# Patient Record
Sex: Male | Born: 1940
Health system: Southern US, Community
[De-identification: ages and names within clinical notes are randomized; demographics above are authoritative.]

## PROBLEM LIST (undated history)

## (undated) DIAGNOSIS — D126 Benign neoplasm of colon, unspecified: Secondary | ICD-10-CM

## (undated) DIAGNOSIS — C61 Malignant neoplasm of prostate: Secondary | ICD-10-CM

## (undated) DIAGNOSIS — I779 Disorder of arteries and arterioles, unspecified: Secondary | ICD-10-CM

## (undated) DIAGNOSIS — E785 Hyperlipidemia, unspecified: Secondary | ICD-10-CM

## (undated) DIAGNOSIS — M5030 Other cervical disc degeneration, unspecified cervical region: Secondary | ICD-10-CM

## (undated) DIAGNOSIS — I10 Essential (primary) hypertension: Secondary | ICD-10-CM

## (undated) DIAGNOSIS — M1990 Unspecified osteoarthritis, unspecified site: Secondary | ICD-10-CM

## (undated) DIAGNOSIS — M5136 Other intervertebral disc degeneration, lumbar region: Secondary | ICD-10-CM

## (undated) DIAGNOSIS — K5900 Constipation, unspecified: Secondary | ICD-10-CM

## (undated) DIAGNOSIS — J449 Chronic obstructive pulmonary disease, unspecified: Secondary | ICD-10-CM

## (undated) DIAGNOSIS — H3530 Unspecified macular degeneration: Secondary | ICD-10-CM

## (undated) DIAGNOSIS — I739 Peripheral vascular disease, unspecified: Secondary | ICD-10-CM

## (undated) DIAGNOSIS — C4491 Basal cell carcinoma of skin, unspecified: Secondary | ICD-10-CM

## (undated) DIAGNOSIS — M51369 Other intervertebral disc degeneration, lumbar region without mention of lumbar back pain or lower extremity pain: Secondary | ICD-10-CM

## (undated) DIAGNOSIS — C3490 Malignant neoplasm of unspecified part of unspecified bronchus or lung: Secondary | ICD-10-CM

## (undated) DIAGNOSIS — Z923 Personal history of irradiation: Secondary | ICD-10-CM

## (undated) DIAGNOSIS — T8859XA Other complications of anesthesia, initial encounter: Secondary | ICD-10-CM

## (undated) HISTORY — DX: Malignant neoplasm of unspecified part of unspecified bronchus or lung: C34.90

## (undated) HISTORY — DX: Disorder of arteries and arterioles, unspecified: I77.9

## (undated) HISTORY — DX: Basal cell carcinoma of skin, unspecified: C44.91

## (undated) HISTORY — PX: SKIN CANCER EXCISION: SHX779

## (undated) HISTORY — PX: TONSILLECTOMY: SUR1361

## (undated) HISTORY — DX: Hyperlipidemia, unspecified: E78.5

## (undated) HISTORY — PX: COLONOSCOPY: SHX174

## (undated) HISTORY — DX: Essential (primary) hypertension: I10

## (undated) HISTORY — PX: CATARACT EXTRACTION W/ INTRAOCULAR LENS  IMPLANT, BILATERAL: SHX1307

## (undated) HISTORY — PX: PROSTATE BIOPSY: SHX241

## (undated) HISTORY — DX: Peripheral vascular disease, unspecified: I73.9

## (undated) HISTORY — PX: NOSE SURGERY: SHX723

## (undated) HISTORY — DX: Benign neoplasm of colon, unspecified: D12.6

## (undated) HISTORY — DX: Unspecified macular degeneration: H35.30

---

## 2001-03-11 ENCOUNTER — Ambulatory Visit (HOSPITAL_COMMUNITY): Admission: RE | Admit: 2001-03-11 | Discharge: 2001-03-11 | Payer: Self-pay | Admitting: Internal Medicine

## 2001-04-10 ENCOUNTER — Ambulatory Visit (HOSPITAL_COMMUNITY): Admission: RE | Admit: 2001-04-10 | Discharge: 2001-04-10 | Payer: Self-pay | Admitting: Nephrology

## 2001-04-10 ENCOUNTER — Encounter: Payer: Self-pay | Admitting: Nephrology

## 2001-07-22 ENCOUNTER — Ambulatory Visit (HOSPITAL_COMMUNITY): Admission: RE | Admit: 2001-07-22 | Discharge: 2001-07-22 | Payer: Self-pay | Admitting: Ophthalmology

## 2001-12-11 ENCOUNTER — Other Ambulatory Visit: Admission: RE | Admit: 2001-12-11 | Discharge: 2001-12-11 | Payer: Self-pay | Admitting: Dermatology

## 2002-06-17 ENCOUNTER — Other Ambulatory Visit: Admission: RE | Admit: 2002-06-17 | Discharge: 2002-06-17 | Payer: Self-pay | Admitting: Dermatology

## 2002-08-20 ENCOUNTER — Encounter: Payer: Self-pay | Admitting: Otolaryngology

## 2002-08-20 ENCOUNTER — Ambulatory Visit (HOSPITAL_COMMUNITY): Admission: RE | Admit: 2002-08-20 | Discharge: 2002-08-20 | Payer: Self-pay | Admitting: Otolaryngology

## 2002-10-08 ENCOUNTER — Encounter: Payer: Self-pay | Admitting: Nephrology

## 2002-10-08 ENCOUNTER — Ambulatory Visit (HOSPITAL_COMMUNITY): Admission: RE | Admit: 2002-10-08 | Discharge: 2002-10-08 | Payer: Self-pay | Admitting: Nephrology

## 2003-11-01 ENCOUNTER — Other Ambulatory Visit: Admission: RE | Admit: 2003-11-01 | Discharge: 2003-11-01 | Payer: Self-pay | Admitting: Dermatology

## 2008-09-06 ENCOUNTER — Ambulatory Visit (HOSPITAL_COMMUNITY): Admission: RE | Admit: 2008-09-06 | Discharge: 2008-09-06 | Payer: Self-pay | Admitting: Internal Medicine

## 2009-09-09 ENCOUNTER — Ambulatory Visit (HOSPITAL_COMMUNITY): Admission: RE | Admit: 2009-09-09 | Discharge: 2009-09-09 | Payer: Self-pay | Admitting: Internal Medicine

## 2009-12-02 ENCOUNTER — Ambulatory Visit (HOSPITAL_COMMUNITY): Admission: RE | Admit: 2009-12-02 | Discharge: 2009-12-02 | Payer: Self-pay | Admitting: Internal Medicine

## 2010-04-17 ENCOUNTER — Encounter: Payer: Self-pay | Admitting: Internal Medicine

## 2013-04-20 DIAGNOSIS — Z09 Encounter for follow-up examination after completed treatment for conditions other than malignant neoplasm: Secondary | ICD-10-CM | POA: Diagnosis not present

## 2013-04-20 DIAGNOSIS — C3410 Malignant neoplasm of upper lobe, unspecified bronchus or lung: Secondary | ICD-10-CM | POA: Diagnosis not present

## 2013-04-20 DIAGNOSIS — C61 Malignant neoplasm of prostate: Secondary | ICD-10-CM | POA: Diagnosis not present

## 2013-04-23 DIAGNOSIS — C3410 Malignant neoplasm of upper lobe, unspecified bronchus or lung: Secondary | ICD-10-CM | POA: Diagnosis not present

## 2013-04-23 DIAGNOSIS — Z09 Encounter for follow-up examination after completed treatment for conditions other than malignant neoplasm: Secondary | ICD-10-CM | POA: Diagnosis not present

## 2013-04-30 DIAGNOSIS — C61 Malignant neoplasm of prostate: Secondary | ICD-10-CM | POA: Diagnosis not present

## 2013-06-11 DIAGNOSIS — C61 Malignant neoplasm of prostate: Secondary | ICD-10-CM | POA: Diagnosis not present

## 2013-07-16 DIAGNOSIS — C3490 Malignant neoplasm of unspecified part of unspecified bronchus or lung: Secondary | ICD-10-CM | POA: Diagnosis not present

## 2013-07-16 DIAGNOSIS — C61 Malignant neoplasm of prostate: Secondary | ICD-10-CM | POA: Diagnosis not present

## 2013-07-23 DIAGNOSIS — C3490 Malignant neoplasm of unspecified part of unspecified bronchus or lung: Secondary | ICD-10-CM | POA: Diagnosis not present

## 2013-07-23 DIAGNOSIS — Z87891 Personal history of nicotine dependence: Secondary | ICD-10-CM | POA: Diagnosis not present

## 2013-07-28 DIAGNOSIS — C3490 Malignant neoplasm of unspecified part of unspecified bronchus or lung: Secondary | ICD-10-CM | POA: Diagnosis not present

## 2013-10-15 DIAGNOSIS — C61 Malignant neoplasm of prostate: Secondary | ICD-10-CM | POA: Diagnosis not present

## 2013-10-27 DIAGNOSIS — C3490 Malignant neoplasm of unspecified part of unspecified bronchus or lung: Secondary | ICD-10-CM | POA: Diagnosis not present

## 2013-10-29 DIAGNOSIS — Z01818 Encounter for other preprocedural examination: Secondary | ICD-10-CM | POA: Diagnosis not present

## 2013-10-29 DIAGNOSIS — Z86010 Personal history of colonic polyps: Secondary | ICD-10-CM | POA: Diagnosis not present

## 2013-11-02 DIAGNOSIS — C3490 Malignant neoplasm of unspecified part of unspecified bronchus or lung: Secondary | ICD-10-CM | POA: Diagnosis not present

## 2013-11-02 DIAGNOSIS — J984 Other disorders of lung: Secondary | ICD-10-CM | POA: Diagnosis not present

## 2013-11-02 DIAGNOSIS — C61 Malignant neoplasm of prostate: Secondary | ICD-10-CM | POA: Diagnosis not present

## 2013-11-02 DIAGNOSIS — Z87891 Personal history of nicotine dependence: Secondary | ICD-10-CM | POA: Diagnosis not present

## 2013-11-05 DIAGNOSIS — Z09 Encounter for follow-up examination after completed treatment for conditions other than malignant neoplasm: Secondary | ICD-10-CM | POA: Diagnosis not present

## 2013-11-05 DIAGNOSIS — C3410 Malignant neoplasm of upper lobe, unspecified bronchus or lung: Secondary | ICD-10-CM | POA: Diagnosis not present

## 2013-12-04 DIAGNOSIS — Z86010 Personal history of colonic polyps: Secondary | ICD-10-CM | POA: Diagnosis not present

## 2013-12-04 DIAGNOSIS — K648 Other hemorrhoids: Secondary | ICD-10-CM | POA: Diagnosis not present

## 2013-12-04 DIAGNOSIS — D126 Benign neoplasm of colon, unspecified: Secondary | ICD-10-CM | POA: Diagnosis not present

## 2014-01-07 DIAGNOSIS — C61 Malignant neoplasm of prostate: Secondary | ICD-10-CM | POA: Diagnosis not present

## 2014-01-26 DIAGNOSIS — C3490 Malignant neoplasm of unspecified part of unspecified bronchus or lung: Secondary | ICD-10-CM | POA: Diagnosis not present

## 2014-01-26 DIAGNOSIS — C61 Malignant neoplasm of prostate: Secondary | ICD-10-CM | POA: Diagnosis not present

## 2014-02-08 DIAGNOSIS — M5412 Radiculopathy, cervical region: Secondary | ICD-10-CM | POA: Diagnosis not present

## 2014-02-08 DIAGNOSIS — M25511 Pain in right shoulder: Secondary | ICD-10-CM | POA: Diagnosis not present

## 2014-02-08 DIAGNOSIS — M7541 Impingement syndrome of right shoulder: Secondary | ICD-10-CM | POA: Diagnosis not present

## 2014-02-22 DIAGNOSIS — M7541 Impingement syndrome of right shoulder: Secondary | ICD-10-CM | POA: Diagnosis not present

## 2014-02-23 DIAGNOSIS — M545 Low back pain: Secondary | ICD-10-CM | POA: Diagnosis not present

## 2014-02-23 DIAGNOSIS — M4726 Other spondylosis with radiculopathy, lumbar region: Secondary | ICD-10-CM | POA: Diagnosis not present

## 2014-02-25 DIAGNOSIS — M5126 Other intervertebral disc displacement, lumbar region: Secondary | ICD-10-CM | POA: Diagnosis not present

## 2014-02-25 DIAGNOSIS — M545 Low back pain: Secondary | ICD-10-CM | POA: Diagnosis not present

## 2014-02-25 DIAGNOSIS — M9953 Intervertebral disc stenosis of neural canal of lumbar region: Secondary | ICD-10-CM | POA: Diagnosis not present

## 2014-02-25 DIAGNOSIS — M47816 Spondylosis without myelopathy or radiculopathy, lumbar region: Secondary | ICD-10-CM | POA: Diagnosis not present

## 2014-02-25 DIAGNOSIS — M5127 Other intervertebral disc displacement, lumbosacral region: Secondary | ICD-10-CM | POA: Diagnosis not present

## 2014-03-08 DIAGNOSIS — M4806 Spinal stenosis, lumbar region: Secondary | ICD-10-CM | POA: Diagnosis not present

## 2014-03-08 DIAGNOSIS — M5136 Other intervertebral disc degeneration, lumbar region: Secondary | ICD-10-CM | POA: Diagnosis not present

## 2014-03-08 DIAGNOSIS — M5416 Radiculopathy, lumbar region: Secondary | ICD-10-CM | POA: Diagnosis not present

## 2014-04-01 DIAGNOSIS — C61 Malignant neoplasm of prostate: Secondary | ICD-10-CM | POA: Diagnosis not present

## 2014-05-04 DIAGNOSIS — R609 Edema, unspecified: Secondary | ICD-10-CM | POA: Diagnosis not present

## 2014-05-04 DIAGNOSIS — I10 Essential (primary) hypertension: Secondary | ICD-10-CM | POA: Diagnosis not present

## 2014-05-04 DIAGNOSIS — I5040 Unspecified combined systolic (congestive) and diastolic (congestive) heart failure: Secondary | ICD-10-CM | POA: Diagnosis not present

## 2014-05-10 DIAGNOSIS — J449 Chronic obstructive pulmonary disease, unspecified: Secondary | ICD-10-CM | POA: Diagnosis not present

## 2014-05-10 DIAGNOSIS — C3490 Malignant neoplasm of unspecified part of unspecified bronchus or lung: Secondary | ICD-10-CM | POA: Diagnosis not present

## 2014-05-12 DIAGNOSIS — Z85118 Personal history of other malignant neoplasm of bronchus and lung: Secondary | ICD-10-CM | POA: Diagnosis not present

## 2014-05-12 DIAGNOSIS — J449 Chronic obstructive pulmonary disease, unspecified: Secondary | ICD-10-CM | POA: Diagnosis not present

## 2014-05-12 DIAGNOSIS — I1310 Hypertensive heart and chronic kidney disease without heart failure, with stage 1 through stage 4 chronic kidney disease, or unspecified chronic kidney disease: Secondary | ICD-10-CM | POA: Diagnosis not present

## 2014-05-12 DIAGNOSIS — R600 Localized edema: Secondary | ICD-10-CM | POA: Diagnosis not present

## 2014-05-12 DIAGNOSIS — Z720 Tobacco use: Secondary | ICD-10-CM | POA: Diagnosis not present

## 2014-05-18 DIAGNOSIS — C61 Malignant neoplasm of prostate: Secondary | ICD-10-CM | POA: Diagnosis not present

## 2014-05-26 DIAGNOSIS — Z7982 Long term (current) use of aspirin: Secondary | ICD-10-CM | POA: Diagnosis not present

## 2014-05-26 DIAGNOSIS — Z452 Encounter for adjustment and management of vascular access device: Secondary | ICD-10-CM | POA: Diagnosis not present

## 2014-05-26 DIAGNOSIS — Z85118 Personal history of other malignant neoplasm of bronchus and lung: Secondary | ICD-10-CM | POA: Diagnosis not present

## 2014-05-26 DIAGNOSIS — Z87891 Personal history of nicotine dependence: Secondary | ICD-10-CM | POA: Diagnosis not present

## 2014-05-26 DIAGNOSIS — Z9221 Personal history of antineoplastic chemotherapy: Secondary | ICD-10-CM | POA: Diagnosis not present

## 2014-05-26 DIAGNOSIS — K7689 Other specified diseases of liver: Secondary | ICD-10-CM | POA: Diagnosis not present

## 2014-05-27 DIAGNOSIS — C3411 Malignant neoplasm of upper lobe, right bronchus or lung: Secondary | ICD-10-CM | POA: Diagnosis not present

## 2014-11-16 DIAGNOSIS — C61 Malignant neoplasm of prostate: Secondary | ICD-10-CM | POA: Diagnosis not present

## 2014-11-16 DIAGNOSIS — C3490 Malignant neoplasm of unspecified part of unspecified bronchus or lung: Secondary | ICD-10-CM | POA: Diagnosis not present

## 2014-11-23 DIAGNOSIS — Z85828 Personal history of other malignant neoplasm of skin: Secondary | ICD-10-CM | POA: Diagnosis not present

## 2014-11-23 DIAGNOSIS — X32XXXA Exposure to sunlight, initial encounter: Secondary | ICD-10-CM | POA: Diagnosis not present

## 2014-11-23 DIAGNOSIS — D485 Neoplasm of uncertain behavior of skin: Secondary | ICD-10-CM | POA: Diagnosis not present

## 2014-11-23 DIAGNOSIS — C44321 Squamous cell carcinoma of skin of nose: Secondary | ICD-10-CM | POA: Diagnosis not present

## 2014-11-23 DIAGNOSIS — L821 Other seborrheic keratosis: Secondary | ICD-10-CM | POA: Diagnosis not present

## 2014-11-23 DIAGNOSIS — F17200 Nicotine dependence, unspecified, uncomplicated: Secondary | ICD-10-CM | POA: Diagnosis not present

## 2014-11-23 DIAGNOSIS — L570 Actinic keratosis: Secondary | ICD-10-CM | POA: Diagnosis not present

## 2014-11-23 DIAGNOSIS — Z08 Encounter for follow-up examination after completed treatment for malignant neoplasm: Secondary | ICD-10-CM | POA: Diagnosis not present

## 2014-11-30 DIAGNOSIS — R600 Localized edema: Secondary | ICD-10-CM | POA: Diagnosis not present

## 2014-11-30 DIAGNOSIS — N182 Chronic kidney disease, stage 2 (mild): Secondary | ICD-10-CM | POA: Diagnosis not present

## 2014-11-30 DIAGNOSIS — I5040 Unspecified combined systolic (congestive) and diastolic (congestive) heart failure: Secondary | ICD-10-CM | POA: Diagnosis not present

## 2014-11-30 DIAGNOSIS — R7302 Impaired glucose tolerance (oral): Secondary | ICD-10-CM | POA: Diagnosis not present

## 2014-11-30 DIAGNOSIS — I10 Essential (primary) hypertension: Secondary | ICD-10-CM | POA: Diagnosis not present

## 2014-11-30 DIAGNOSIS — R7301 Impaired fasting glucose: Secondary | ICD-10-CM | POA: Diagnosis not present

## 2014-12-14 DIAGNOSIS — R600 Localized edema: Secondary | ICD-10-CM | POA: Diagnosis not present

## 2014-12-30 DIAGNOSIS — C44321 Squamous cell carcinoma of skin of nose: Secondary | ICD-10-CM | POA: Diagnosis not present

## 2015-01-03 DIAGNOSIS — C44321 Squamous cell carcinoma of skin of nose: Secondary | ICD-10-CM | POA: Diagnosis not present

## 2015-01-04 DIAGNOSIS — C44321 Squamous cell carcinoma of skin of nose: Secondary | ICD-10-CM | POA: Diagnosis not present

## 2015-01-19 DIAGNOSIS — C44529 Squamous cell carcinoma of skin of other part of trunk: Secondary | ICD-10-CM | POA: Diagnosis not present

## 2015-01-19 DIAGNOSIS — L570 Actinic keratosis: Secondary | ICD-10-CM | POA: Diagnosis not present

## 2015-01-19 DIAGNOSIS — Z4802 Encounter for removal of sutures: Secondary | ICD-10-CM | POA: Diagnosis not present

## 2015-01-19 DIAGNOSIS — T8131XA Disruption of external operation (surgical) wound, not elsewhere classified, initial encounter: Secondary | ICD-10-CM | POA: Diagnosis not present

## 2015-01-19 DIAGNOSIS — D485 Neoplasm of uncertain behavior of skin: Secondary | ICD-10-CM | POA: Diagnosis not present

## 2015-02-01 DIAGNOSIS — C44529 Squamous cell carcinoma of skin of other part of trunk: Secondary | ICD-10-CM | POA: Diagnosis not present

## 2015-05-19 DIAGNOSIS — Z87891 Personal history of nicotine dependence: Secondary | ICD-10-CM | POA: Diagnosis not present

## 2015-05-19 DIAGNOSIS — C3490 Malignant neoplasm of unspecified part of unspecified bronchus or lung: Secondary | ICD-10-CM | POA: Diagnosis not present

## 2015-05-19 DIAGNOSIS — Z8546 Personal history of malignant neoplasm of prostate: Secondary | ICD-10-CM | POA: Diagnosis not present

## 2015-05-19 DIAGNOSIS — R918 Other nonspecific abnormal finding of lung field: Secondary | ICD-10-CM | POA: Diagnosis not present

## 2015-05-23 DIAGNOSIS — X32XXXA Exposure to sunlight, initial encounter: Secondary | ICD-10-CM | POA: Diagnosis not present

## 2015-05-23 DIAGNOSIS — F17200 Nicotine dependence, unspecified, uncomplicated: Secondary | ICD-10-CM | POA: Diagnosis not present

## 2015-05-23 DIAGNOSIS — Z85828 Personal history of other malignant neoplasm of skin: Secondary | ICD-10-CM | POA: Diagnosis not present

## 2015-05-23 DIAGNOSIS — L570 Actinic keratosis: Secondary | ICD-10-CM | POA: Diagnosis not present

## 2015-05-23 DIAGNOSIS — Z08 Encounter for follow-up examination after completed treatment for malignant neoplasm: Secondary | ICD-10-CM | POA: Diagnosis not present

## 2015-05-23 DIAGNOSIS — L821 Other seborrheic keratosis: Secondary | ICD-10-CM | POA: Diagnosis not present

## 2015-05-23 DIAGNOSIS — C4441 Basal cell carcinoma of skin of scalp and neck: Secondary | ICD-10-CM | POA: Diagnosis not present

## 2015-05-23 DIAGNOSIS — D485 Neoplasm of uncertain behavior of skin: Secondary | ICD-10-CM | POA: Diagnosis not present

## 2015-05-24 DIAGNOSIS — C61 Malignant neoplasm of prostate: Secondary | ICD-10-CM | POA: Diagnosis not present

## 2015-05-24 DIAGNOSIS — C3490 Malignant neoplasm of unspecified part of unspecified bronchus or lung: Secondary | ICD-10-CM | POA: Diagnosis not present

## 2015-06-09 DIAGNOSIS — C3411 Malignant neoplasm of upper lobe, right bronchus or lung: Secondary | ICD-10-CM | POA: Diagnosis not present

## 2015-06-14 DIAGNOSIS — C4441 Basal cell carcinoma of skin of scalp and neck: Secondary | ICD-10-CM | POA: Diagnosis not present

## 2015-06-15 DIAGNOSIS — C4441 Basal cell carcinoma of skin of scalp and neck: Secondary | ICD-10-CM | POA: Diagnosis not present

## 2015-06-27 DIAGNOSIS — X32XXXD Exposure to sunlight, subsequent encounter: Secondary | ICD-10-CM | POA: Diagnosis not present

## 2015-06-27 DIAGNOSIS — L570 Actinic keratosis: Secondary | ICD-10-CM | POA: Diagnosis not present

## 2015-07-13 DIAGNOSIS — Z720 Tobacco use: Secondary | ICD-10-CM | POA: Diagnosis not present

## 2015-07-13 DIAGNOSIS — C61 Malignant neoplasm of prostate: Secondary | ICD-10-CM | POA: Diagnosis not present

## 2015-07-13 DIAGNOSIS — I10 Essential (primary) hypertension: Secondary | ICD-10-CM | POA: Diagnosis not present

## 2015-07-13 DIAGNOSIS — D0220 Carcinoma in situ of unspecified bronchus and lung: Secondary | ICD-10-CM | POA: Diagnosis not present

## 2015-07-13 DIAGNOSIS — H40059 Ocular hypertension, unspecified eye: Secondary | ICD-10-CM | POA: Diagnosis not present

## 2015-07-13 DIAGNOSIS — Z85828 Personal history of other malignant neoplasm of skin: Secondary | ICD-10-CM | POA: Diagnosis not present

## 2015-07-13 DIAGNOSIS — G453 Amaurosis fugax: Secondary | ICD-10-CM | POA: Diagnosis not present

## 2015-07-15 ENCOUNTER — Encounter (HOSPITAL_COMMUNITY): Payer: Self-pay | Admitting: Oncology

## 2015-07-15 ENCOUNTER — Other Ambulatory Visit (HOSPITAL_COMMUNITY): Payer: Self-pay | Admitting: Oncology

## 2015-07-15 DIAGNOSIS — C3490 Malignant neoplasm of unspecified part of unspecified bronchus or lung: Secondary | ICD-10-CM

## 2015-07-15 DIAGNOSIS — C3491 Malignant neoplasm of unspecified part of right bronchus or lung: Secondary | ICD-10-CM

## 2015-07-15 HISTORY — DX: Malignant neoplasm of unspecified part of unspecified bronchus or lung: C34.90

## 2015-07-22 ENCOUNTER — Encounter: Payer: Self-pay | Admitting: Family Medicine

## 2015-07-25 DIAGNOSIS — E782 Mixed hyperlipidemia: Secondary | ICD-10-CM | POA: Diagnosis not present

## 2015-07-25 DIAGNOSIS — I10 Essential (primary) hypertension: Secondary | ICD-10-CM | POA: Diagnosis not present

## 2015-07-25 DIAGNOSIS — E039 Hypothyroidism, unspecified: Secondary | ICD-10-CM | POA: Diagnosis not present

## 2015-07-25 DIAGNOSIS — Z125 Encounter for screening for malignant neoplasm of prostate: Secondary | ICD-10-CM | POA: Diagnosis not present

## 2015-07-27 DIAGNOSIS — G453 Amaurosis fugax: Secondary | ICD-10-CM | POA: Diagnosis not present

## 2015-07-27 DIAGNOSIS — C61 Malignant neoplasm of prostate: Secondary | ICD-10-CM | POA: Diagnosis not present

## 2015-07-27 DIAGNOSIS — I10 Essential (primary) hypertension: Secondary | ICD-10-CM | POA: Diagnosis not present

## 2015-07-28 ENCOUNTER — Encounter: Payer: Self-pay | Admitting: Vascular Surgery

## 2015-08-02 ENCOUNTER — Encounter: Payer: Self-pay | Admitting: Vascular Surgery

## 2015-08-02 ENCOUNTER — Ambulatory Visit (INDEPENDENT_AMBULATORY_CARE_PROVIDER_SITE_OTHER): Payer: Medicare Other | Admitting: Vascular Surgery

## 2015-08-02 VITALS — BP 140/83 | HR 83 | Temp 97.5°F | Resp 18 | Ht 66.0 in | Wt 195.0 lb

## 2015-08-02 DIAGNOSIS — G453 Amaurosis fugax: Secondary | ICD-10-CM | POA: Diagnosis not present

## 2015-08-02 DIAGNOSIS — I6523 Occlusion and stenosis of bilateral carotid arteries: Secondary | ICD-10-CM | POA: Diagnosis not present

## 2015-08-02 DIAGNOSIS — H4311 Vitreous hemorrhage, right eye: Secondary | ICD-10-CM | POA: Diagnosis not present

## 2015-08-02 NOTE — Progress Notes (Signed)
Vascular and Vein Specialist of [facility]  Patient name: Eric Hinton MRN: [id number] DOB: 07-30-1940 Sex: male  REASON FOR CONSULT: Lateral carotid stenosis with right eye amaurosis fugax  HPI: Eric Hinton is a 75 y.o. male, who is seen today for evaluation of carotid stenosis. He is in a very pleasant woman here today with his wife. He spends part of the year in [place]. He noted over the last 6 months to have 7 specific episodes of visual changes in his right eye. He reports that the most significant was the first event. He was mowing his grass and noted visual changes. He had difficulty even sitting a mirror when he looked and to see if there was something in his eye. This resolved completely. He saw ophthalmologist in [place] at the [place] who him for a carotid duplex. I have the results of this. Did show moderate right and moderate to severe left carotid stenosis. He specifically denies any focal weakness. He is left-handed. He has no history of cardiac disease. He does have elevated lipids and was recently started on lipid-lowering medication. He had been on a baby aspirin per day and was increased to a full 325 mg aspirin per day after these events started. He reports that he was started on eyedrops on April 4 and has had one mild event since that time.  Past Medical History  Diagnosis Date  . H/O Squamous cell lung cancer ([place]) 07/15/2015  . Hypertension   . Hyperlipidemia     Family History  Problem Relation Age of Onset  . Cancer Mother     cancer  . Stroke Father   . Heart disease Father     SOCIAL HISTORY: Social History   Social History  . Marital Status: Married    Spouse Name: N/A  . Number of Children: N/A  . Years of Education: N/A   Occupational History  . Not on file.   Social History Main Topics  . Smoking status: Current Every Day Smoker    Types: Cigarettes  . Smokeless tobacco: Never Used  . Alcohol Use: 0.0 oz/week    0 Standard drinks or  equivalent per week  . Drug Use: No  . Sexual Activity: Not on file   Other Topics Concern  . Not on file   Social History Narrative    Allergies  Allergen Reactions  . Wellbutrin [Bupropion] Hives    Current Outpatient Prescriptions  Medication Sig Dispense Refill  . aspirin 325 MG tablet Take 325 mg by mouth daily.    Marland Kitchen atorvastatin (LIPITOR) 10 MG tablet Take 10 mg by mouth daily.    . Cyanocobalamin (VITAMIN B-12) 2500 MCG SUBL Place 2,500 mcg under the tongue daily.    . valsartan (DIOVAN) 320 MG tablet Take 320 mg by mouth daily.     No current facility-administered medications for this visit.    REVIEW OF SYSTEMS:  '[X]'$  denotes positive finding, '[ ]'$  denotes negative finding Cardiac  Comments:  Chest pain or chest pressure:    Shortness of breath upon exertion:    Short of breath when lying flat:    Irregular heart rhythm:        Vascular    Pain in calf, thigh, or hip brought on by ambulation: x   Pain in feet at night that wakes you up from your sleep:     Blood clot in your veins:    Leg swelling:  x  Pulmonary    Oxygen at home:    Productive cough:     Wheezing:         Neurologic    Sudden weakness in arms or legs:     Sudden numbness in arms or legs:     Sudden onset of difficulty speaking or slurred speech:    Temporary loss of vision in one eye:  x   Problems with dizziness:         Gastrointestinal    Blood in stool:     Vomited blood:         Genitourinary    Burning when urinating:     Blood in urine:        Psychiatric    Major depression:         Hematologic    Bleeding problems:    Problems with blood clotting too easily:        Skin    Rashes or ulcers:        Constitutional    Fever or chills:      PHYSICAL EXAM: Filed Vitals:   08/02/15 0942 08/02/15 0943  BP: 139/80 140/83  Pulse: 81 83  Temp: 97.5 F (36.4 C)   Resp: 18   Height: '5\' 6"'$  (1.676 m)   Weight: 195 lb (88.451 kg)   SpO2: 98%     GENERAL: The  patient is a well-nourished male, in no acute distress. The vital signs are documented above. CARDIAC: There is a regular rate and rhythm.  VASCULAR: Carotid arteries without bruits bilaterally. 2+ radial and 2+ dorsalis pedis pulses bilaterally PULMONARY: There is good air exchange bilaterally without wheezing or rales. ABDOMEN: Soft and non-tender with normal pitched bowel sounds.  MUSCULOSKELETAL: There are no major deformities or cyanosis. NEUROLOGIC: No focal weakness or paresthesias are detected. SKIN: There are no ulcers or rashes noted. PSYCHIATRIC: The patient has a normal affect.  DATA:  Carotid duplex from [place] from 05/03/2015 was reviewed. This reveals elevated carotid velocities of 588 systolic and 32 diastolic cm/s. On the left velocities are 325 and diastolic velocities are 498  MEDICAL ISSUES: Had long discussion with the patient and his wife present. Explained the concern regarding potential carotid source for his right eye visual changes. Also explained that since he is left-handed his right brain is his dominant hemisphere. Explained that his right carotid duplex in our lab would be more in line with a 50% stenosis. Explained that he may have a more proximal source and would recommend a CT angiogram of his neck for further evaluation. Explained that he has much more elevated velocities which is asymptomatic in his left carotid. Will need to evaluate this as well and may discuss endarterectomy for asymptomatic disease. He will obtain a CT angiogram within the next 1-2 weeks and see me back in the office for further discussion.   Curt Jews V[facility] and Vein Specialists of [place]: [phone]

## 2015-08-03 ENCOUNTER — Other Ambulatory Visit: Payer: Self-pay | Admitting: *Deleted

## 2015-08-03 DIAGNOSIS — I6523 Occlusion and stenosis of bilateral carotid arteries: Secondary | ICD-10-CM

## 2015-08-04 ENCOUNTER — Encounter: Payer: Self-pay | Admitting: Internal Medicine

## 2015-08-04 ENCOUNTER — Telehealth: Payer: Self-pay | Admitting: Vascular Surgery

## 2015-08-04 NOTE — Telephone Encounter (Signed)
Gave wife the following: CTA 08/10/15 2pm [place] [facility] No solids 4 hrs prior. Follow up with Dr. Donnetta Hutching 08/23/15 11:30 am She verbalized understanding.

## 2015-08-05 ENCOUNTER — Other Ambulatory Visit: Payer: Self-pay

## 2015-08-05 DIAGNOSIS — G453 Amaurosis fugax: Secondary | ICD-10-CM

## 2015-08-05 DIAGNOSIS — I6523 Occlusion and stenosis of bilateral carotid arteries: Secondary | ICD-10-CM

## 2015-08-09 ENCOUNTER — Ambulatory Visit (HOSPITAL_COMMUNITY): Payer: Self-pay | Admitting: Hematology & Oncology

## 2015-08-09 ENCOUNTER — Telehealth: Payer: Self-pay

## 2015-08-09 NOTE — Telephone Encounter (Signed)
CTA neck was rescheduled to 5/24 @ 10:00 am due to order not being signed by TFE in EPIC. Loma Sousa from [facility] radiology scheduling called to inform, she also rescheduled the patient. Patient was called with new appointmentt day and was fine with the change.

## 2015-08-10 ENCOUNTER — Ambulatory Visit (HOSPITAL_COMMUNITY): Payer: Medicare Other

## 2015-08-10 ENCOUNTER — Encounter (HOSPITAL_COMMUNITY): Payer: Self-pay | Admitting: Oncology

## 2015-08-10 ENCOUNTER — Encounter (HOSPITAL_COMMUNITY): Payer: Medicare Other | Attending: Oncology | Admitting: Oncology

## 2015-08-10 VITALS — BP 149/63 | HR 80 | Temp 98.2°F | Resp 20 | Ht 65.5 in | Wt 198.0 lb

## 2015-08-10 DIAGNOSIS — C771 Secondary and unspecified malignant neoplasm of intrathoracic lymph nodes: Secondary | ICD-10-CM | POA: Diagnosis not present

## 2015-08-10 DIAGNOSIS — C3491 Malignant neoplasm of unspecified part of right bronchus or lung: Secondary | ICD-10-CM

## 2015-08-10 DIAGNOSIS — Z720 Tobacco use: Secondary | ICD-10-CM

## 2015-08-10 NOTE — Patient Instructions (Signed)
[place] at [facility] Discharge Instructions  RECOMMENDATIONS MADE BY THE CONSULTANT AND ANY TEST RESULTS WILL BE SENT TO YOUR REFERRING PHYSICIAN.  CT scan and follow up with Dr. Whitney Muse after CT scan  Thank you for choosing [place] at [facility] to provide your oncology and hematology care.  To afford each patient quality time with our provider, please arrive at least 15 minutes before your scheduled appointment time.   Beginning January 23rd 2017 lab work for the [facility] will be done in the  Main lab at [facility] on 1st floor. If you have a lab appointment with the [facility] please come in thru the  Main Entrance and check in at the main information desk  You need to re-schedule your appointment should you arrive 10 or more minutes late.  We strive to give you quality time with our providers, and arriving late affects you and other patients whose appointments are after yours.  Also, if you no show three or more times for appointments you may be dismissed from the clinic at the providers discretion.     Again, thank you for choosing [facility].  Our hope is that these requests will decrease the amount of time that you wait before being seen by our physicians.       _____________________________________________________________  Should you have questions after your visit to [facility], please contact our office at [phone] between the hours of 8:30 a.m. and 4:30 p.m.  Voicemails left after 4:30 p.m. will not be returned until the following business day.  For prescription refill requests, have your pharmacy contact our office.         Resources For Cancer Patients and their Caregivers ? American Cancer Society: Can assist with transportation, wigs, general needs, runs Look Good Feel Better.        (757)258-4138 ? Cancer Care: Provides financial assistance, online support groups,  medication/co-pay assistance.  1-800-813-HOPE 754 062 9417) ? [place] Assists [place] cancer patients and their families through emotional , educational and financial support.  (443)330-4743 ? [place] DSS Where to apply for food stamps, Medicaid and utility assistance. (231)762-6364 ? [facility]: Transportation to medical appointments. 405-016-8663 ? Social Security Administration: May apply for disability if have a Stage IV cancer. 725-617-8569 640-612-9735 ? [employer], Disability and Transit Services: Assists with nutrition, care and transit needs. [place] Support Programs: '@10RELATIVEDAYS'$ @ > Cancer Support Group  2nd Tuesday of the month 1pm-2pm, Journey Room  > Creative Journey  3rd Tuesday of the month 1130am-1pm, Journey Room  > Look Good Feel Better  1st Wednesday of the month 10am-12 noon, Journey Room (Call [place] to register [phone])

## 2015-08-10 NOTE — Progress Notes (Signed)
[facility] Hematology/Oncology Consultation   Name: Eric Hinton      MRN: [id number]   Date: 08/10/2015 Time:3:01 PM   REFERRING PHYSICIAN:  Wende Neighbors, MD (Primary Care Provider)  REASON FOR CONSULT:  Establishment of oncology care   DIAGNOSIS:  History of poorly differentiated squamous cell carcinoma diagnosed and treated in [place], clinically Stage IIIA.  HISTORY OF PRESENT ILLNESS:   Eric Hinton is a 75 y.o. male with a medical history significant for HTN, carotid artery stenosis, and H/O poorly differentiated squamous cell carcinoma of lung (TXN2M0) who is referred to the [facility] for establishment of medical oncology care.  Oncology History   H/O Stage IIIA poorly differentiated squamous cell carcinoma of right lung (TXN2M0) treated with concurrent chemoradiation consisting of weekly Carbo/Taxo (02/28/2012- 4/96/7591) complicated by hospitalizations (2) and issues with thrombocytopenia resulting in completing 6/8 cycles of chemotherapy.  He underwent XRT from 02/25/2012- 04/29/2012.     H/O Squamous cell lung cancer ([place])   12/03/2011 PET scan Persistent focal uptake in the rectoanal bowel, benign inflammatory possible neoplastic process cannot be excluded. Multiple enlarging hypermetabolic mediastinal lymph nodes concerning for malignancy. RML density favoring benign etiology   12/13/2011 Procedure Colonoscopy found tubular adenomas at 75 cm and 40 cm   01/14/2012 Imaging CT chest-no significant interval change in mediastinal lymphadenopathy when compared to 11/2011.  Spiculated opacity in the right upper lobe not significantly changed from 11/2011. Stable RML noncalcified on her nodules. Diffuse emphysema.   02/08/2012 Procedure Bronchoscopy revealing chronic bronchitis, multiple mucous plugs are present and lavaged out from the lung. Needle aspiration of mediastinal lymph node performed.   02/08/2012 Pathology Results Needle aspiration of  mediastinal lymph node-Poorly differentiated squamous cell carcinoma   02/19/2012 Imaging MRI brain-negative   02/19/2012 Cancer Staging Clinically Stage IIIA   02/25/2012 - 04/29/2012 Radiation Therapy Dr. Earle Gell   02/28/2012 - 04/17/2012 Chemotherapy Weekly Carbo/Taxol requiring dose reduction due to thrombocytopenia/hospitalization.   03/27/2012 Treatment Plan Change Treatment held due to thrombocytopenia/hospitalization   04/10/2012 Treatment Plan Change Treatment held due to thrombocytopenia/hospitalization   04/17/2012 Treatment Plan Change Patient refused any further chemotherapy.   05/19/2015 Imaging CT chest- Mild interval increase in the prominence of chronic irregular densities in the anterior right mid lung. Most compatible with areas of scarring and atelectasis. Recommend CT follow-up in 3-6 months. Mildly prominent right hilar lymph nodes.   I personally reviewed and went over laboratory results with the patient.  The results are noted within this dictation. We will update labs today.  I personally reviewed and went over radiographic studies with the patient.  The results are noted within this dictation.   Most recent CT of the chest performed on 05/19/2015 demonstrates mild interval increase in the prominence of chronic irregular densities in the anterior right middle lobe of lung. Most compatible with areas of scarring and atelectasis but is recommended to perform a CT of chest in 3-6 months. Also noted mildly prominent right hilar lymph nodes.  Provided paper chart reviewed in detail providing the above oncology history.  His treatment course was complicated by hospitalization the day following his 5th cycle with ICU admission.  He denies any during transfusion reactions.  His 6th cycle was also complicated by a hospitalization resulting in early discontinuation of chemotherapy treatment.  He did complete ~ 8 weeks worth of XRT.  He completed 6/8 cycles of weekly Carboplatin/Taxol.  Our  encounter started off on the  wrong foot.  I was ~ 35 minutes late and upon my entrance into the exam room, Mr. Kobler checked his watch and then proceeded to lecture me about punctuality.  He admits that he is very impatient and has no tolerance for tardiness.  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  I sternly proceeded to educate him that unfortunately patients with cancer sometime have difficulties during treatment, much like he experienced.  When he had issues during treatment, I hope his treating providers took time out of their schedule to provide him appropriate care that I am sure he is grateful of.  That same patient care continues and is provided to other patients and therefore, sometimes, we all get behind schedule.  Even though this happens, it is not planned, nor is it a goal of mine because when I am late seeing patients, that means my work day is extended.  Like him, I have family and other hobbies outside of work that I like to participate in.  Therefore, he is advised that I do not appreciate his attitude, nor his disrespect.  He is offered a transfer of medical oncology care at this time.  "Can we start over?" he replies.  The remaining time during our visit today was much improved and pleasant.  The patient reports that he required sequential colonoscopies in [place], last one in 11/2013 due to multiple polyps requiring polypectomy. He reports that he underwent 5 sequential colonoscopies. He will need to be established with GI in the future for further recommendations and continued GI surveillance as recommended. I do not have colonoscopy or pathology reports related to this particular issue.  Surprisingly, the patient continues to smoke approximately 2 packs per day. He notes he used to smoke up to 3 packs per day of cigarettes. He is a significant smoking  history smoking 69 years.  He notes that he was in [employer], retiring in 1979.  I thanked him for his service.  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  His weight is stable.  He denies any hemoptysis, but notes a chronic cough.  He denies any sputum production.  Review of Systems  Constitutional: Negative for fever, chills, weight loss and malaise/fatigue.  HENT: Negative for congestion and sore throat.   Eyes: Negative for blurred vision and double vision.  Respiratory: Positive for cough (chronic). Negative for hemoptysis and wheezing.   Cardiovascular: Negative for chest pain, palpitations and leg swelling.  Gastrointestinal: Negative for heartburn, nausea, vomiting, abdominal pain, diarrhea, constipation, blood in stool and melena.  Genitourinary: Negative for dysuria, urgency, frequency and hematuria.  Musculoskeletal: Negative for falls.  Skin: Negative.   Neurological: Negative.  Negative for weakness and headaches.  Endo/Heme/Allergies: Negative.   Psychiatric/Behavioral: Negative.   14 point review of systems was performed and is negative except as detailed under history of present illness and above   PAST MEDICAL HISTORY:   Past Medical History  Diagnosis Date  . H/O Squamous cell lung cancer ([place]) 07/15/2015  . Hypertension   . Hyperlipidemia     ALLERGIES: Allergies  Allergen Reactions  . Wellbutrin [Bupropion] Hives      MEDICATIONS: I have reviewed the  patient's current medications.    Current Outpatient Prescriptions on File Prior to Visit  Medication Sig Dispense Refill  . aspirin 325 MG tablet Take 325 mg by mouth daily.    Marland Kitchen atorvastatin (LIPITOR) 10 MG tablet Take 10 mg by mouth daily.    . Cyanocobalamin (VITAMIN B-12) 2500 MCG SUBL Place 2,500 mcg under the tongue daily.    .  valsartan (DIOVAN) 320 MG tablet Take 320 mg by mouth daily.     No current facility-administered medications on file prior to visit.     PAST SURGICAL HISTORY Past Surgical History  Procedure Laterality Date  . Tonsillectomy    . Neck surgery Right     skin cancer  . Eye surgery Bilateral   . Nose surgery      skin cancer  . Colonoscopy      FAMILY HISTORY: Family History  Problem Relation Age of Onset  . Cancer Mother     cancer  . Stroke Father   . Heart disease Father     SOCIAL HISTORY:  reports that he has been smoking Cigarettes.  He has never used smokeless tobacco. He reports that he drinks alcohol. He reports that he does not use illicit drugs.  Social History   Social History  . Marital Status: Married    Spouse Name: N/A  . Number of Children: N/A  . Years of Education: N/A   Social History Main Topics  . Smoking status: Current Every Day Smoker    Types: Cigarettes  . Smokeless tobacco: Never Used  . Alcohol Use: 0.0 oz/week    0 Standard drinks or equivalent per week  . Drug Use: No  . Sexual Activity: Not Asked   Other Topics Concern  . None   Social History Narrative    PERFORMANCE STATUS: The patient's performance status is 1 - Symptomatic but completely ambulatory  PHYSICAL EXAM: Most Recent Vital Signs: Blood pressure 149/63, pulse 80, temperature 98.2 F (36.8 C), temperature source Oral, resp. rate 20, height 5' 5.5" (1.664 m), weight 198 lb (89.812 kg), SpO2 97 %. General appearance: alert, appears older than stated age, no distress, moderately obese and chronically ill appearing, upset with my 35 minute tardiness, accompanied by his wife, [name]. Head: Normocephalic, without obvious abnormality, atraumatic Eyes: negative findings: lids and lashes normal, conjunctivae and sclerae normal and corneas clear Throat: normal findings: lips normal without lesions, tongue midline and normal and oropharynx pink & moist without lesions or  evidence of thrush and abnormal findings: dentition: poor and halitosis Neck: no adenopathy, supple, symmetrical, trachea midline and thyroid not enlarged, symmetric, no tenderness/mass/nodules Lungs: diminished breath sounds bilaterally Heart: regular rate and rhythm, S1, S2 normal, no murmur, click, rub or gallop Abdomen: soft, non-tender; bowel sounds normal; no masses,  no organomegaly Extremities: extremities normal, atraumatic, no cyanosis or edema Skin: Skin color, texture, turgor normal. No rashes or lesions Lymph nodes: Cervical, supraclavicular, and axillary nodes normal. Neurologic: Grossly normal  LABORATORY DATA:  Labs performed on 07/25/2015 by the patient's primary care provider demonstrate: WBC: 4.7 Hemoglobin: 14.4 g/dL Hematocrit: 42.0% Platelet count 223,000 BUN: 19 Creatinine: 1.06 AST: 12 ALT: 13 Alkaline phosphatase: 55 Total bilirubin: 0.6 TSH: 2.09 PSA: 0.11  RADIOGRAPHY: No results found.     PATHOLOGY:    ASSESSMENT/PLAN:   H/O Squamous cell lung cancer ([place]) Tobacco Abuse  H/O Stage IIIA poorly differentiated squamous cell carcinoma of right lung (JSE8B1) treated with concurrent chemoradiation consisting of weekly Carbo/Taxo (02/28/2012- 08/10/6158) complicated  by hospitalizations (2) and issues with thrombocytopenia resulting in completing 6/8 cycles of chemotherapy.  He underwent XRT from 02/25/2012- 04/29/2012.  Oncology history developed.  Staging in [facility] problem list is completed to the most accurate of information I have available.  Our encounter started off poorly.  I was ~ 35 minutes late and upon my entrance into the exam room, Mr. Dimaria checked his watch and then proceeded to lecture me about punctuality.  He admits that he is very impatient and has no tolerance for tardiness.  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  I sternly proceeded to educate him that unfortunately patients with cancer sometime have difficulties during treatment, much like he experienced.  When he had issues during treatment, I hope his treating providers took time out of their schedule to provide him appropriate care that I am sure he is grateful of.  That same patient care continues and is provided to other patients and therefore, sometimes, we all get behind schedule.  Even though this happens, it is not planned, nor is it a goal of mine because when I am late seeing patients, that means my work day is extended.  Like him, I have family and other hobbies outside of work that I like to participate in.  Therefore, he is advised that I do not appreciate his attitude, nor his disrespect.  He is offered a transfer of medical oncology care at this time.  "Can we start over?" he replies.  The remaining time during our visit today was much improved and pleasant.  He has a history of sequential colonoscopies in [place] for polypectomies.  I see documentation in his paper chart that his last colonoscopy was 11/2013.  He will need establishment with GI locally for continued GI care and colonoscopies when indicated.  We will try to get labs from Dr. Juel Burrow office.  Patient reports that they were performed not too long ago.  No role for labs today.  He provided discs with his imaging from [facility].  We will check to see if these imaging discs can be imported for comparison in the future.  CT of chest with contrast in ~ 2 weeks.  His last CT scan in Feb 2017 in [facility] recommended a short interval following test. Imaging studies will need to be compared to prior studies from [place].  I will see him back post to review and for additional recommendations.   Return in 3 weeks for follow-up.  I addressed the importance of smoking cessation with the patient in detail.  We discussed the health benefits of cessation.  We discussed the health detriments of  ongoing tobacco use including but not limited to COPD, heart disease and recurrent malignancy. We reviewed the multiple options for cessation and I offered to refer him to smoking cessation classes. We discussed other alternatives to quit such as chantix, wellbutrin. We will continue to address this moving forward.  ORDERS PLACED FOR THIS ENCOUNTER: Orders Placed This Encounter  Procedures  . CT Chest W Contrast    MEDICATIONS UPDATED ON THIS ENCOUNTER: Meds ordered this encounter  Medications  . latanoprost (XALATAN) 0.005 % ophthalmic solution    Sig: Place 1 drop into both eyes at bedtime.    All questions were answered. The patient knows to call the clinic with any problems, questions or concerns.  We can certainly see the patient much sooner if necessary.  Patient and plan discussed with Dr. Ancil Linsey and she is in agreement with the aforementioned.   This note is electronically signed by: Molli Hazard, MD  08/10/2015 3:01 PM

## 2015-08-10 NOTE — Assessment & Plan Note (Addendum)
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  He underwent XRT from 02/25/2012- 04/29/2012.  Oncology history developed.  Staging in [facility] problem list is completed to the most accurate of information I have available.  Our encounter started off poorly.  I was ~ 35 minutes late and upon my entrance into the exam room, Eric Hinton checked his watch and then proceeded to lecture me about punctuality.  He admits that he is very impatient and has no tolerance for tardiness.  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  I sternly proceeded to educate him that unfortunately patients with cancer sometime have difficulties during treatment, much like he experienced.  When he had issues during treatment, I hope his treating providers took time out of their schedule to provide him appropriate care that I am sure he is grateful of.  That same patient care continues and is provided to other patients and therefore, sometimes, we all get behind schedule.  Even though this happens, it is not planned, nor is it a goal of mine because when I am late seeing patients, that means my work day is extended.  Like him, I have family and other hobbies outside of work that I like to participate in.  Therefore, he is advised that I do not appreciate his attitude, nor his disrespect.  He is offered a transfer of medical oncology care at this time.  "Can we start over?" he replies.  The remaining time during our visit today was much improved and pleasant.  He has a history of sequential colonoscopies in [place] for polypectomies.  I see documentation in his  paper chart that his last colonoscopy was 11/2013.  He will need establishment with GI locally for continued GI care and colonoscopies when indicated.  We will try to get labs from Dr. Juel Burrow office.  Patient reports that they were performed not too long ago.  No role for labs today.  He provided discs with his imaging from [facility].  We will check to see if these imaging discs can be imported for comparison in the future.  CT of chest with contrast in ~ 2 weeks.  His last CT scan in Feb 2017 in [facility] recommended a short interval following test.  Return in 3 weeks for follow-up.

## 2015-08-15 ENCOUNTER — Encounter (HOSPITAL_COMMUNITY): Payer: Self-pay | Admitting: Oncology

## 2015-08-16 ENCOUNTER — Ambulatory Visit (HOSPITAL_COMMUNITY): Payer: Medicare Other

## 2015-08-16 DIAGNOSIS — H4311 Vitreous hemorrhage, right eye: Secondary | ICD-10-CM | POA: Diagnosis not present

## 2015-08-17 ENCOUNTER — Ambulatory Visit (HOSPITAL_COMMUNITY): Payer: Medicare Other

## 2015-08-17 ENCOUNTER — Ambulatory Visit (HOSPITAL_COMMUNITY)
Admission: RE | Admit: 2015-08-17 | Discharge: 2015-08-17 | Disposition: A | Discharge status: Home / Self Care | Payer: Medicare Other | Source: Ambulatory Visit | Attending: Oncology | Admitting: Oncology

## 2015-08-17 ENCOUNTER — Encounter: Payer: Self-pay | Admitting: Vascular Surgery

## 2015-08-17 ENCOUNTER — Ambulatory Visit (HOSPITAL_COMMUNITY)
Admission: RE | Admit: 2015-08-17 | Discharge: 2015-08-17 | Disposition: A | Discharge status: Home / Self Care | Payer: Medicare Other | Source: Ambulatory Visit | Attending: Vascular Surgery | Admitting: Vascular Surgery

## 2015-08-17 DIAGNOSIS — J479 Bronchiectasis, uncomplicated: Secondary | ICD-10-CM | POA: Insufficient documentation

## 2015-08-17 DIAGNOSIS — C3491 Malignant neoplasm of unspecified part of right bronchus or lung: Secondary | ICD-10-CM | POA: Insufficient documentation

## 2015-08-17 DIAGNOSIS — I6523 Occlusion and stenosis of bilateral carotid arteries: Secondary | ICD-10-CM | POA: Insufficient documentation

## 2015-08-17 DIAGNOSIS — I2510 Atherosclerotic heart disease of native coronary artery without angina pectoris: Secondary | ICD-10-CM | POA: Diagnosis not present

## 2015-08-17 DIAGNOSIS — R918 Other nonspecific abnormal finding of lung field: Secondary | ICD-10-CM | POA: Insufficient documentation

## 2015-08-17 MED ORDER — IOPAMIDOL (ISOVUE-370) INJECTION 76%: 75.0000 mL | Freq: Once | INTRAVENOUS | Status: DC | PRN

## 2015-08-17 MED ORDER — IOPAMIDOL (ISOVUE-370) INJECTION 76%
100.0000 mL | Freq: Once | INTRAVENOUS | Status: AC | PRN
  Administered 2015-08-17: 100 mL via INTRAVENOUS

## 2015-08-19 ENCOUNTER — Ambulatory Visit (HOSPITAL_COMMUNITY): Payer: Medicare Other | Admitting: Hematology & Oncology

## 2015-08-19 NOTE — Progress Notes (Signed)
This encounter was created in error - please disregard.

## 2015-08-23 ENCOUNTER — Encounter: Payer: Self-pay | Admitting: Vascular Surgery

## 2015-08-23 ENCOUNTER — Ambulatory Visit (INDEPENDENT_AMBULATORY_CARE_PROVIDER_SITE_OTHER): Payer: Medicare Other | Admitting: Vascular Surgery

## 2015-08-23 VITALS — BP 130/79 | HR 84 | Ht 65.5 in | Wt 196.9 lb

## 2015-08-23 DIAGNOSIS — I6523 Occlusion and stenosis of bilateral carotid arteries: Secondary | ICD-10-CM

## 2015-08-23 NOTE — Progress Notes (Addendum)
Vascular and Vein Specialist of [facility]  Patient name: Eric Hinton MRN: [id number] DOB: 10/31/1940 Sex: male  REASON FOR VISIT: Continued discussion of carotid disease.  HPI: Eric Hinton is a 75 y.o. male here today for discussion of recent CT angiogram of his neck for further evaluation of carotid disease. Since my last visit with him several weeks ago he has had another event of right eye visual changes. Reports that he was mowing his yard on a riding mower and had approximate 4 hours of very blurred vision which is spontaneously resolved. Denies any right or left side focal symptoms other than vision changes.  Past Medical History  Diagnosis Date  . H/O Squamous cell lung cancer ([place]) 07/15/2015  . Hypertension   . Hyperlipidemia     Family History  Problem Relation Age of Onset  . Cancer Mother     cancer  . Stroke Father   . Heart disease Father     SOCIAL HISTORY: Social History  Substance Use Topics  . Smoking status: Current Every Day Smoker    Types: Cigarettes  . Smokeless tobacco: Never Used  . Alcohol Use: 0.0 oz/week    0 Standard drinks or equivalent per week    Allergies  Allergen Reactions  . Wellbutrin [Bupropion] Hives    Current Outpatient Prescriptions  Medication Sig Dispense Refill  . aspirin 325 MG tablet Take 325 mg by mouth daily.    Marland Kitchen atorvastatin (LIPITOR) 10 MG tablet Take 10 mg by mouth daily.    . Cyanocobalamin (VITAMIN B-12) 2500 MCG SUBL Place 2,500 mcg under the tongue daily.    Marland Kitchen latanoprost (XALATAN) 0.005 % ophthalmic solution Place 1 drop into both eyes at bedtime.    . valsartan (DIOVAN) 320 MG tablet Take 320 mg by mouth daily.     No current facility-administered medications for this visit.    REVIEW OF SYSTEMS:  '[X]'$  denotes positive finding, '[ ]'$  denotes negative finding Cardiac  Comments:  Chest pain or chest pressure:    Shortness of breath upon exertion:    Short of breath when lying flat:    Irregular  heart rhythm:        Vascular    Pain in calf, thigh, or hip brought on by ambulation:    Pain in feet at night that wakes you up from your sleep:     Blood clot in your veins:    Leg swelling:         Pulmonary    Oxygen at home:    Productive cough:     Wheezing:         Neurologic    Sudden weakness in arms or legs:     Sudden numbness in arms or legs:     Sudden onset of difficulty speaking or slurred speech:    Temporary loss of vision in one eye:  x   Problems with dizziness:         Gastrointestinal    Blood in stool:     Vomited blood:         Genitourinary    Burning when urinating:     Blood in urine:        Psychiatric    Major depression:         Hematologic    Bleeding problems:    Problems with blood clotting too easily:        Skin    Rashes or ulcers:  Constitutional    Fever or chills:      PHYSICAL EXAM: Filed Vitals:   08/23/15 1141 08/23/15 1144  BP: 140/83 130/79  Pulse: 84   Height: 5' 5.5" (1.664 m)   Weight: 196 lb 14.4 oz (89.313 kg)   SpO2: 95%     GENERAL: The patient is a well-nourished male, in no acute distress. The vital signs are documented above. PULMONARY: There is good air exchange   MUSCULOSKELETAL: There are no major deformities or cyanosis. NEUROLOGIC: No focal weakness or paresthesias are detected. SKIN: There are no ulcers or rashes noted. PSYCHIATRIC: The patient has a normal affect.  DATA:  CT scan was reviewed and discussed with the patient. I reviewed the actual films with the patient and his wife present as well. This does show greater than 90% left internal carotid artery stenosis. This is asymptomatic. On the right he has at least 50-60% stenosis in his internal carotid artery. There is irregular soft plaque that is protruding into the origin of the internal carotid artery.  MEDICAL ISSUES: Discusses finding at length with the patient. Concerning regarding his ongoing transient visual changes in the  right eye. Explained would recommend eventual left endarterectomy for his greater than 90% stenosis. And more concern regarding his right stenosis. Although this is in the 50-60% range, he continues to have right eye visual changes. I have a phone call into his ophthalmologist Dr. Iona Hansen. If no other source for his transient visual loss can be found I would recommend right endarterectomy. Following recovery I would recommend left endarterectomy for his high-grade critical left carotid stenosis. We will contact him following my discussion with ophthalmology    Clarivel Callaway [facility] of [facility] [phone]    I spoke by telephone with Dr.Haines. He reports that he did not see specific Hollenhorst plaques in the eye. He didn't see evidence of retinal ischemia which may be coming from embolic disease. Did not know of any other likely cause for his recurrent transient blindness episodes. Will proceed with right carotid endarterectomy at the patient's earliest convenience.

## 2015-08-24 ENCOUNTER — Other Ambulatory Visit: Payer: Self-pay

## 2015-08-24 NOTE — Pre-Procedure Instructions (Addendum)
Lc P Valencia  08/24/2015      [id number] - , [place] - [address] AT [place] [address] Phone: [phone] Fax: [phone]  [facility] [place], [place] [address] [place] [address] Phone: [phone] Fax: [phone]    Your procedure is scheduled on Monday June 5.  Report to [facility] Admitting at 0730 A.M.  Call this number if you have problems the morning of surgery:  [phone]   Remember:  Do not eat food or drink liquids after midnight.  Take these medicines the morning of surgery with A SIP OF WATER none   7 days prior to surgery STOP taking any Aspirin, Aleve, Naproxen, Ibuprofen, Motrin, Advil, Goody's, BC's, all herbal medications, fish oil, and all vitamins    Do not wear jewelry, make-up or nail polish.  Do not wear lotions, powders, or perfumes.  You may wear deodorant.  Do not shave 48 hours prior to surgery.  Men may shave face and neck.  Do not bring valuables to the hospital.  [facility] is not responsible for any belongings or valuables.  Contacts, dentures or bridgework may not be worn into surgery.  Leave your suitcase in the car.  After surgery it may be brought to your room.  For patients admitted to the hospital, discharge time will be determined by your treatment team.  Patients discharged the day of surgery will not be allowed to drive home.    Special instructions:   [facility]- Preparing For Surgery  Before surgery, you can play an important role. Because skin is not sterile, your skin needs to be as free of germs as possible. You can reduce the number of germs on your skin by washing with CHG (chlorahexidine gluconate) Soap before surgery.  CHG is an antiseptic cleaner which kills germs and bonds with the skin to continue killing germs even after washing.  Please do not use if  you have an allergy to CHG or antibacterial soaps. If your skin becomes reddened/irritated stop using the CHG.  Do not shave (including legs and underarms) for at least 48 hours prior to first CHG shower. It is OK to shave your face.  Please follow these instructions carefully.   1. Shower the NIGHT BEFORE SURGERY and the MORNING OF SURGERY with CHG.   2. If you chose to wash your hair, wash your hair first as usual with your normal shampoo.  3. After you shampoo, rinse your hair and body thoroughly to remove the shampoo.  4. Use CHG as you would any other liquid soap. You can apply CHG directly to the skin and wash gently with a scrungie or a clean washcloth.   5. Apply the CHG Soap to your body ONLY FROM THE NECK DOWN.  Do not use on open wounds or open sores. Avoid contact with your eyes, ears, mouth and genitals (private parts). Wash genitals (private parts) with your normal soap.  6. Wash thoroughly, paying special attention to the area where your surgery will be performed.  7. Thoroughly rinse your body with warm water from the neck down.  8. DO NOT shower/wash with your normal soap after using and rinsing off the CHG Soap.  9. Pat yourself dry with a CLEAN TOWEL.   10. Wear CLEAN PAJAMAS   11. Place CLEAN SHEETS on your  bed the night of your first shower and DO NOT SLEEP WITH PETS.    Day of Surgery: Do not apply any deodorants/lotions. Please wear clean clothes to the hospital/surgery center.      Please read over the following fact sheets that you were given. Blood Transfusion Information and MRSA Information

## 2015-08-25 ENCOUNTER — Encounter (HOSPITAL_COMMUNITY)
Admission: RE | Admit: 2015-08-25 | Discharge: 2015-08-25 | Disposition: A | Discharge status: Home / Self Care | Payer: Medicare Other | Source: Ambulatory Visit | Attending: Vascular Surgery | Admitting: Vascular Surgery

## 2015-08-25 ENCOUNTER — Encounter (HOSPITAL_COMMUNITY): Payer: Self-pay

## 2015-08-25 DIAGNOSIS — Z01812 Encounter for preprocedural laboratory examination: Secondary | ICD-10-CM | POA: Insufficient documentation

## 2015-08-25 DIAGNOSIS — I6521 Occlusion and stenosis of right carotid artery: Secondary | ICD-10-CM | POA: Diagnosis not present

## 2015-08-25 DIAGNOSIS — Z0183 Encounter for blood typing: Secondary | ICD-10-CM | POA: Diagnosis not present

## 2015-08-25 HISTORY — DX: Unspecified osteoarthritis, unspecified site: M19.90

## 2015-08-25 HISTORY — DX: Chronic obstructive pulmonary disease, unspecified: J44.9

## 2015-08-25 LAB — COMPREHENSIVE METABOLIC PANEL
ALT: 19 U/L (ref 17–63)
AST: 17 U/L (ref 15–41)
Albumin: 4 g/dL (ref 3.5–5.0)
Alkaline Phosphatase: 57 U/L (ref 38–126)
Anion gap: 7 (ref 5–15)
BUN: 12 mg/dL (ref 6–20)
CHLORIDE: 106 mmol/L (ref 101–111)
CO2: 26 mmol/L (ref 22–32)
CREATININE: 1.26 mg/dL — AB (ref 0.61–1.24)
Calcium: 9.8 mg/dL (ref 8.9–10.3)
GFR calc non Af Amer: 54 mL/min — ABNORMAL LOW (ref >60)
Glucose, Bld: 105 mg/dL — ABNORMAL HIGH (ref 65–99)
POTASSIUM: 3.6 mmol/L (ref 3.5–5.1)
SODIUM: 139 mmol/L (ref 135–145)
Total Bilirubin: 0.9 mg/dL (ref 0.3–1.2)
Total Protein: 7.1 g/dL (ref 6.5–8.1)

## 2015-08-25 LAB — PROTIME-INR
INR: 1.12 (ref 0.00–1.49)
Prothrombin Time: 14.5 seconds (ref 11.6–15.2)

## 2015-08-25 LAB — URINALYSIS, ROUTINE W REFLEX MICROSCOPIC
BILIRUBIN URINE: NEGATIVE
GLUCOSE, UA: NEGATIVE mg/dL
HGB URINE DIPSTICK: NEGATIVE
KETONES UR: NEGATIVE mg/dL
LEUKOCYTES UA: NEGATIVE
Nitrite: NEGATIVE
PROTEIN: NEGATIVE mg/dL
Specific Gravity, Urine: 1.014 (ref 1.005–1.030)
pH: 6 (ref 5.0–8.0)

## 2015-08-25 LAB — TYPE AND SCREEN
ABO/RH(D): A POS
ANTIBODY SCREEN: NEGATIVE

## 2015-08-25 LAB — CBC
HCT: 42.1 % (ref 39.0–52.0)
Hemoglobin: 14.4 g/dL (ref 13.0–17.0)
MCH: 31.7 pg (ref 26.0–34.0)
MCHC: 34.2 g/dL (ref 30.0–36.0)
MCV: 92.7 fL (ref 78.0–100.0)
PLATELETS: 206 10*3/uL (ref 150–400)
RBC: 4.54 MIL/uL (ref 4.22–5.81)
RDW: 12.4 % (ref 11.5–15.5)
WBC: 5.9 10*3/uL (ref 4.0–10.5)

## 2015-08-25 LAB — SURGICAL PCR SCREEN
MRSA, PCR: NEGATIVE
Staphylococcus aureus: NEGATIVE

## 2015-08-25 LAB — APTT: aPTT: 33 seconds (ref 24–37)

## 2015-08-25 LAB — ABO/RH: ABO/RH(D): A POS

## 2015-08-25 NOTE — Progress Notes (Signed)
PCP - Allyn Kenner Cardiologist - denies  Chest x-ray -  EKG - 05/11/15 Stress Test - >30 years ECHO - unsure Cardiac Cath - denies  Patient moved from [place], requested information from [facility] in [place], [place]  Patient stated that they were instructed to stop aspirin the morning of surgery    Patient denies shortness of breath and chest pain at PAT appointment

## 2015-08-26 NOTE — Progress Notes (Signed)
[facility] to check on status of records requested. States they never received request d/t wrong fax number. Received correct fax number and refaxed.

## 2015-08-28 MED ORDER — DEXTROSE 5 % IV SOLN
1.5000 g | INTRAVENOUS | Status: AC
  Administered 2015-08-29: 1.5 g via INTRAVENOUS
  Filled 2015-08-28: qty 1.5

## 2015-08-29 ENCOUNTER — Encounter (HOSPITAL_COMMUNITY)
Admission: RE | Disposition: A | Discharge status: Home / Self Care | Payer: Self-pay | Source: Ambulatory Visit | Attending: Vascular Surgery

## 2015-08-29 ENCOUNTER — Encounter (HOSPITAL_COMMUNITY): Payer: Self-pay | Admitting: General Practice

## 2015-08-29 ENCOUNTER — Inpatient Hospital Stay (HOSPITAL_COMMUNITY)
Admission: RE | Admit: 2015-08-29 | Discharge: 2015-08-30 | DRG: 039 | Disposition: A | Discharge status: Home / Self Care | Payer: Medicare Other | Source: Ambulatory Visit | Attending: Vascular Surgery | Admitting: Vascular Surgery

## 2015-08-29 ENCOUNTER — Inpatient Hospital Stay (HOSPITAL_COMMUNITY): Payer: Medicare Other | Admitting: Certified Registered Nurse Anesthetist

## 2015-08-29 DIAGNOSIS — Z85118 Personal history of other malignant neoplasm of bronchus and lung: Secondary | ICD-10-CM

## 2015-08-29 DIAGNOSIS — F17210 Nicotine dependence, cigarettes, uncomplicated: Secondary | ICD-10-CM | POA: Diagnosis present

## 2015-08-29 DIAGNOSIS — I959 Hypotension, unspecified: Secondary | ICD-10-CM | POA: Diagnosis present

## 2015-08-29 DIAGNOSIS — I10 Essential (primary) hypertension: Secondary | ICD-10-CM | POA: Diagnosis present

## 2015-08-29 DIAGNOSIS — Z7982 Long term (current) use of aspirin: Secondary | ICD-10-CM | POA: Diagnosis not present

## 2015-08-29 DIAGNOSIS — I6529 Occlusion and stenosis of unspecified carotid artery: Secondary | ICD-10-CM | POA: Diagnosis present

## 2015-08-29 DIAGNOSIS — F17200 Nicotine dependence, unspecified, uncomplicated: Secondary | ICD-10-CM | POA: Diagnosis present

## 2015-08-29 DIAGNOSIS — E785 Hyperlipidemia, unspecified: Secondary | ICD-10-CM | POA: Diagnosis present

## 2015-08-29 DIAGNOSIS — Z79899 Other long term (current) drug therapy: Secondary | ICD-10-CM

## 2015-08-29 DIAGNOSIS — J449 Chronic obstructive pulmonary disease, unspecified: Secondary | ICD-10-CM | POA: Diagnosis not present

## 2015-08-29 DIAGNOSIS — I6521 Occlusion and stenosis of right carotid artery: Principal | ICD-10-CM | POA: Diagnosis present

## 2015-08-29 DIAGNOSIS — H538 Other visual disturbances: Secondary | ICD-10-CM | POA: Diagnosis not present

## 2015-08-29 DIAGNOSIS — M1990 Unspecified osteoarthritis, unspecified site: Secondary | ICD-10-CM | POA: Diagnosis not present

## 2015-08-29 HISTORY — PX: ENDARTERECTOMY: SHX5162

## 2015-08-29 SURGERY — ENDARTERECTOMY, CAROTID
Anesthesia: General | Site: Neck | Laterality: Right

## 2015-08-29 MED ORDER — PHENYLEPHRINE HCL 10 MG/ML IJ SOLN
INTRAMUSCULAR | Status: DC | PRN
  Administered 2015-08-29 (×5): 40 ug via INTRAVENOUS

## 2015-08-29 MED ORDER — MAGNESIUM SULFATE 2 GM/50ML IV SOLN
2.0000 g | Freq: Every day | INTRAVENOUS | Status: DC | PRN
Start: 2015-08-29 — End: 2015-08-30

## 2015-08-29 MED ORDER — DOCUSATE SODIUM 100 MG PO CAPS
100.0000 mg | ORAL_CAPSULE | Freq: Every day | ORAL | Status: DC
  Administered 2015-08-30: 100 mg via ORAL
  Filled 2015-08-29: qty 1

## 2015-08-29 MED ORDER — PROPOFOL 10 MG/ML IV BOLUS
INTRAVENOUS | Status: AC
  Filled 2015-08-29: qty 20

## 2015-08-29 MED ORDER — ACETAMINOPHEN 325 MG RE SUPP: 325.0000 mg | RECTAL | Status: DC | PRN

## 2015-08-29 MED ORDER — CHLORHEXIDINE GLUCONATE CLOTH 2 % EX PADS: 6.0000 | MEDICATED_PAD | Freq: Once | CUTANEOUS | Status: DC

## 2015-08-29 MED ORDER — DOPAMINE-DEXTROSE 3.2-5 MG/ML-% IV SOLN
0.0000 ug/kg/min | INTRAVENOUS | Status: DC
  Administered 2015-08-29: 5 ug/kg/min via INTRAVENOUS
  Administered 2015-08-29: 3 ug/kg/min via INTRAVENOUS
  Administered 2015-08-29: 5 ug/kg/min via INTRAVENOUS
  Filled 2015-08-29: qty 250

## 2015-08-29 MED ORDER — HEPARIN SODIUM (PORCINE) 1000 UNIT/ML IJ SOLN
INTRAMUSCULAR | Status: DC | PRN
  Administered 2015-08-29: 8000 [IU] via INTRAVENOUS

## 2015-08-29 MED ORDER — DEXTROSE 5 % IV SOLN
1.5000 g | Freq: Two times a day (BID) | INTRAVENOUS | Status: AC
  Administered 2015-08-29 – 2015-08-30 (×2): 1.5 g via INTRAVENOUS
  Filled 2015-08-29 (×3): qty 1.5

## 2015-08-29 MED ORDER — FENTANYL CITRATE (PF) 250 MCG/5ML IJ SOLN
INTRAMUSCULAR | Status: AC
  Filled 2015-08-29: qty 5

## 2015-08-29 MED ORDER — ENOXAPARIN SODIUM 40 MG/0.4ML ~~LOC~~ SOLN: 40.0000 mg | SUBCUTANEOUS | Status: DC

## 2015-08-29 MED ORDER — PROTAMINE SULFATE 10 MG/ML IV SOLN
INTRAVENOUS | Status: DC | PRN
  Administered 2015-08-29: 20 mg via INTRAVENOUS
  Administered 2015-08-29 (×3): 10 mg via INTRAVENOUS

## 2015-08-29 MED ORDER — SODIUM CHLORIDE 0.9 % IV SOLN
500.0000 mL | Freq: Once | INTRAVENOUS | Status: AC | PRN
  Administered 2015-08-29: 500 mL via INTRAVENOUS

## 2015-08-29 MED ORDER — GUAIFENESIN-DM 100-10 MG/5ML PO SYRP: 15.0000 mL | ORAL_SOLUTION | ORAL | Status: DC | PRN

## 2015-08-29 MED ORDER — PROPOFOL 10 MG/ML IV BOLUS
INTRAVENOUS | Status: DC | PRN
  Administered 2015-08-29: 50 mg via INTRAVENOUS
  Administered 2015-08-29: 30 mg via INTRAVENOUS
  Administered 2015-08-29: 50 mg via INTRAVENOUS

## 2015-08-29 MED ORDER — MORPHINE SULFATE (PF) 2 MG/ML IV SOLN: 2.0000 mg | INTRAVENOUS | Status: DC | PRN

## 2015-08-29 MED ORDER — LACTATED RINGERS IV SOLN
INTRAVENOUS | Status: DC
  Administered 2015-08-29 (×3): via INTRAVENOUS

## 2015-08-29 MED ORDER — ONDANSETRON HCL 4 MG/2ML IJ SOLN
4.0000 mg | Freq: Four times a day (QID) | INTRAMUSCULAR | Status: DC | PRN
Start: 2015-08-29 — End: 2015-08-30

## 2015-08-29 MED ORDER — HYDRALAZINE HCL 20 MG/ML IJ SOLN: 5.0000 mg | INTRAMUSCULAR | Status: DC | PRN

## 2015-08-29 MED ORDER — LABETALOL HCL 5 MG/ML IV SOLN: 10.0000 mg | INTRAVENOUS | Status: DC | PRN

## 2015-08-29 MED ORDER — POTASSIUM CHLORIDE CRYS ER 20 MEQ PO TBCR: 20.0000 meq | EXTENDED_RELEASE_TABLET | Freq: Every day | ORAL | Status: DC | PRN

## 2015-08-29 MED ORDER — LATANOPROST 0.005 % OP SOLN
1.0000 [drp] | Freq: Every day | OPHTHALMIC | Status: DC
  Administered 2015-08-29: 1 [drp] via OPHTHALMIC
  Filled 2015-08-29: qty 2.5

## 2015-08-29 MED ORDER — VITAMIN B-12 2500 MCG SL SUBL: 2500.0000 ug | SUBLINGUAL_TABLET | Freq: Every day | SUBLINGUAL | Status: DC

## 2015-08-29 MED ORDER — ATORVASTATIN CALCIUM 10 MG PO TABS
10.0000 mg | ORAL_TABLET | Freq: Every day | ORAL | Status: DC
  Administered 2015-08-29 – 2015-08-30 (×2): 10 mg via ORAL
  Filled 2015-08-29 (×2): qty 1

## 2015-08-29 MED ORDER — IRBESARTAN 300 MG PO TABS: 300.0000 mg | ORAL_TABLET | Freq: Every day | ORAL | Status: DC

## 2015-08-29 MED ORDER — ROCURONIUM BROMIDE 100 MG/10ML IV SOLN
INTRAVENOUS | Status: DC | PRN
  Administered 2015-08-29: 50 mg via INTRAVENOUS

## 2015-08-29 MED ORDER — OXYCODONE HCL 5 MG/5ML PO SOLN: 5.0000 mg | Freq: Once | ORAL | Status: DC | PRN

## 2015-08-29 MED ORDER — ACETAMINOPHEN 325 MG PO TABS: 325.0000 mg | ORAL_TABLET | ORAL | Status: DC | PRN

## 2015-08-29 MED ORDER — ONDANSETRON HCL 4 MG/2ML IJ SOLN
INTRAMUSCULAR | Status: DC | PRN
  Administered 2015-08-29: 4 mg via INTRAVENOUS

## 2015-08-29 MED ORDER — SODIUM CHLORIDE 0.9 % IV SOLN
INTRAVENOUS | Status: DC | PRN
  Administered 2015-08-29: 500 mL

## 2015-08-29 MED ORDER — HEPARIN SODIUM (PORCINE) 1000 UNIT/ML IJ SOLN
INTRAMUSCULAR | Status: AC
  Filled 2015-08-29: qty 1

## 2015-08-29 MED ORDER — METOPROLOL TARTRATE 5 MG/5ML IV SOLN: 2.0000 mg | INTRAVENOUS | Status: DC | PRN

## 2015-08-29 MED ORDER — SUGAMMADEX SODIUM 200 MG/2ML IV SOLN
INTRAVENOUS | Status: DC | PRN
  Administered 2015-08-29: 200 mg via INTRAVENOUS

## 2015-08-29 MED ORDER — PANTOPRAZOLE SODIUM 40 MG PO TBEC
40.0000 mg | DELAYED_RELEASE_TABLET | Freq: Every day | ORAL | Status: DC
  Administered 2015-08-29 – 2015-08-30 (×2): 40 mg via ORAL
  Filled 2015-08-29 (×2): qty 1

## 2015-08-29 MED ORDER — SODIUM CHLORIDE 0.9 % IV SOLN
INTRAVENOUS | Status: DC
  Administered 2015-08-29: 19:00:00 via INTRAVENOUS

## 2015-08-29 MED ORDER — 0.9 % SODIUM CHLORIDE (POUR BTL) OPTIME
TOPICAL | Status: DC | PRN
Start: 2015-08-29 — End: 2015-08-29
  Administered 2015-08-29: 1000 mL

## 2015-08-29 MED ORDER — OXYCODONE HCL 5 MG PO TABS: 5.0000 mg | ORAL_TABLET | Freq: Once | ORAL | Status: DC | PRN

## 2015-08-29 MED ORDER — DOPAMINE-DEXTROSE 3.2-5 MG/ML-% IV SOLN
INTRAVENOUS | Status: AC
  Administered 2015-08-29: 800 mg
  Filled 2015-08-29: qty 250

## 2015-08-29 MED ORDER — VITAMIN B-12 1000 MCG PO TABS
2500.0000 ug | ORAL_TABLET | Freq: Every day | ORAL | Status: DC
  Administered 2015-08-30: 2500 ug via ORAL
  Filled 2015-08-29: qty 3

## 2015-08-29 MED ORDER — GLYCOPYRROLATE 0.2 MG/ML IJ SOLN
INTRAMUSCULAR | Status: DC | PRN
  Administered 2015-08-29 (×3): 0.2 mg via INTRAVENOUS

## 2015-08-29 MED ORDER — ASPIRIN 325 MG PO TABS
325.0000 mg | ORAL_TABLET | Freq: Every day | ORAL | Status: DC
  Administered 2015-08-29 – 2015-08-30 (×2): 325 mg via ORAL
  Filled 2015-08-29 (×2): qty 1

## 2015-08-29 MED ORDER — PHENOL 1.4 % MT LIQD: 1.0000 | OROMUCOSAL | Status: DC | PRN

## 2015-08-29 MED ORDER — ALUM & MAG HYDROXIDE-SIMETH 200-200-20 MG/5ML PO SUSP: 15.0000 mL | ORAL | Status: DC | PRN

## 2015-08-29 MED ORDER — PHENYLEPHRINE HCL 10 MG/ML IJ SOLN
20.0000 mg | INTRAVENOUS | Status: DC | PRN
  Administered 2015-08-29: 10 ug/min via INTRAVENOUS

## 2015-08-29 MED ORDER — ACETAMINOPHEN 160 MG/5ML PO SOLN: 325.0000 mg | ORAL | Status: DC | PRN

## 2015-08-29 MED ORDER — SODIUM CHLORIDE 0.9 % IV SOLN: INTRAVENOUS | Status: DC

## 2015-08-29 MED ORDER — SODIUM CHLORIDE 0.9 % IV SOLN
0.0125 ug/kg/min | INTRAVENOUS | Status: AC
  Administered 2015-08-29: .3 ug/kg/min via INTRAVENOUS
  Administered 2015-08-29: 20 ug via INTRAVENOUS
  Filled 2015-08-29: qty 2000

## 2015-08-29 MED ORDER — LIDOCAINE HCL (CARDIAC) 20 MG/ML IV SOLN
INTRAVENOUS | Status: DC | PRN
  Administered 2015-08-29: 40 mg via INTRAVENOUS

## 2015-08-29 MED ORDER — OXYCODONE-ACETAMINOPHEN 5-325 MG PO TABS: 1.0000 | ORAL_TABLET | Freq: Four times a day (QID) | ORAL | Status: DC | PRN

## 2015-08-29 MED ORDER — LIDOCAINE HCL (PF) 1 % IJ SOLN
INTRAMUSCULAR | Status: AC
  Filled 2015-08-29: qty 30

## 2015-08-29 MED ORDER — FENTANYL CITRATE (PF) 100 MCG/2ML IJ SOLN
INTRAMUSCULAR | Status: AC
  Filled 2015-08-29: qty 2

## 2015-08-29 MED ORDER — OXYCODONE-ACETAMINOPHEN 5-325 MG PO TABS
1.0000 | ORAL_TABLET | ORAL | Status: DC | PRN
Start: 2015-08-29 — End: 2015-08-30

## 2015-08-29 MED ORDER — FENTANYL CITRATE (PF) 100 MCG/2ML IJ SOLN
25.0000 ug | INTRAMUSCULAR | Status: DC | PRN
  Administered 2015-08-29: 50 ug via INTRAVENOUS

## 2015-08-29 MED ORDER — LIDOCAINE 2% (20 MG/ML) 5 ML SYRINGE
INTRAMUSCULAR | Status: AC
  Filled 2015-08-29: qty 10

## 2015-08-29 SURGICAL SUPPLY — 48 items
BENZOIN TINCTURE PRP APPL 2/3 (GAUZE/BANDAGES/DRESSINGS) ×3 IMPLANT
CANISTER SUCTION 2500CC (MISCELLANEOUS) ×3 IMPLANT
CANNULA VESSEL 3MM 2 BLNT TIP (CANNULA) ×6 IMPLANT
CATH ROBINSON RED A/P 18FR (CATHETERS) ×3 IMPLANT
CLIP LIGATING EXTRA MED SLVR (CLIP) ×3 IMPLANT
CLIP LIGATING EXTRA SM BLUE (MISCELLANEOUS) ×3 IMPLANT
CLOSURE STERI-STRIP 1/2X4 (GAUZE/BANDAGES/DRESSINGS) ×1
CLOSURE WOUND 1/2 X4 (GAUZE/BANDAGES/DRESSINGS) ×1
CLSR STERI-STRIP ANTIMIC 1/2X4 (GAUZE/BANDAGES/DRESSINGS) ×2 IMPLANT
CRADLE DONUT ADULT HEAD (MISCELLANEOUS) ×3 IMPLANT
DECANTER SPIKE VIAL GLASS SM (MISCELLANEOUS) IMPLANT
DRAIN HEMOVAC 1/8 X 5 (WOUND CARE) IMPLANT
DRSG COVADERM 4X8 (GAUZE/BANDAGES/DRESSINGS) ×3 IMPLANT
ELECT REM PT RETURN 9FT ADLT (ELECTROSURGICAL) ×3
ELECTRODE REM PT RTRN 9FT ADLT (ELECTROSURGICAL) ×1 IMPLANT
EVACUATOR SILICONE 100CC (DRAIN) IMPLANT
GAUZE SPONGE 4X4 12PLY STRL (GAUZE/BANDAGES/DRESSINGS) IMPLANT
GEL ULTRASOUND 20GR AQUASONIC (MISCELLANEOUS) IMPLANT
GLOVE BIO SURGEON STRL SZ7.5 (GLOVE) ×3 IMPLANT
GLOVE BIOGEL PI IND STRL 6.5 (GLOVE) ×4 IMPLANT
GLOVE BIOGEL PI IND STRL 8 (GLOVE) ×1 IMPLANT
GLOVE BIOGEL PI INDICATOR 6.5 (GLOVE) ×8
GLOVE BIOGEL PI INDICATOR 8 (GLOVE) ×2
GLOVE SS BIOGEL STRL SZ 7.5 (GLOVE) ×1 IMPLANT
GLOVE SUPERSENSE BIOGEL SZ 7.5 (GLOVE) ×2
GOWN STRL REUS W/ TWL LRG LVL3 (GOWN DISPOSABLE) ×3 IMPLANT
GOWN STRL REUS W/TWL LRG LVL3 (GOWN DISPOSABLE) ×6
GOWN STRL REUS W/TWL XL LVL3 (GOWN DISPOSABLE) ×3 IMPLANT
KIT BASIN OR (CUSTOM PROCEDURE TRAY) ×3 IMPLANT
KIT ROOM TURNOVER OR (KITS) ×3 IMPLANT
NEEDLE 22X1 1/2 (OR ONLY) (NEEDLE) IMPLANT
NS IRRIG 1000ML POUR BTL (IV SOLUTION) ×6 IMPLANT
PACK CAROTID (CUSTOM PROCEDURE TRAY) ×3 IMPLANT
PAD ARMBOARD 7.5X6 YLW CONV (MISCELLANEOUS) ×6 IMPLANT
PATCH HEMASHIELD 8X75 (Vascular Products) ×3 IMPLANT
SHUNT CAROTID BYPASS 10 (VASCULAR PRODUCTS) ×3 IMPLANT
SHUNT CAROTID BYPASS 12FRX15.5 (VASCULAR PRODUCTS) IMPLANT
SPONGE INTESTINAL PEANUT (DISPOSABLE) IMPLANT
STRIP CLOSURE SKIN 1/2X4 (GAUZE/BANDAGES/DRESSINGS) ×2 IMPLANT
SUT ETHILON 3 0 PS 1 (SUTURE) IMPLANT
SUT PROLENE 6 0 CC (SUTURE) ×6 IMPLANT
SUT SILK 3 0 (SUTURE)
SUT SILK 3-0 18XBRD TIE 12 (SUTURE) IMPLANT
SUT VIC AB 3-0 SH 27 (SUTURE) ×4
SUT VIC AB 3-0 SH 27X BRD (SUTURE) ×2 IMPLANT
SUT VICRYL 4-0 PS2 18IN ABS (SUTURE) ×3 IMPLANT
SYR CONTROL 10ML LL (SYRINGE) IMPLANT
WATER STERILE IRR 1000ML POUR (IV SOLUTION) ×3 IMPLANT

## 2015-08-29 NOTE — Progress Notes (Signed)
Report given to robin roberts rn as caregiver 

## 2015-08-29 NOTE — Interval H&P Note (Signed)
History and Physical Interval Note:  08/29/2015 9:40 AM  Eric Hinton  has presented today for surgery, with the diagnosis of Right carotid artery stenosis I65.21  The various methods of treatment have been discussed with the patient and family. After consideration of risks, benefits and other options for treatment, the patient has consented to  Procedure(s): ENDARTERECTOMY CAROTID (Right) as a surgical intervention .  The patient's history has been reviewed, patient examined, no change in status, stable for surgery.  I have reviewed the patient's chart and labs.  Questions were answered to the patient's satisfaction.     Curt Jews

## 2015-08-29 NOTE — H&P (View-Only) (Signed)
Vascular and Vein Specialist of [facility]  Patient name: Eric Hinton MRN: [id number] DOB: [birth year] Sex: male  REASON FOR VISIT: Continued discussion of carotid disease.  HPI: Eric Hinton is a 75 y.o. male here today for discussion of recent CT angiogram of his neck for further evaluation of carotid disease. Since my last visit with him several weeks ago he has had another event of right eye visual changes. Reports that he was mowing his yard on a riding mower and had approximate 4 hours of very blurred vision which is spontaneously resolved. Denies any right or left side focal symptoms other than vision changes.  Past Medical History  Diagnosis Date  . H/O Squamous cell lung cancer ([place]) 07/15/2015  . Hypertension   . Hyperlipidemia     Family History  Problem Relation Age of Onset  . Cancer Mother     cancer  . Stroke Father   . Heart disease Father     SOCIAL HISTORY: Social History  Substance Use Topics  . Smoking status: Current Every Day Smoker    Types: Cigarettes  . Smokeless tobacco: Never Used  . Alcohol Use: 0.0 oz/week    0 Standard drinks or equivalent per week    Allergies  Allergen Reactions  . Wellbutrin [Bupropion] Hives    Current Outpatient Prescriptions  Medication Sig Dispense Refill  . aspirin 325 MG tablet Take 325 mg by mouth daily.    Marland Kitchen atorvastatin (LIPITOR) 10 MG tablet Take 10 mg by mouth daily.    . Cyanocobalamin (VITAMIN B-12) 2500 MCG SUBL Place 2,500 mcg under the tongue daily.    Marland Kitchen latanoprost (XALATAN) 0.005 % ophthalmic solution Place 1 drop into both eyes at bedtime.    . valsartan (DIOVAN) 320 MG tablet Take 320 mg by mouth daily.     No current facility-administered medications for this visit.    REVIEW OF SYSTEMS:  '[X]'$  denotes positive finding, '[ ]'$  denotes negative finding Cardiac  Comments:  Chest pain or chest pressure:    Shortness of breath upon exertion:    Short of breath when lying flat:    Irregular  heart rhythm:        Vascular    Pain in calf, thigh, or hip brought on by ambulation:    Pain in feet at night that wakes you up from your sleep:     Blood clot in your veins:    Leg swelling:         Pulmonary    Oxygen at home:    Productive cough:     Wheezing:         Neurologic    Sudden weakness in arms or legs:     Sudden numbness in arms or legs:     Sudden onset of difficulty speaking or slurred speech:    Temporary loss of vision in one eye:  x   Problems with dizziness:         Gastrointestinal    Blood in stool:     Vomited blood:         Genitourinary    Burning when urinating:     Blood in urine:        Psychiatric    Major depression:         Hematologic    Bleeding problems:    Problems with blood clotting too easily:        Skin    Rashes or ulcers:  Constitutional    Fever or chills:      PHYSICAL EXAM: Filed Vitals:   08/23/15 1141 08/23/15 1144  BP: 140/83 130/79  Pulse: 84   Height: 5' 5.5" (1.664 m)   Weight: 196 lb 14.4 oz (89.313 kg)   SpO2: 95%     GENERAL: The patient is a well-nourished male, in no acute distress. The vital signs are documented above. PULMONARY: There is good air exchange   MUSCULOSKELETAL: There are no major deformities or cyanosis. NEUROLOGIC: No focal weakness or paresthesias are detected. SKIN: There are no ulcers or rashes noted. PSYCHIATRIC: The patient has a normal affect.  DATA:  CT scan was reviewed and discussed with the patient. I reviewed the actual films with the patient and his wife present as well. This does show greater than 90% left internal carotid artery stenosis. This is asymptomatic. On the right he has at least 50-60% stenosis in his internal carotid artery. There is irregular soft plaque that is protruding into the origin of the internal carotid artery.  MEDICAL ISSUES: Discusses finding at length with the patient. Concerning regarding his ongoing transient visual changes in the  right eye. Explained would recommend eventual left endarterectomy for his greater than 90% stenosis. And more concern regarding his right stenosis. Although this is in the 50-60% range, he continues to have right eye visual changes. I have a phone call into his ophthalmologist Dr. Iona Hansen. If no other source for his transient visual loss can be found I would recommend right endarterectomy. Following recovery I would recommend left endarterectomy for his high-grade critical left carotid stenosis. We will contact him following my discussion with ophthalmology    Seeley [name] [facility] of [facility] [phone]    I spoke by telephone with Dr.Haines. He reports that he did not see specific Hollenhorst plaques in the eye. He didn't see evidence of retinal ischemia which may be coming from embolic disease. Did not know of any other likely cause for his recurrent transient blindness episodes. Will proceed with right carotid endarterectomy at the patient's earliest convenience.

## 2015-08-29 NOTE — Anesthesia Procedure Notes (Signed)
Procedure Name: Intubation Date/Time: 08/29/2015 7:52 AM Performed by: Myna Bright Pre-anesthesia Checklist: Patient identified, Emergency Drugs available, Suction available and Patient being monitored Patient Re-evaluated:Patient Re-evaluated prior to inductionOxygen Delivery Method: Circle system utilized Preoxygenation: Pre-oxygenation with 100% oxygen Intubation Type: IV induction Ventilation: Mask ventilation without difficulty Laryngoscope Size: Mac and 4 Grade View: Grade II Tube type: Oral Tube size: 7.5 mm Number of attempts: 1 Airway Equipment and Method: Stylet and LTA kit utilized Placement Confirmation: ETT inserted through vocal cords under direct vision,  positive ETCO2 and breath sounds checked- equal and bilateral Secured at: 22 cm Tube secured with: Tape Dental Injury: Teeth and Oropharynx as per pre-operative assessment

## 2015-08-29 NOTE — Progress Notes (Signed)
Patient arrived to [place], upon arrival pt's BP 80's-100's/50-60's. Pt A&Ox4 with equal strong grips and symmetrical smile. Dopamine gtt titrated. Family at bedside. Incision covered with surgical dressing, dressing with bloody drainage. Incision soft with no palpable hematoma. Will continue to monitor.

## 2015-08-29 NOTE — Progress Notes (Signed)
  Vascular and Vein Specialists Day of Surgery Note  Subjective:  Patient seen in PACU. Doing ok.   Filed Vitals:   08/29/15 1230 08/29/15 1245  BP: 71/46 70/47  Pulse: 63 58  Temp:    Resp: 12 15   SBP a-line: 170s on dopamine gtt  Right neck incision dressed. No palpable hematoma. 5/5 strength upper and lower extremities bilaterally, smile symmetric, tongue midline  Assessment/Plan:  This is a 75 y.o. male who is s/p right carotid endarterectomy  Hypotensive in PACU, did not respond to fluid bolus.  Dopamine started. BP now stable. Neuro exam intact. Right neck without hematoma.  To 3S when bed available.    Virgina Jock, [place] Pager: [phone] 08/29/2015 1:18 PM

## 2015-08-29 NOTE — Progress Notes (Signed)
Dr early at bedside aware of low bp and aware of ns bolus iv bolus started

## 2015-08-29 NOTE — Anesthesia Preprocedure Evaluation (Signed)
Anesthesia Evaluation  Patient identified by MRN, date of birth, ID band Patient awake    Reviewed: Allergy & Precautions, NPO status , Patient's Chart, lab work & pertinent test results  History of Anesthesia Complications Negative for: history of anesthetic complications  Airway Mallampati: II  TM Distance: >3 FB Neck ROM: Full    Dental  (+) Poor Dentition, Missing   Pulmonary COPD, Current Smoker,  H/o lung cancer per patient   breath sounds clear to auscultation       Cardiovascular hypertension, Pt. on medications (-) angina(-) Past MI and (-) CHF  Rhythm:Regular     Neuro/Psych negative neurological ROS  negative psych ROS   GI/Hepatic Neg liver ROS,   Endo/Other  Morbid obesity  Renal/GU Renal InsufficiencyRenal disease     Musculoskeletal  (+) Arthritis ,   Abdominal   Peds  Hematology negative hematology ROS (+)   Anesthesia Other Findings   Reproductive/Obstetrics                             Anesthesia Physical Anesthesia Plan  ASA: III  Anesthesia Plan: General   Post-op Pain Management:    Induction: Intravenous  Airway Management Planned: Oral ETT  Additional Equipment: Arterial line  Intra-op Plan:   Post-operative Plan: Extubation in OR  Informed Consent: I have reviewed the patients History and Physical, chart, labs and discussed the procedure including the risks, benefits and alternatives for the proposed anesthesia with the patient or authorized representative who has indicated his/her understanding and acceptance.   Dental advisory given  Plan Discussed with: CRNA and Surgeon  Anesthesia Plan Comments:         Anesthesia Quick Evaluation

## 2015-08-29 NOTE — Op Note (Signed)
     Patient name: Eric Hinton MRN: [id number] DOB: 1940/12/31 Sex: male  08/29/2015 Pre-operative Diagnosis: Symptomatic right carotid stenosis Post-operative diagnosis:  Same Surgeon:  Rosetta Posner, M.D. Assistants:  Pablo Ledger Procedure:    right carotid Endarterectomy with Dacron patch angioplasty Anesthesia:  General Blood Loss:  See anesthesia record Specimens:  Carotid Plaque to pathology  Indications for surgery:  Recurrent episodes amaurosis fugax right eye  Procedure in detail:  The patient was taken to the operating and placed in the supine position. The neck was prepped and draped in the usual sterile fashion. An incision was made anterior to the sternocleidomastoid muscle and continued with electrocautery through the platysma muscle. The muscle was retracted posteriorly and the carotid sheath was opened. The facial vein was ligated with 2-0 silk ties and divided. The common carotid artery was encircled with an umbilical tape and Rummel tourniquet. Dissection was continued onto the carotid bifurcation. The superior thyroid artery was controlled with a 2-0 silk Potts tie. The external carotid organ was encircled with a vessel loop and the internal carotid was encircled with umbilical tape and Rummel tourniquet. The hypoglossal and vagus nerves were identified and preserved.  The patient was given systemic heparinization. After adequate circulation time, the internal,external and common carotid arteries were occluded. The common carotid was opened with an 11 blade and the arteriotomy was continued with Potts scissors onto the internal carotid artery. A 10 shunt was passed up the internal carotid artery, allowed to back bleed, and then passed down the common carotid artery. The shunt was secured with Rummel tourniquet. The endarterectomy was begun on the common carotid artery  plaque was divided proximally with Potts scissors. The endarterectomy was continued onto the carotid bifurcation.  The external carotid was endarterectomized by eversion technique and the internal carotid artery was endarterectomized in an open fashion. Remaining debris was removed from the endarterectomy plane. A Dacron patch was brought to the field and sewn as a patch angioplasty. Prior to completing the anastomosis, the shunt was removed and the usual flushing maneuvers were undertaken. The anastomosis was then completed and flow was restored first to the external and then the internal carotid artery. Excellent flow characteristics were noted with hand-held Doppler in the internal and external carotid arteries.  The patient was given protamine to reverse the heparin. Hemostasis was obtained with electrocautery. The wounds were irrigated with saline. The wound was closed by first reapproximating the sternocleidomastoid muscle over the carotid artery with interrupted 3-0 Vicryl sutures. Next, the platysma was closed with a running 3-0 Vicryl suture. The skin was closed with a 4-0 subcuticular Vicryl suture. Benzoin and Steri-Strips were applied to the incision. A sterile dressing was placed over the incision. All sponge and needle counts were correct. The patient was awakened in the operating room, neurologically intact. They were transferred to the PACU in stable condition.  Carotid stenosis at surgery: 60%  Disposition:  To PACU in stable condition,neurologically intact  Relevant Operative Details:  60% mixed calcified and noncalcified plaque. No severe ulceration  Rosetta Posner, M.D. [facility] [place] Office: [phone] Pager:  [phone]

## 2015-08-29 NOTE — Transfer of Care (Signed)
Immediate Anesthesia Transfer of Care Note  Patient: Eric Hinton  Procedure(s) Performed: Procedure(s): RIGHT CAROTID ENDARTERECTOMY WITH PATCH ANGIOPLASTY (Right)  Patient Location: PACU  Anesthesia Type:General  Level of Consciousness: awake, alert  and oriented  Airway & Oxygen Therapy: Patient Spontanous Breathing and Patient connected to nasal cannula oxygen  Post-op Assessment: Report given to RN and Post -op Vital signs reviewed and stable  Post vital signs: Reviewed and stable  Last Vitals:  Filed Vitals:   08/29/15 0719  BP: 147/72  Pulse: 85  Temp: 36.8 C  Resp: 20    Last Pain: There were no vitals filed for this visit.    Patients Stated Pain Goal: 2 (79/44/46 1901)  Complications: No apparent anesthesia complications

## 2015-08-30 ENCOUNTER — Encounter (HOSPITAL_COMMUNITY): Payer: Self-pay | Admitting: Vascular Surgery

## 2015-08-30 LAB — BASIC METABOLIC PANEL
ANION GAP: 9 (ref 5–15)
BUN: 11 mg/dL (ref 6–20)
CHLORIDE: 108 mmol/L (ref 101–111)
CO2: 21 mmol/L — AB (ref 22–32)
CREATININE: 1.16 mg/dL (ref 0.61–1.24)
Calcium: 8.5 mg/dL — ABNORMAL LOW (ref 8.9–10.3)
GFR calc non Af Amer: 60 mL/min — ABNORMAL LOW (ref >60)
Glucose, Bld: 114 mg/dL — ABNORMAL HIGH (ref 65–99)
Potassium: 3.6 mmol/L (ref 3.5–5.1)
SODIUM: 138 mmol/L (ref 135–145)

## 2015-08-30 LAB — CBC
HEMATOCRIT: 34.5 % — AB (ref 39.0–52.0)
HEMOGLOBIN: 11.5 g/dL — AB (ref 13.0–17.0)
MCH: 31.2 pg (ref 26.0–34.0)
MCHC: 33.3 g/dL (ref 30.0–36.0)
MCV: 93.5 fL (ref 78.0–100.0)
Platelets: 153 10*3/uL (ref 150–400)
RBC: 3.69 MIL/uL — AB (ref 4.22–5.81)
RDW: 12.5 % (ref 11.5–15.5)
WBC: 7 10*3/uL (ref 4.0–10.5)

## 2015-08-30 NOTE — Care Management Note (Signed)
Case Management Note  Patient Details  Name: Eric Hinton MRN: [id number] Date of Birth: 1940/11/16  Subjective/Objective:      s/p: right carotid endarterectomy              Action/Plan: Discharge Planning: AVS reviewed NCM spoke to pt and wife, Eric Hinton at bedside. Can afford medications. No NCM needs identified. Wife is a retired Therapist, sports.   Eric Ly MD  Expected Discharge Date:  08/30/2015               Expected Discharge Plan:  Home/Self Care  In-House Referral:  NA  Discharge planning Services  CM Consult  Post Acute Care Choice:  NA Choice offered to:  NA  DME Arranged:  N/A DME Agency:  NA  HH Arranged:  NA HH Agency:  NA  Status of Service:  Completed, signed off  Medicare Important Message Given:    Date Medicare IM Given:    Medicare IM give by:    Date Additional Medicare IM Given:    Additional Medicare Important Message give by:     If discussed at [place] of Stay Meetings, dates discussed:    Additional Comments:  Eric Rasher, RN 08/30/2015, 10:17 AM

## 2015-08-30 NOTE — Anesthesia Postprocedure Evaluation (Signed)
Anesthesia Post Note  Patient: Eric Hinton  Procedure(s) Performed: Procedure(s) (LRB): RIGHT CAROTID ENDARTERECTOMY WITH PATCH ANGIOPLASTY (Right)  Patient location during evaluation: PACU Anesthesia Type: General Level of consciousness: awake Pain management: pain level controlled Vital Signs Assessment: post-procedure vital signs reviewed and stable Respiratory status: spontaneous breathing Cardiovascular status: stable Postop Assessment: no signs of nausea or vomiting Anesthetic complications: no    Last Vitals:  Filed Vitals:   08/30/15 0615 08/30/15 0800  BP: 104/49 99/53  Pulse: 61 57  Temp:  36.4 C  Resp: 16 22    Last Pain:  Filed Vitals:   08/30/15 0855  PainSc: 0-No pain                 Alleigh Mollica

## 2015-08-30 NOTE — Progress Notes (Signed)
Pt given discharge packet. Education concerning medication and follow up care of incision given. Patient and spouse state no further questions at this time. Will continue to monitor.

## 2015-08-30 NOTE — Progress Notes (Addendum)
  [facility] Progress Note  Subjective  - POD #1  No complaints.   Objective Filed Vitals:   08/30/15 0600 08/30/15 0615  BP: 94/61 104/49  Pulse: 57 61  Temp:    Resp: 21 16    Intake/Output Summary (Last 24 hours) at 08/30/15 0727 Last data filed at 08/30/15 0601  Gross per 24 hour  Intake 5040.45 ml  Output   1800 ml  Net 3240.45 ml   Right neck incision c/d/i. No hematoma.  5/5 strength upper and lower extremities. No smile asymmetry. Tongue without deviation.   Assessment/Planning: 75 y.o. male is s/p: right carotid endarterectomy 1 Day Post-Op   Right neck without hematoma. Neuro exam intact. Off dopamine gtt. BP stable. Has ambulated and voided. D/c home today.  Advised patient to hold home BP meds if SBP<100.  Modified rankin: 0 VQI: on ASA and statin  Alvia Grove 08/30/2015 7:27 AM --  Laboratory CBC    Component Value Date/Time   WBC 7.0 08/30/2015 0336   HGB 11.5* 08/30/2015 0336   HCT 34.5* 08/30/2015 0336   PLT 153 08/30/2015 0336    BMET    Component Value Date/Time   NA 138 08/30/2015 0336   K 3.6 08/30/2015 0336   CL 108 08/30/2015 0336   CO2 21* 08/30/2015 0336   GLUCOSE 114* 08/30/2015 0336   BUN 11 08/30/2015 0336   CREATININE 1.16 08/30/2015 0336   CALCIUM 8.5* 08/30/2015 0336   GFRNONAA 60* 08/30/2015 0336   GFRAA >60 08/30/2015 0336    COAG Lab Results  Component Value Date   INR 1.12 08/25/2015   No results found for: PTT  Antibiotics Anti-infectives    Start     Dose/Rate Route Frequency Ordered Stop   08/29/15 2200  cefUROXime (ZINACEF) 1.5 g in dextrose 5 % 50 mL IVPB     1.5 g 100 mL/hr over 30 Minutes Intravenous Every 12 hours 08/29/15 1539 08/30/15 2159   08/28/15 1355  cefUROXime (ZINACEF) 1.5 g in dextrose 5 % 50 mL IVPB     1.5 g 100 mL/hr over 30 Minutes Intravenous 30 min pre-op 08/28/15 1355 08/29/15 [place], PA-C [facility] Office:  [phone] Pager: [phone] 08/30/2015 7:27 AM     I have examined the patient, reviewed and agree with above.  Curt Jews, MD 08/30/2015 9:16 AM

## 2015-08-30 NOTE — Progress Notes (Signed)
Dr early up on floor and said it was ok to wean dopamine to off if systolic above 90 and pt asymtomatic

## 2015-09-01 ENCOUNTER — Telehealth: Payer: Self-pay

## 2015-09-01 ENCOUNTER — Telehealth: Payer: Self-pay | Admitting: Vascular Surgery

## 2015-09-01 NOTE — Telephone Encounter (Signed)
-----   Message from Mena Goes, RN sent at 08/29/2015  1:43 PM EDT ----- Regarding: schedule   ----- Message -----    From: Alvia Grove, PA-C    Sent: 08/29/2015  11:44 AM      To: [facility] Charge Pool  S/p right CEA 08/29/15  F/u with Dr. Donnetta Hutching in 2 weeks  Thanks Maudie Mercury

## 2015-09-01 NOTE — Telephone Encounter (Signed)
Phone call from pt's wife.  Reported pt. Woke up today with increased swelling beneath his chin and lower neck region.  Reported the swelling is soft, not firm.  Reported that there is no redness or fever.  Denied any c/o swallowing or breathing difficulties.  Stated after he was up awhile, the swelling had gone down some.  Reported the incision is intact.  Encouraged to continue to monitor.  Advised to check for pulsatile mass with gentle palpation.  Wife reported she will check this when she returns home, as she is not with the pt. At this time.  Encouraged to call back with any concerns.  Verb. Understanding.

## 2015-09-01 NOTE — Telephone Encounter (Signed)
Sched appt 6/20 at 8:45. Pt wife called to resch appt to 6/27 at 11:30.

## 2015-09-02 NOTE — Discharge Summary (Signed)
[facility] Discharge Summary  Eric Hinton 1940-03-30 75 y.o. male  [id number]  Admission Date: 08/29/2015  Discharge Date: 08/30/2015  Physician: Curt Jews, MD  Admission Diagnosis: Right carotid artery stenosis I65.21  HPI:   This is a 75 y.o. male who presented for discussion of recent CT angiogram of his neck for further evaluation of carotid disease. Since my last visit with him several weeks ago he has had another event of right eye visual changes. Reports that he was mowing his yard on a riding mower and had approximate 4 hours of very blurred vision which is spontaneously resolved. Denies any right or left side focal symptoms other than vision changes.  Hospital Course:  The patient was admitted to the hospital and taken to the operating room on 08/29/2015 and underwent right carotid endarterectomy.  The patient tolerated the procedure well and was transported to the PACU in stable condition.  The patient had some hypotension postoperatively. He was given a fluid bolus of normal saline. His blood pressure did not respond to this and he was started on a dopamine drip.  By POD 1, the patient's neuro status was intact. His dopamine drip was weaned off. His blood pressure was stable. He was advised to take his blood pressure at home. If his systolic blood pressure was less than 100, he was advised to hold his blood pressure medications. His right neck incision was clean and intact without evidence of hematoma. He was ambulating, tolerating a diet and voiding without difficulty. He was discharged on postop day 1 in good condition.   Discharge Instructions:   The patient is discharged to home with extensive instructions on wound care and progressive ambulation.  They are instructed not to drive or perform any heavy lifting until returning to see the physician in his office.  Discharge Instructions    Call MD for:  redness, tenderness, or signs of infection  (pain, swelling, bleeding, redness, odor or green/yellow discharge around incision site)    Complete by:  As directed      Call MD for:  severe or increased pain, loss or decreased feeling  in affected limb(s)    Complete by:  As directed      Call MD for:  temperature >100.5    Complete by:  As directed      Discharge wound care:    Complete by:  As directed   Wash wound daily with soap and water and pat dry. You may wash over the strips. Do not pull the strips off, they will fall off on their own.  Do not apply any creams or ointments on your incisions.     Driving Restrictions    Complete by:  As directed   No driving for 1 week     Increase activity slowly    Complete by:  As directed   Walk with assistance use walker or cane as needed     Lifting restrictions    Complete by:  As directed   No lifting for 2 weeks     Resume previous diet    Complete by:  As directed            Discharge Diagnosis:  Right carotid artery stenosis I65.21  Secondary Diagnosis: Patient Active Problem List   Diagnosis Date Noted  . Symptomatic carotid artery stenosis 08/29/2015  . H/O Squamous cell lung cancer ([place]) 07/15/2015   Past Medical History  Diagnosis Date  . Hypertension   .  Hyperlipidemia     pt denies  . H/O Squamous cell lung cancer ([place]) 07/15/2015    skin, prostate  . COPD (chronic obstructive pulmonary disease) ([place])   . Shortness of breath dyspnea     with exertion  . Arthritis     back      Medication List    TAKE these medications        aspirin 325 MG tablet  Take 325 mg by mouth daily.     atorvastatin 10 MG tablet  Commonly known as:  LIPITOR  Take 10 mg by mouth daily.     latanoprost 0.005 % ophthalmic solution  Commonly known as:  XALATAN  Place 1 drop into both eyes at bedtime.     menthol-zinc oxide powder  Apply 1 application topically daily as needed (for itching).     naproxen sodium 220 MG tablet  Commonly known as:  ANAPROX  Take 440 mg  by mouth 2 (two) times daily as needed (for pain).     oxyCODONE-acetaminophen 5-325 MG tablet  Commonly known as:  ROXICET  Take 1 tablet by mouth every 6 (six) hours as needed.     valsartan 320 MG tablet  Commonly known as:  DIOVAN  Take 320 mg by mouth daily.     Vitamin B-12 2500 MCG Subl  Place 2,500 mcg under the tongue daily.        Percocet #15 No Refill  Disposition: Home  Patient's condition: is Good  Follow up: 1. Dr.  Donnetta Hutching in 2 weeks.   Virgina Jock, PA-C [facility] [phone]  --- For [facility] use --- Instructions: Press F2 to tab through selections.  Delete question if not applicable.   Modified Rankin score at D/C (0-6): 0  IV medication needed for:  1. Hypertension: No 2. Hypotension: Yes  Post-op Complications: No  1. Post-op CVA or TIA: No  2. CN injury: No  3. Myocardial infarction: No  4.  CHF: No  5.  Dysrhythmia (new): No  6. Wound infection: No  7. Reperfusion symptoms: No  8. Return to OR: No  Discharge medications: Statin use:  Yes If No: '[ ]'$  For Medical reasons, '[ ]'$  Non-compliant, '[ ]'$  Not-indicated ASA use:  Yes  If No: '[ ]'$  For Medical reasons, '[ ]'$  Non-compliant, '[ ]'$  Not-indicated Beta blocker use:  No If No: '[ ]'$  For Medical reasons, '[ ]'$  Non-compliant, '[ ]'$  Not-indicated ACE-Inhibitor use:  No, on ARB If No: '[ ]'$  For Medical reasons, '[ ]'$  Non-compliant, '[ ]'$  Not-indicated P2Y12 Antagonist use: No, '[ ]'$  Plavix, '[ ]'$  Plasugrel, '[ ]'$  Ticlopinine, '[ ]'$  Ticagrelor, '[ ]'$  Other, '[ ]'$  No for medical reason, '[ ]'$  Non-compliant, [x ] Not-indicated Anti-coagulant use:  No, '[ ]'$  Warfarin, '[ ]'$  Rivaroxaban, '[ ]'$  Dabigatran, '[ ]'$  Other, '[ ]'$  No for medical reason, '[ ]'$  Non-compliant, [x ] Not-indicated

## 2015-09-13 ENCOUNTER — Encounter: Payer: Medicare Other | Admitting: Vascular Surgery

## 2015-09-14 ENCOUNTER — Encounter: Payer: Self-pay | Admitting: Vascular Surgery

## 2015-09-20 ENCOUNTER — Encounter: Payer: Self-pay | Admitting: Vascular Surgery

## 2015-09-20 ENCOUNTER — Ambulatory Visit (INDEPENDENT_AMBULATORY_CARE_PROVIDER_SITE_OTHER): Payer: Medicare Other | Admitting: Vascular Surgery

## 2015-09-20 VITALS — BP 124/70 | HR 101 | Temp 98.0°F | Resp 18 | Ht 65.0 in | Wt 196.0 lb

## 2015-09-20 DIAGNOSIS — I6523 Occlusion and stenosis of bilateral carotid arteries: Secondary | ICD-10-CM

## 2015-09-20 NOTE — Progress Notes (Signed)
   Patient name: Eric Hinton MRN: [id number] DOB: [birth year] Sex: male  REASON FOR VISIT: Follow-up right carotid endarterectomy 08/29/2015  HPI: Eric Hinton is a 75 y.o. male here for follow-up of right carotid endarterectomy. He had moderate to severe right carotid stenosis and multiple visual changes. He does have diminished vision in his right eye but reports that he has had no more of the episodes where he would have a cloudy sensation that would come spontaneously and resolve since his surgery. His had no other neurologic deficits.  Current Outpatient Prescriptions  Medication Sig Dispense Refill  . aspirin 325 MG tablet Take 325 mg by mouth daily.    Marland Kitchen atorvastatin (LIPITOR) 10 MG tablet Take 10 mg by mouth daily.    . Cyanocobalamin (VITAMIN B-12) 2500 MCG SUBL Place 2,500 mcg under the tongue daily.    Marland Kitchen latanoprost (XALATAN) 0.005 % ophthalmic solution Place 1 drop into both eyes at bedtime.    Marland Kitchen menthol-zinc oxide (GOLD BOND) powder Apply 1 application topically daily as needed (for itching).    . naproxen sodium (ANAPROX) 220 MG tablet Take 440 mg by mouth 2 (two) times daily as needed (for pain).    . valsartan (DIOVAN) 320 MG tablet Take 320 mg by mouth daily.    Marland Kitchen oxyCODONE-acetaminophen (ROXICET) 5-325 MG tablet Take 1 tablet by mouth every 6 (six) hours as needed. (Patient not taking: Reported on 09/20/2015) 15 tablet 0   No current facility-administered medications for this visit.       PHYSICAL EXAM: Filed Vitals:   09/20/15 1107  BP: 124/70  Pulse: 101  Temp: 98 F (36.7 C)  TempSrc: Oral  Resp: 18  Height: '5\' 5"'$  (1.651 m)  Weight: 196 lb (88.905 kg)  SpO2: 97%    GENERAL: The patient is a well-nourished male, in no acute distress. The vital signs are documented above. Right neck incision is well-healed. Has no carotid bruits bilaterally. Grossly intact neurologically.  MEDICAL ISSUES: Stable status post right  carotid endarterectomy. He does have known severe asymptomatic carotid stenosis on the left inches been confirmed with CT angiogram. Have recommended elective endarterectomy and he wishes to proceed with this first week after July 4. Will plan surgery at his convenience. Understands the potential risk for surgery. Also explained that he will have significant numbness between the level of the 2 incisions which will improve as these sensory nerve regrowth over time.  Rosetta Posner, MD FACS [facility] Tel [phone] Pager [phone]

## 2015-09-28 ENCOUNTER — Other Ambulatory Visit: Payer: Self-pay

## 2015-10-03 ENCOUNTER — Other Ambulatory Visit (HOSPITAL_COMMUNITY): Payer: Medicare Other

## 2015-10-05 ENCOUNTER — Encounter (HOSPITAL_COMMUNITY)
Admission: RE | Admit: 2015-10-05 | Discharge: 2015-10-05 | Disposition: A | Discharge status: Home / Self Care | Payer: Medicare Other | Source: Ambulatory Visit | Attending: Vascular Surgery | Admitting: Vascular Surgery

## 2015-10-05 ENCOUNTER — Encounter (HOSPITAL_COMMUNITY): Payer: Self-pay

## 2015-10-05 LAB — URINALYSIS, ROUTINE W REFLEX MICROSCOPIC
Bilirubin Urine: NEGATIVE
GLUCOSE, UA: NEGATIVE mg/dL
Hgb urine dipstick: NEGATIVE
KETONES UR: NEGATIVE mg/dL
LEUKOCYTES UA: NEGATIVE
Nitrite: NEGATIVE
PH: 6 (ref 5.0–8.0)
Protein, ur: NEGATIVE mg/dL
Specific Gravity, Urine: 1.02 (ref 1.005–1.030)

## 2015-10-05 LAB — COMPREHENSIVE METABOLIC PANEL
ALT: 20 U/L (ref 17–63)
AST: 19 U/L (ref 15–41)
Albumin: 3.8 g/dL (ref 3.5–5.0)
Alkaline Phosphatase: 54 U/L (ref 38–126)
Anion gap: 7 (ref 5–15)
BUN: 12 mg/dL (ref 6–20)
CHLORIDE: 105 mmol/L (ref 101–111)
CO2: 28 mmol/L (ref 22–32)
CREATININE: 1.43 mg/dL — AB (ref 0.61–1.24)
Calcium: 9.9 mg/dL (ref 8.9–10.3)
GFR, EST AFRICAN AMERICAN: 54 mL/min — AB (ref >60)
GFR, EST NON AFRICAN AMERICAN: 46 mL/min — AB (ref >60)
Glucose, Bld: 104 mg/dL — ABNORMAL HIGH (ref 65–99)
Potassium: 4 mmol/L (ref 3.5–5.1)
Sodium: 140 mmol/L (ref 135–145)
Total Bilirubin: 0.7 mg/dL (ref 0.3–1.2)
Total Protein: 7 g/dL (ref 6.5–8.1)

## 2015-10-05 LAB — SURGICAL PCR SCREEN
MRSA, PCR: NEGATIVE
Staphylococcus aureus: NEGATIVE

## 2015-10-05 LAB — CBC
HCT: 43 % (ref 39.0–52.0)
Hemoglobin: 14.3 g/dL (ref 13.0–17.0)
MCH: 32 pg (ref 26.0–34.0)
MCHC: 33.3 g/dL (ref 30.0–36.0)
MCV: 96.2 fL (ref 78.0–100.0)
PLATELETS: 204 10*3/uL (ref 150–400)
RBC: 4.47 MIL/uL (ref 4.22–5.81)
RDW: 13.3 % (ref 11.5–15.5)
WBC: 5.2 10*3/uL (ref 4.0–10.5)

## 2015-10-05 LAB — TYPE AND SCREEN
ABO/RH(D): A POS
Antibody Screen: NEGATIVE

## 2015-10-05 LAB — APTT: aPTT: 31 seconds (ref 24–37)

## 2015-10-05 LAB — PROTIME-INR
INR: 0.99 (ref 0.00–1.49)
PROTHROMBIN TIME: 13.3 s (ref 11.6–15.2)

## 2015-10-05 NOTE — Pre-Procedure Instructions (Signed)
Eric Hinton  10/05/2015      [facility] - [place], [place] - [address] AT [place]. [place] [address] Phone: [phone] Fax: [phone]  [facility] [place], [place] [address] [place] [address] Phone: [phone] Fax: [phone]    Your procedure is scheduled on July 14  Report to [facility] at 0730 A.M.  Call this number if you have problems the morning of surgery:  [phone]   Remember:  Do not eat food or drink liquids after midnight.   Take these medicines the morning of surgery with A SIP OF WATER eye drops, oxycodone-acetaminophen   Do not wear jewelry.  Do not wear lotions, powders, or cologne.  You may NOT wear deoderant.  Men may shave face and neck.  Do not bring valuables to the hospital.  [facility] is not responsible for any belongings or valuables.  Contacts, dentures or bridgework may not be worn into surgery.  Leave your suitcase in the car.  After surgery it may be brought to your room.  For patients admitted to the hospital, discharge time will be determined by your treatment team.  Patients discharged the day of surgery will not be allowed to drive home.    Special instructions:   [place]- Preparing For Surgery  Before surgery, you can play an important role. Because skin is not sterile, your skin needs to be as free of germs as possible. You can reduce the number of germs on your skin by washing with CHG (chlorahexidine gluconate) Soap before surgery.  CHG is an antiseptic cleaner which kills germs and bonds with the skin to continue killing germs even after washing.  Please do not use if you have an allergy to CHG or antibacterial soaps. If your skin becomes reddened/irritated stop using the CHG.  Do not shave (including legs and underarms) for at least 48 hours prior to first CHG  shower. It is OK to shave your face.  Please follow these instructions carefully.   1. Shower the NIGHT BEFORE SURGERY and the MORNING OF SURGERY with CHG.   2. If you chose to wash your hair, wash your hair first as usual with your normal shampoo.  3. After you shampoo, rinse your hair and body thoroughly to remove the shampoo.  4. Use CHG as you would any other liquid soap. You can apply CHG directly to the skin and wash gently with a scrungie or a clean washcloth.   5. Apply the CHG Soap to your body ONLY FROM THE NECK DOWN.  Do not use on open wounds or open sores. Avoid contact with your eyes, ears, mouth and genitals (private parts). Wash genitals (private parts) with your normal soap.  6. Wash thoroughly, paying special attention to the area where your surgery will be performed.  7. Thoroughly rinse your body with warm water from the neck down.  8. DO NOT shower/wash with your normal soap after using and rinsing off the CHG Soap.  9. Pat yourself dry with a CLEAN TOWEL.   10. Wear CLEAN PAJAMAS   11. Place CLEAN SHEETS on your bed the night of your first shower and DO NOT SLEEP WITH PETS.    Day of Surgery: Do not apply any deodorants/lotions. Please wear clean clothes to the hospital/surgery center.  Please read over the following fact sheets that you were given. Pain Booklet, Coughing and Deep Breathing, Blood Transfusion Information, MRSA Information and Surgical Site Infection Prevention

## 2015-10-05 NOTE — Progress Notes (Signed)
PCP - Delphina Cahill Cardiologist - denies  Chest x-ray -  EKG - 07/18/15 Stress Test - >30 years ECHO - unsure Cardiac Cath - denies  Patient denies chest pain, fever, shortness of breath, and cough at PAT appointment  Patient was instructed by [name] office to take his aspirin the morning of surgery

## 2015-10-07 ENCOUNTER — Encounter (HOSPITAL_COMMUNITY): Payer: Self-pay | Admitting: Urology

## 2015-10-07 ENCOUNTER — Inpatient Hospital Stay (HOSPITAL_COMMUNITY): Payer: Medicare Other | Admitting: Anesthesiology

## 2015-10-07 ENCOUNTER — Encounter (HOSPITAL_COMMUNITY)
Admission: RE | Disposition: A | Discharge status: Home / Self Care | Payer: Self-pay | Source: Ambulatory Visit | Attending: Vascular Surgery

## 2015-10-07 ENCOUNTER — Inpatient Hospital Stay (HOSPITAL_COMMUNITY)
Admission: RE | Admit: 2015-10-07 | Discharge: 2015-10-08 | DRG: 027 | Disposition: A | Discharge status: Home / Self Care | Payer: Medicare Other | Source: Ambulatory Visit | Attending: Vascular Surgery | Admitting: Vascular Surgery

## 2015-10-07 DIAGNOSIS — Z79899 Other long term (current) drug therapy: Secondary | ICD-10-CM | POA: Diagnosis not present

## 2015-10-07 DIAGNOSIS — I6522 Occlusion and stenosis of left carotid artery: Secondary | ICD-10-CM | POA: Diagnosis not present

## 2015-10-07 DIAGNOSIS — F17200 Nicotine dependence, unspecified, uncomplicated: Secondary | ICD-10-CM | POA: Diagnosis present

## 2015-10-07 DIAGNOSIS — N359 Urethral stricture, unspecified: Secondary | ICD-10-CM | POA: Diagnosis present

## 2015-10-07 DIAGNOSIS — R339 Retention of urine, unspecified: Secondary | ICD-10-CM | POA: Diagnosis not present

## 2015-10-07 DIAGNOSIS — Z7982 Long term (current) use of aspirin: Secondary | ICD-10-CM

## 2015-10-07 DIAGNOSIS — I6523 Occlusion and stenosis of bilateral carotid arteries: Principal | ICD-10-CM | POA: Diagnosis present

## 2015-10-07 DIAGNOSIS — J449 Chronic obstructive pulmonary disease, unspecified: Secondary | ICD-10-CM | POA: Diagnosis not present

## 2015-10-07 DIAGNOSIS — I6521 Occlusion and stenosis of right carotid artery: Secondary | ICD-10-CM | POA: Diagnosis not present

## 2015-10-07 DIAGNOSIS — K5900 Constipation, unspecified: Secondary | ICD-10-CM | POA: Diagnosis not present

## 2015-10-07 DIAGNOSIS — I10 Essential (primary) hypertension: Secondary | ICD-10-CM | POA: Diagnosis present

## 2015-10-07 DIAGNOSIS — R338 Other retention of urine: Secondary | ICD-10-CM | POA: Diagnosis not present

## 2015-10-07 DIAGNOSIS — N99114 Postprocedural urethral stricture, male, unspecified: Secondary | ICD-10-CM | POA: Diagnosis not present

## 2015-10-07 DIAGNOSIS — M1990 Unspecified osteoarthritis, unspecified site: Secondary | ICD-10-CM | POA: Diagnosis not present

## 2015-10-07 DIAGNOSIS — N358 Other urethral stricture: Secondary | ICD-10-CM | POA: Diagnosis not present

## 2015-10-07 DIAGNOSIS — I6529 Occlusion and stenosis of unspecified carotid artery: Secondary | ICD-10-CM | POA: Diagnosis present

## 2015-10-07 HISTORY — PX: ENDARTERECTOMY: SHX5162

## 2015-10-07 HISTORY — DX: Malignant neoplasm of prostate: C61

## 2015-10-07 HISTORY — PX: PATCH ANGIOPLASTY: SHX6230

## 2015-10-07 SURGERY — ENDARTERECTOMY, CAROTID
Anesthesia: General | Site: Neck | Laterality: Left

## 2015-10-07 MED ORDER — SODIUM CHLORIDE 0.9 % IV SOLN
0.0125 ug/kg/min | INTRAVENOUS | Status: AC
  Administered 2015-10-07: .1 ug/kg/min via INTRAVENOUS
  Filled 2015-10-07: qty 2000

## 2015-10-07 MED ORDER — PROPOFOL 10 MG/ML IV BOLUS
INTRAVENOUS | Status: AC
  Filled 2015-10-07: qty 20

## 2015-10-07 MED ORDER — GLYCOPYRROLATE 0.2 MG/ML IJ SOLN
INTRAMUSCULAR | Status: DC | PRN
  Administered 2015-10-07: 0.4 mg via INTRAVENOUS

## 2015-10-07 MED ORDER — ASPIRIN 325 MG PO TABS
325.0000 mg | ORAL_TABLET | Freq: Every day | ORAL | Status: DC
  Administered 2015-10-08: 325 mg via ORAL
  Filled 2015-10-07: qty 1

## 2015-10-07 MED ORDER — CHLORHEXIDINE GLUCONATE 4 % EX LIQD: 60.0000 mL | Freq: Once | CUTANEOUS | Status: DC

## 2015-10-07 MED ORDER — DOPAMINE-DEXTROSE 3.2-5 MG/ML-% IV SOLN
5.0000 ug/kg/min | INTRAVENOUS | Status: DC
  Administered 2015-10-07: 5 ug/kg/min via INTRAVENOUS

## 2015-10-07 MED ORDER — SUGAMMADEX SODIUM 200 MG/2ML IV SOLN
INTRAVENOUS | Status: DC | PRN
  Administered 2015-10-07: 200 mg via INTRAVENOUS

## 2015-10-07 MED ORDER — ONDANSETRON HCL 4 MG/2ML IJ SOLN
INTRAMUSCULAR | Status: AC
  Filled 2015-10-07: qty 2

## 2015-10-07 MED ORDER — FENTANYL CITRATE (PF) 100 MCG/2ML IJ SOLN
INTRAMUSCULAR | Status: AC
  Filled 2015-10-07: qty 2

## 2015-10-07 MED ORDER — MAGNESIUM HYDROXIDE 400 MG/5ML PO SUSP
30.0000 mL | Freq: Every day | ORAL | Status: DC | PRN
  Filled 2015-10-07: qty 30

## 2015-10-07 MED ORDER — OXYCODONE-ACETAMINOPHEN 5-325 MG PO TABS: 1.0000 | ORAL_TABLET | Freq: Four times a day (QID) | ORAL | Status: DC | PRN

## 2015-10-07 MED ORDER — SODIUM CHLORIDE 0.9 % IV SOLN: INTRAVENOUS | Status: DC

## 2015-10-07 MED ORDER — DEXTROSE 5 % IV SOLN
1.5000 g | INTRAVENOUS | Status: AC
  Administered 2015-10-07: 1.5 g via INTRAVENOUS
  Filled 2015-10-07: qty 1.5

## 2015-10-07 MED ORDER — SODIUM CHLORIDE 0.9 % IV SOLN
INTRAVENOUS | Status: DC
  Administered 2015-10-07: 16:00:00 via INTRAVENOUS

## 2015-10-07 MED ORDER — OXYCODONE HCL 5 MG/5ML PO SOLN: 5.0000 mg | Freq: Once | ORAL | Status: DC | PRN

## 2015-10-07 MED ORDER — BISACODYL 10 MG RE SUPP
10.0000 mg | Freq: Every day | RECTAL | Status: DC | PRN
  Administered 2015-10-08: 10 mg via RECTAL
  Filled 2015-10-07: qty 1

## 2015-10-07 MED ORDER — ONDANSETRON HCL 4 MG/2ML IJ SOLN
4.0000 mg | Freq: Four times a day (QID) | INTRAMUSCULAR | Status: DC | PRN
Start: 2015-10-07 — End: 2015-10-08

## 2015-10-07 MED ORDER — ALUM & MAG HYDROXIDE-SIMETH 200-200-20 MG/5ML PO SUSP: 15.0000 mL | ORAL | Status: DC | PRN

## 2015-10-07 MED ORDER — SODIUM CHLORIDE 0.9 % IV SOLN
500.0000 mL | Freq: Once | INTRAVENOUS | Status: AC | PRN
  Administered 2015-10-07: 500 mL via INTRAVENOUS

## 2015-10-07 MED ORDER — FENTANYL CITRATE (PF) 250 MCG/5ML IJ SOLN
INTRAMUSCULAR | Status: AC
  Filled 2015-10-07: qty 5

## 2015-10-07 MED ORDER — ONDANSETRON HCL 4 MG/2ML IJ SOLN: 4.0000 mg | Freq: Once | INTRAMUSCULAR | Status: DC | PRN

## 2015-10-07 MED ORDER — FENTANYL CITRATE (PF) 100 MCG/2ML IJ SOLN: 25.0000 ug | INTRAMUSCULAR | Status: DC | PRN

## 2015-10-07 MED ORDER — POTASSIUM CHLORIDE CRYS ER 20 MEQ PO TBCR: 20.0000 meq | EXTENDED_RELEASE_TABLET | Freq: Every day | ORAL | Status: DC | PRN

## 2015-10-07 MED ORDER — ATORVASTATIN CALCIUM 10 MG PO TABS
10.0000 mg | ORAL_TABLET | Freq: Every day | ORAL | Status: DC
  Administered 2015-10-07 – 2015-10-08 (×2): 10 mg via ORAL
  Filled 2015-10-07 (×2): qty 1

## 2015-10-07 MED ORDER — LACTATED RINGERS IV SOLN
INTRAVENOUS | Status: DC
  Administered 2015-10-07 (×3): via INTRAVENOUS

## 2015-10-07 MED ORDER — PROTAMINE SULFATE 10 MG/ML IV SOLN
INTRAVENOUS | Status: DC | PRN
  Administered 2015-10-07: 50 mg via INTRAVENOUS

## 2015-10-07 MED ORDER — GLYCOPYRROLATE 0.2 MG/ML IV SOSY
PREFILLED_SYRINGE | INTRAVENOUS | Status: AC
  Filled 2015-10-07: qty 3

## 2015-10-07 MED ORDER — ONDANSETRON HCL 4 MG/2ML IJ SOLN
4.0000 mg | Freq: Once | INTRAMUSCULAR | Status: DC | PRN
Start: 2015-10-07 — End: 2015-10-08

## 2015-10-07 MED ORDER — ONDANSETRON HCL 4 MG/2ML IJ SOLN
INTRAMUSCULAR | Status: DC | PRN
  Administered 2015-10-07: 4 mg via INTRAVENOUS

## 2015-10-07 MED ORDER — LIDOCAINE HCL 2 % EX GEL
1.0000 "application " | Freq: Once | CUTANEOUS | Status: DC
  Filled 2015-10-07: qty 20

## 2015-10-07 MED ORDER — HEPARIN SODIUM (PORCINE) 1000 UNIT/ML IJ SOLN
INTRAMUSCULAR | Status: DC | PRN
  Administered 2015-10-07: 8000 [IU] via INTRAVENOUS

## 2015-10-07 MED ORDER — IRBESARTAN 300 MG PO TABS
300.0000 mg | ORAL_TABLET | Freq: Every day | ORAL | Status: DC
  Filled 2015-10-07 (×2): qty 1

## 2015-10-07 MED ORDER — DOPAMINE-DEXTROSE 3.2-5 MG/ML-% IV SOLN
5.0000 ug/kg/min | INTRAVENOUS | Status: DC
  Administered 2015-10-07: 800 mg via INTRAVENOUS
  Filled 2015-10-07: qty 250

## 2015-10-07 MED ORDER — METOPROLOL TARTRATE 5 MG/5ML IV SOLN: 2.0000 mg | INTRAVENOUS | Status: DC | PRN

## 2015-10-07 MED ORDER — ACETAMINOPHEN 325 MG RE SUPP: 325.0000 mg | RECTAL | Status: DC | PRN

## 2015-10-07 MED ORDER — DOCUSATE SODIUM 100 MG PO CAPS
100.0000 mg | ORAL_CAPSULE | Freq: Every day | ORAL | Status: DC
  Administered 2015-10-08: 100 mg via ORAL
  Filled 2015-10-07: qty 1

## 2015-10-07 MED ORDER — GOLD BOND EX POWD: 1.0000 "application " | Freq: Every day | CUTANEOUS | Status: DC | PRN

## 2015-10-07 MED ORDER — LABETALOL HCL 5 MG/ML IV SOLN: 10.0000 mg | INTRAVENOUS | Status: DC | PRN

## 2015-10-07 MED ORDER — LIDOCAINE HCL (PF) 1 % IJ SOLN
INTRAMUSCULAR | Status: AC
  Filled 2015-10-07: qty 30

## 2015-10-07 MED ORDER — DEXTROSE 5 % IV SOLN
10.0000 mg | INTRAVENOUS | Status: DC | PRN
  Administered 2015-10-07: 50 ug/min via INTRAVENOUS

## 2015-10-07 MED ORDER — PHENOL 1.4 % MT LIQD
1.0000 | OROMUCOSAL | Status: DC | PRN
Start: 2015-10-07 — End: 2015-10-08

## 2015-10-07 MED ORDER — MORPHINE SULFATE (PF) 2 MG/ML IV SOLN: 2.0000 mg | INTRAVENOUS | Status: DC | PRN

## 2015-10-07 MED ORDER — LIDOCAINE HCL (CARDIAC) 20 MG/ML IV SOLN
INTRAVENOUS | Status: DC | PRN
  Administered 2015-10-07: 40 mg via INTRAVENOUS

## 2015-10-07 MED ORDER — FENTANYL CITRATE (PF) 100 MCG/2ML IJ SOLN
25.0000 ug | INTRAMUSCULAR | Status: DC | PRN
  Administered 2015-10-07 (×2): 50 ug via INTRAVENOUS

## 2015-10-07 MED ORDER — LATANOPROST 0.005 % OP SOLN
1.0000 [drp] | Freq: Every day | OPHTHALMIC | Status: DC
  Administered 2015-10-07: 1 [drp] via OPHTHALMIC
  Filled 2015-10-07: qty 2.5

## 2015-10-07 MED ORDER — HYDRALAZINE HCL 20 MG/ML IJ SOLN: 5.0000 mg | INTRAMUSCULAR | Status: DC | PRN

## 2015-10-07 MED ORDER — GUAIFENESIN-DM 100-10 MG/5ML PO SYRP: 15.0000 mL | ORAL_SOLUTION | ORAL | Status: DC | PRN

## 2015-10-07 MED ORDER — VITAMIN B-12 2500 MCG SL SUBL: 2500.0000 ug | SUBLINGUAL_TABLET | Freq: Every day | SUBLINGUAL | Status: DC

## 2015-10-07 MED ORDER — VITAMIN B-12 1000 MCG PO TABS
2500.0000 ug | ORAL_TABLET | Freq: Every day | ORAL | Status: DC
  Administered 2015-10-08: 2500 ug via ORAL
  Filled 2015-10-07: qty 3

## 2015-10-07 MED ORDER — DEXTROSE 5 % IV SOLN
1.5000 g | Freq: Two times a day (BID) | INTRAVENOUS | Status: DC
  Administered 2015-10-07: 1.5 g via INTRAVENOUS
  Filled 2015-10-07 (×2): qty 1.5

## 2015-10-07 MED ORDER — ALBUMIN HUMAN 5 % IV SOLN
INTRAVENOUS | Status: AC
  Filled 2015-10-07: qty 250

## 2015-10-07 MED ORDER — DOPAMINE-DEXTROSE 3.2-5 MG/ML-% IV SOLN
INTRAVENOUS | Status: AC
  Administered 2015-10-07: 800 mg via INTRAVENOUS
  Filled 2015-10-07: qty 250

## 2015-10-07 MED ORDER — ACETAMINOPHEN 325 MG PO TABS: 325.0000 mg | ORAL_TABLET | ORAL | Status: DC | PRN

## 2015-10-07 MED ORDER — PANTOPRAZOLE SODIUM 40 MG PO TBEC
40.0000 mg | DELAYED_RELEASE_TABLET | Freq: Every day | ORAL | Status: DC
Start: 2015-10-07 — End: 2015-10-08
  Administered 2015-10-07: 40 mg via ORAL
  Filled 2015-10-07 (×2): qty 1

## 2015-10-07 MED ORDER — ALBUMIN HUMAN 5 % IV SOLN
12.5000 g | Freq: Once | INTRAVENOUS | Status: AC
  Administered 2015-10-07: 12.5 g via INTRAVENOUS

## 2015-10-07 MED ORDER — OXYCODONE HCL 5 MG PO TABS: 5.0000 mg | ORAL_TABLET | Freq: Once | ORAL | Status: DC | PRN

## 2015-10-07 MED ORDER — SODIUM CHLORIDE 0.9 % IV SOLN
INTRAVENOUS | Status: DC | PRN
  Administered 2015-10-07: 500 mL

## 2015-10-07 MED ORDER — ROCURONIUM BROMIDE 100 MG/10ML IV SOLN
INTRAVENOUS | Status: DC | PRN
  Administered 2015-10-07: 50 mg via INTRAVENOUS

## 2015-10-07 MED ORDER — LIDOCAINE 2% (20 MG/ML) 5 ML SYRINGE
INTRAMUSCULAR | Status: AC
  Filled 2015-10-07: qty 5

## 2015-10-07 MED ORDER — FENTANYL CITRATE (PF) 100 MCG/2ML IJ SOLN
INTRAMUSCULAR | Status: DC | PRN
Start: 2015-10-07 — End: 2015-10-07
  Administered 2015-10-07: 50 ug via INTRAVENOUS

## 2015-10-07 MED ORDER — PROPOFOL 10 MG/ML IV BOLUS
INTRAVENOUS | Status: DC | PRN
  Administered 2015-10-07: 140 mg via INTRAVENOUS

## 2015-10-07 MED ORDER — 0.9 % SODIUM CHLORIDE (POUR BTL) OPTIME
TOPICAL | Status: DC | PRN
  Administered 2015-10-07: 2000 mL

## 2015-10-07 MED ORDER — OXYCODONE-ACETAMINOPHEN 5-325 MG PO TABS: 1.0000 | ORAL_TABLET | ORAL | Status: DC | PRN

## 2015-10-07 SURGICAL SUPPLY — 47 items
BENZOIN TINCTURE PRP APPL 2/3 (GAUZE/BANDAGES/DRESSINGS) ×3 IMPLANT
CANISTER SUCTION 2500CC (MISCELLANEOUS) ×3 IMPLANT
CANNULA VESSEL 3MM 2 BLNT TIP (CANNULA) IMPLANT
CATH ROBINSON RED A/P 18FR (CATHETERS) ×3 IMPLANT
CLIP LIGATING EXTRA MED SLVR (CLIP) ×3 IMPLANT
CLIP LIGATING EXTRA SM BLUE (MISCELLANEOUS) ×3 IMPLANT
CLOSURE STERI-STRIP 1/2X4 (GAUZE/BANDAGES/DRESSINGS) ×1
CLOSURE WOUND 1/2 X4 (GAUZE/BANDAGES/DRESSINGS) ×1
CLSR STERI-STRIP ANTIMIC 1/2X4 (GAUZE/BANDAGES/DRESSINGS) ×2 IMPLANT
CRADLE DONUT ADULT HEAD (MISCELLANEOUS) ×3 IMPLANT
DECANTER SPIKE VIAL GLASS SM (MISCELLANEOUS) IMPLANT
DRAIN HEMOVAC 1/8 X 5 (WOUND CARE) IMPLANT
DRSG COVADERM 4X8 (GAUZE/BANDAGES/DRESSINGS) ×3 IMPLANT
ELECT REM PT RETURN 9FT ADLT (ELECTROSURGICAL) ×3
ELECTRODE REM PT RTRN 9FT ADLT (ELECTROSURGICAL) ×1 IMPLANT
EVACUATOR SILICONE 100CC (DRAIN) IMPLANT
GAUZE SPONGE 4X4 12PLY STRL (GAUZE/BANDAGES/DRESSINGS) ×3 IMPLANT
GEL ULTRASOUND 20GR AQUASONIC (MISCELLANEOUS) IMPLANT
GLOVE BIO SURGEON STRL SZ 6.5 (GLOVE) ×4 IMPLANT
GLOVE BIO SURGEONS STRL SZ 6.5 (GLOVE) ×2
GLOVE BIOGEL PI IND STRL 6.5 (GLOVE) ×1 IMPLANT
GLOVE BIOGEL PI INDICATOR 6.5 (GLOVE) ×2
GLOVE ECLIPSE 6.5 STRL STRAW (GLOVE) ×3 IMPLANT
GLOVE SS BIOGEL STRL SZ 7.5 (GLOVE) ×1 IMPLANT
GLOVE SUPERSENSE BIOGEL SZ 7.5 (GLOVE) ×2
GOWN STRL REUS W/ TWL LRG LVL3 (GOWN DISPOSABLE) ×3 IMPLANT
GOWN STRL REUS W/TWL LRG LVL3 (GOWN DISPOSABLE) ×6
KIT BASIN OR (CUSTOM PROCEDURE TRAY) ×3 IMPLANT
KIT ROOM TURNOVER OR (KITS) ×3 IMPLANT
NEEDLE 22X1 1/2 (OR ONLY) (NEEDLE) IMPLANT
NS IRRIG 1000ML POUR BTL (IV SOLUTION) ×6 IMPLANT
PACK CAROTID (CUSTOM PROCEDURE TRAY) ×3 IMPLANT
PAD ARMBOARD 7.5X6 YLW CONV (MISCELLANEOUS) ×6 IMPLANT
PATCH HEMASHIELD 8X75 (Vascular Products) ×3 IMPLANT
SHUNT CAROTID BYPASS 10 (VASCULAR PRODUCTS) ×3 IMPLANT
SHUNT CAROTID BYPASS 12FRX15.5 (VASCULAR PRODUCTS) IMPLANT
SPONGE INTESTINAL PEANUT (DISPOSABLE) ×3 IMPLANT
STRIP CLOSURE SKIN 1/2X4 (GAUZE/BANDAGES/DRESSINGS) ×2 IMPLANT
SUT ETHILON 3 0 PS 1 (SUTURE) IMPLANT
SUT PROLENE 6 0 CC (SUTURE) ×3 IMPLANT
SUT SILK 3 0 (SUTURE)
SUT SILK 3-0 18XBRD TIE 12 (SUTURE) IMPLANT
SUT VIC AB 3-0 SH 27 (SUTURE) ×4
SUT VIC AB 3-0 SH 27X BRD (SUTURE) ×2 IMPLANT
SUT VICRYL 4-0 PS2 18IN ABS (SUTURE) ×3 IMPLANT
SYR CONTROL 10ML LL (SYRINGE) IMPLANT
WATER STERILE IRR 1000ML POUR (IV SOLUTION) ×3 IMPLANT

## 2015-10-07 NOTE — Progress Notes (Signed)
  Day of Surgery Note    Subjective:  "I'm feeling a little rough right at the moment".  C/o soreness that eased up with pain medication.  Filed Vitals:   10/07/15 0701 10/07/15 1250  BP: 142/73 87/49  Pulse: 87   Temp: 97.8 F (36.6 C) 98 F (36.7 C)  Resp: 18     Incisions:   Bandage is clean and dry Extremities:  Moving all extremities equally Cardiac:  regular Lungs:  Non labored Neuro:  In tact; tongue is midline   Assessment/Plan:  This is a 75 y.o. male who is s/p left carotid endarterectromy  -pt doing well in recovery room and neuro is in tact; tongue midline -he is requiring Dopamine for blood pressure support.  BP goal for greater than 100 and less than 630 systolic   Leontine Locket, PA-C 10/07/2015 2:29 PM

## 2015-10-07 NOTE — Progress Notes (Signed)
Charge RN Anderson Malta from [employer] attempted coude placement with no success d/t obstruction. MD notified and ordered urology consult.

## 2015-10-07 NOTE — Anesthesia Preprocedure Evaluation (Signed)
Anesthesia Evaluation  Patient identified by MRN, date of birth, ID band Patient awake    Reviewed: Allergy & Precautions, NPO status , Patient's Chart, lab work & pertinent test results  Airway Mallampati: II  TM Distance: >3 FB Neck ROM: Full    Dental  (+) Teeth Intact, Dental Advisory Given   Pulmonary Current Smoker,    breath sounds clear to auscultation       Cardiovascular hypertension,  Rhythm:Regular Rate:Normal     Neuro/Psych    GI/Hepatic   Endo/Other    Renal/GU      Musculoskeletal   Abdominal   Peds  Hematology   Anesthesia Other Findings   Reproductive/Obstetrics                             Anesthesia Physical Anesthesia Plan  ASA: III  Anesthesia Plan: General   Post-op Pain Management:    Induction: Intravenous  Airway Management Planned: Oral ETT  Additional Equipment: Arterial line  Intra-op Plan:   Post-operative Plan: Extubation in OR  Informed Consent: I have reviewed the patients History and Physical, chart, labs and discussed the procedure including the risks, benefits and alternatives for the proposed anesthesia with the patient or authorized representative who has indicated his/her understanding and acceptance.   Dental advisory given  Plan Discussed with: CRNA and Anesthesiologist  Anesthesia Plan Comments:         Anesthesia Quick Evaluation

## 2015-10-07 NOTE — Transfer of Care (Signed)
Immediate Anesthesia Transfer of Care Note  Patient: Eric Hinton  Procedure(s) Performed: Procedure(s): LEFT CAROTID ARTERY ENDARTERECTOMY (Left) With HEMASHIELD PLATINUM PATCH ANGIOPLASTY (Left)  Patient Location: PACU  Anesthesia Type:General  Level of Consciousness: awake, alert , oriented and patient cooperative  Airway & Oxygen Therapy: Patient Spontanous Breathing  Post-op Assessment: Report given to RN, Post -op Vital signs reviewed and stable and Patient moving all extremities X 4  Post vital signs: Reviewed and stable  Last Vitals:  Filed Vitals:   10/07/15 0701  BP: 142/73  Pulse: 87  Temp: 36.6 C  Resp: 18    Last Pain: There were no vitals filed for this visit.       Complications: No apparent anesthesia complications

## 2015-10-07 NOTE — Op Note (Signed)
     Patient name: Eric Hinton MRN: [id number] DOB: 01-30-1941 Sex: male  10/07/2015 Pre-operative Diagnosis: Asymptomatic left carotid stenosis Post-operative diagnosis:  Same Surgeon:  Rosetta Posner, M.D. Assistants:  Leontine Locket PA-C Procedure:    left carotid Endarterectomy with Dacron patch angioplasty Anesthesia:  General Blood Loss:  See anesthesia record Specimens:  Carotid Plaque to pathology  Indications for surgery:  Severe asymptomatic stenosis  Procedure in detail:  The patient was taken to the operating and placed in the supine position. The neck was prepped and draped in the usual sterile fashion. An incision was made anterior to the sternocleidomastoid muscle and continued with electrocautery through the platysma muscle. The muscle was retracted posteriorly and the carotid sheath was opened. The facial vein was ligated with 2-0 silk ties and divided. The common carotid artery was encircled with an umbilical tape and Rummel tourniquet. Dissection was continued onto the carotid bifurcation. The superior thyroid artery was controlled with a 2-0 silk Potts tie. The external carotid organ was encircled with a vessel loop and the internal carotid was encircled with umbilical tape and Rummel tourniquet. The hypoglossal and vagus nerves were identified and preserved.  The patient was given systemic heparinization. After adequate circulation time, the internal,external and common carotid arteries were occluded. The common carotid was opened with an 11 blade and the arteriotomy was continued with Potts scissors onto the internal carotid artery. A 10 shunt was passed up the internal carotid artery, allowed to back bleed, and then passed down the common carotid artery. The shunt was secured with Rummel tourniquet. The endarterectomy was begun on the common carotid artery  plaque was divided proximally with Potts scissors. The endarterectomy was continued onto the carotid bifurcation. The  external carotid was endarterectomized by eversion technique and the internal carotid artery was endarterectomized in an open fashion. Remaining debris was removed from the endarterectomy plane. A Dacron patch was brought to the field and sewn as a patch angioplasty. Prior to completing the anastomosis, the shunt was removed and the usual flushing maneuvers were undertaken. The anastomosis was then completed and flow was restored first to the external and then the internal carotid artery. Excellent flow characteristics were noted with hand-held Doppler in the internal and external carotid arteries.  The patient was given protamine to reverse the heparin. Hemostasis was obtained with electrocautery. The wounds were irrigated with saline. The wound was closed by first reapproximating the sternocleidomastoid muscle over the carotid artery with interrupted 3-0 Vicryl sutures. Next, the platysma was closed with a running 3-0 Vicryl suture. The skin was closed with a 4-0 subcuticular Vicryl suture. Benzoin and Steri-Strips were applied to the incision. A sterile dressing was placed over the incision. All sponge and needle counts were correct. The patient was awakened in the operating room, neurologically intact. They were transferred to the PACU in stable condition.  Carotid stenosis at surgery: Greater than 90%  Disposition:  To PACU in stable condition,neurologically intact  Relevant Operative Details:  Normal bifurcation location  Rosetta Posner, M.D. [facility] Office: [phone] Pager:  [phone]

## 2015-10-07 NOTE — Anesthesia Procedure Notes (Signed)
Procedure Name: Intubation Date/Time: 10/07/2015 10:35 AM Performed by: Rebekah Chesterfield L Pre-anesthesia Checklist: Patient identified, Emergency Drugs available, Suction available and Patient being monitored Patient Re-evaluated:Patient Re-evaluated prior to inductionOxygen Delivery Method: Circle System Utilized Preoxygenation: Pre-oxygenation with 100% oxygen Intubation Type: IV induction Ventilation: Oral airway inserted - appropriate to patient size and Mask ventilation with difficulty Laryngoscope Size: Mac and 4 Grade View: Grade I Tube type: Oral Number of attempts: 1 Airway Equipment and Method: Stylet,  Oral airway and LTA kit utilized Placement Confirmation: ETT inserted through vocal cords under direct vision,  positive ETCO2 and breath sounds checked- equal and bilateral Secured at: 21 cm Tube secured with: Tape Dental Injury: Teeth and Oropharynx as per pre-operative assessment

## 2015-10-07 NOTE — Interval H&P Note (Signed)
History and Physical Interval Note:  10/07/2015 7:22 AM  Eric Hinton  has presented today for surgery, with the diagnosis of Left Internal Carotid Artery Stenosis I65.22  The various methods of treatment have been discussed with the patient and family. After consideration of risks, benefits and other options for treatment, the patient has consented to  Procedure(s): ENDARTERECTOMY CAROTID (Left) as a surgical intervention .  The patient's history has been reviewed, patient examined, no change in status, stable for surgery.  I have reviewed the patient's chart and labs.  Questions were answered to the patient's satisfaction.     Curt Jews

## 2015-10-07 NOTE — H&P (View-Only) (Signed)
   Patient name: Eric Hinton MRN: [id number] DOB: Mar 06, 1941 Sex: male  REASON FOR VISIT: Follow-up right carotid endarterectomy 08/29/2015  HPI: Eric Hinton is a 75 y.o. male here for follow-up of right carotid endarterectomy. He had moderate to severe right carotid stenosis and multiple visual changes. He does have diminished vision in his right eye but reports that he has had no more of the episodes where he would have a cloudy sensation that would come spontaneously and resolve since his surgery. His had no other neurologic deficits.  Current Outpatient Prescriptions  Medication Sig Dispense Refill  . aspirin 325 MG tablet Take 325 mg by mouth daily.    Marland Kitchen atorvastatin (LIPITOR) 10 MG tablet Take 10 mg by mouth daily.    . Cyanocobalamin (VITAMIN B-12) 2500 MCG SUBL Place 2,500 mcg under the tongue daily.    Marland Kitchen latanoprost (XALATAN) 0.005 % ophthalmic solution Place 1 drop into both eyes at bedtime.    Marland Kitchen menthol-zinc oxide (GOLD BOND) powder Apply 1 application topically daily as needed (for itching).    . naproxen sodium (ANAPROX) 220 MG tablet Take 440 mg by mouth 2 (two) times daily as needed (for pain).    . valsartan (DIOVAN) 320 MG tablet Take 320 mg by mouth daily.    Marland Kitchen oxyCODONE-acetaminophen (ROXICET) 5-325 MG tablet Take 1 tablet by mouth every 6 (six) hours as needed. (Patient not taking: Reported on 09/20/2015) 15 tablet 0   No current facility-administered medications for this visit.       PHYSICAL EXAM: Filed Vitals:   09/20/15 1107  BP: 124/70  Pulse: 101  Temp: 98 F (36.7 C)  TempSrc: Oral  Resp: 18  Height: '5\' 5"'$  (1.651 m)  Weight: 196 lb (88.905 kg)  SpO2: 97%    GENERAL: The patient is a well-nourished male, in no acute distress. The vital signs are documented above. Right neck incision is well-healed. Has no carotid bruits bilaterally. Grossly intact neurologically.  MEDICAL ISSUES: Stable status post right  carotid endarterectomy. He does have known severe asymptomatic carotid stenosis on the left inches been confirmed with CT angiogram. Have recommended elective endarterectomy and he wishes to proceed with this first week after July 4. Will plan surgery at his convenience. Understands the potential risk for surgery. Also explained that he will have significant numbness between the level of the 2 incisions which will improve as these sensory nerve regrowth over time.  Rosetta Posner, MD FACS [facility] Tel [phone] Pager [phone]

## 2015-10-07 NOTE — Anesthesia Postprocedure Evaluation (Signed)
Anesthesia Post Note  Patient: Eric Hinton  Procedure(s) Performed: Procedure(s) (LRB): LEFT CAROTID ARTERY ENDARTERECTOMY (Left) With HEMASHIELD PLATINUM PATCH ANGIOPLASTY (Left)  Patient location during evaluation: PACU Anesthesia Type: General Level of consciousness: awake, awake and alert and oriented Pain management: pain level controlled Vital Signs Assessment: post-procedure vital signs reviewed and stable Respiratory status: spontaneous breathing, nonlabored ventilation and respiratory function stable Cardiovascular status: blood pressure returned to baseline Anesthetic complications: no    Last Vitals:  Filed Vitals:   10/07/15 1500 10/07/15 1521  BP:    Pulse: 75   Temp: 36.1 C 36.6 C  Resp: 18     Last Pain:  Filed Vitals:   10/07/15 1535  PainSc: 2                  Janathan Bribiesca COKER

## 2015-10-07 NOTE — Progress Notes (Signed)
RN attempted in & out cath because patient unable to void, and bladder scan showing 847m. In & out was unsuccessful d/t meeting resistance most likely from prostate (Hx of prostate cancer). RN notified MD to get order for foley or coude. MD CBridgett Larssongave RN verbal order to place whichever can be placed and leave in. If unsuccessful to contact urology.

## 2015-10-07 NOTE — Progress Notes (Signed)
6N RN attempted coude' placement. Placement unsuccessful. Pt. Nurse notified. Urology will be contacted.  Jimmie Molly, RN

## 2015-10-08 LAB — BASIC METABOLIC PANEL
Anion gap: 8 (ref 5–15)
BUN: 9 mg/dL (ref 6–20)
CHLORIDE: 106 mmol/L (ref 101–111)
CO2: 24 mmol/L (ref 22–32)
CREATININE: 0.96 mg/dL (ref 0.61–1.24)
Calcium: 9.1 mg/dL (ref 8.9–10.3)
GFR calc Af Amer: >60 mL/min (ref >60)
GLUCOSE: 123 mg/dL — AB (ref 65–99)
POTASSIUM: 3.6 mmol/L (ref 3.5–5.1)
Sodium: 138 mmol/L (ref 135–145)

## 2015-10-08 LAB — CBC
HCT: 35.1 % — ABNORMAL LOW (ref 39.0–52.0)
Hemoglobin: 12.1 g/dL — ABNORMAL LOW (ref 13.0–17.0)
MCH: 32.1 pg (ref 26.0–34.0)
MCHC: 34.5 g/dL (ref 30.0–36.0)
MCV: 93.1 fL (ref 78.0–100.0)
PLATELETS: 165 10*3/uL (ref 150–400)
RBC: 3.77 MIL/uL — AB (ref 4.22–5.81)
RDW: 12.9 % (ref 11.5–15.5)
WBC: 5.9 10*3/uL (ref 4.0–10.5)

## 2015-10-08 MED ORDER — TAMSULOSIN HCL 0.4 MG PO CAPS: 0.4000 mg | ORAL_CAPSULE | Freq: Every day | ORAL | Status: DC

## 2015-10-08 MED ORDER — FLEET ENEMA 7-19 GM/118ML RE ENEM
1.0000 | ENEMA | Freq: Once | RECTAL | Status: AC
  Administered 2015-10-08: 1 via RECTAL
  Filled 2015-10-08: qty 1

## 2015-10-08 NOTE — Progress Notes (Addendum)
    Progress Note   Subjective  - POD #1  Required urology consult and catheter placement yesterday for acute urinary retention secondary to urethral stricture. Catheter is "burning" this am. Feels like he needs a BM and is "impacted."   Objective Filed Vitals:   10/08/15 0036 10/08/15 0400  BP: 140/74 136/72  Pulse: 91 91  Temp: 97.2 F (36.2 C) 98.4 F (36.9 C)  Resp: 21 16    Intake/Output Summary (Last 24 hours) at 10/08/15 0803 Last data filed at 10/08/15 0651  Gross per 24 hour  Intake   3240 ml  Output   3300 ml  Net    -60 ml    Left neck incision no hematoma.  Tongue midline. Smile symmetric. 5/5 strength upper and lower extremities.   Assessment/Planning: 75 y.o. male is s/p: left carotid endartectomy 1 Day Post-Op   Neuro exam intact. No hematoma. Labile blood pressure: still on dopamine gtt. Wean off.  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 Will keep catheter in at discharge with plans for outpatient follow-up in one week. Will start Flomax today if blood pressure is stable off dopamine.  Constipation: had small BM this am. Will give enema. Mobilize.  Possible d/c later today if able to come off of dopamine.   Eric Hinton 10/08/2015 8:03 AM --  Laboratory CBC    Component Value Date/Time   WBC 5.9 10/08/2015 0439   HGB 12.1* 10/08/2015 0439   HCT 35.1* 10/08/2015 0439   PLT 165 10/08/2015 0439    BMET    Component Value Date/Time   NA 138 10/08/2015 0439   K 3.6 10/08/2015 0439   CL 106 10/08/2015 0439   CO2 24 10/08/2015 0439   GLUCOSE 123* 10/08/2015 0439   BUN 9 10/08/2015 0439   CREATININE 0.96 10/08/2015 0439   CALCIUM 9.1 10/08/2015 0439   GFRNONAA >60 10/08/2015 0439   GFRAA >60 10/08/2015 0439    COAG Lab Results  Component Value Date   INR 0.99 10/05/2015   INR 1.12 08/25/2015   No results found for: PTT  Antibiotics Anti-infectives    Start      Dose/Rate Route Frequency Ordered Stop   10/07/15 2200  cefUROXime (ZINACEF) 1.5 g in dextrose 5 % 50 mL IVPB     1.5 g 100 mL/hr over 30 Minutes Intravenous Every 12 hours 10/07/15 1530 10/08/15 2159   10/07/15 0900  cefUROXime (ZINACEF) 1.5 g in dextrose 5 % 50 mL IVPB     1.5 g 100 mL/hr over 30 Minutes Intravenous To [facility]Stay [facility] 10/07/15 0659 10/07/15 [place], PA-C [facility] Office: [phone] Pager: [phone] 10/08/2015 8:03 AM  Addendum  I have independently interviewed and examined the patient, and I agree with the physician assistant's findings.  Still on a whiff of DA (<renal dose) so should be able to d/c.  If BP ok by noon, ok to D/C with foley in place and leg bag.  Will follow up on Urology arrangements for this patient.  Pt neuro intact this AM.  Eric Barthel, MD [facility]: [phone] Pager: [phone]  10/08/2015, 9:48 AM

## 2015-10-08 NOTE — Procedures (Signed)
Procedure performed: Cystoscopy with insertion of a Foley catheter over a guidewire. Indications: Please see the consult note for details, essentially the patient wasn't acute urinary retention following carotid endarterectomy. He has a history of prostate cancer and failed several attempts by the nursing staff to place a catheter.  Findings: The patient had a irradiated prostate with a posterior urethral stricture. I was able to get an 59 [place] council tip catheter over the wire quite easily and atraumatically. 1 L of urine was returned.  Description of procedure: Informed consent was obtained from the patient. He was in prepped and draped with Betadine and sterile surgical towels. One percent lidocaine jelly was then instilled to the patient's urethra. Flexible cystoscope was inserted and passed to the patient's urethra and into the bulbous urethra where a ischemic posterior urethral stricture was noted have unable to pass of the cystoscope. Subsequently passed a 0.38 sensor wire through the scope without was able to easily advance into the bladder. I then backed the scope out over the wire and subsequently passed an 57 [place] council tip catheter over the wire and was able to atraumatically pass it through the posterior urethral stricture and into the bladder. Cleary earlier and was retained. 10 mL of sterile water was then instilled into the Foley catheter balloon. A leg bag was attached.  Disposition: The patient should be discharged home with a Foley catheter. We'll get him scheduled for a voiding trial in one week. If able, please start Flomax for the patient.

## 2015-10-08 NOTE — Consult Note (Signed)
I have been asked to see the patient by Dr. Adele Barthel, MD for evaluation and management of acute urinary retention  History of present illness: 75 year old male with a history of prostate cancer status post external beam radiation therapy 3 years prior in [place] underwent a carotid endarterectomy today uneventfully. The postoperative period the patient had difficulty urinating. He was able to void small amounts but developed worsening suprapubic pain. A bladder scan was performed with 853 mL noted. Several times by the nursing staff were made with an catheters followed by a coud catheter unsuccessfully. Urology was subsequently consulted for Foley catheter placement.  Baseline, the patient states that he gets up several times during the night to urinate and voids every few hours during the day. He has a weak stream. He has had trouble urinating and with this he had radiation. He is currently not taking any medications for his voiding symptoms. He states he does have a past history of urinary retention and had difficulty voiding and his last carotid endarterectomy on the contralateral side 3 weeks prior.  Review of systems: A 12 point comprehensive review of systems was obtained and is negative unless otherwise stated in the history of present illness.  Patient Active Problem List   Diagnosis Date Noted  . Carotid artery stenosis, asymptomatic 10/07/2015  . Symptomatic carotid artery stenosis 08/29/2015  . H/O Squamous cell lung cancer ([place]) 07/15/2015    No current facility-administered medications on file prior to encounter.   Current Outpatient Prescriptions on File Prior to Encounter  Medication Sig Dispense Refill  . aspirin 325 MG tablet Take 325 mg by mouth daily.    Marland Kitchen atorvastatin (LIPITOR) 10 MG tablet Take 10 mg by mouth daily.    . Cyanocobalamin (VITAMIN B-12) 2500 MCG SUBL Place 2,500 mcg under the tongue daily.    Marland Kitchen latanoprost (XALATAN) 0.005 % ophthalmic solution Place 1 drop  into both eyes at bedtime.    Marland Kitchen menthol-zinc oxide (GOLD BOND) powder Apply 1 application topically daily as needed (for itching).    . naproxen sodium (ANAPROX) 220 MG tablet Take 440 mg by mouth 2 (two) times daily as needed (for pain).    . valsartan (DIOVAN) 320 MG tablet Take 320 mg by mouth daily.      Past Medical History  Diagnosis Date  . Hypertension   . Hyperlipidemia     pt denies  . COPD (chronic obstructive pulmonary disease) ([place])   . Shortness of breath dyspnea     with exertion  . Arthritis     back  . H/O Squamous cell lung cancer ([place]) 07/15/2015  . Skin cancer   . Prostate cancer [facility])     Past Surgical History  Procedure Laterality Date  . Tonsillectomy    . Skin cancer excision Right     neck  . Nose surgery      "rebulit my nose; related to skin cancer"  . Colonoscopy    . Endarterectomy Right 08/29/2015    Procedure: RIGHT CAROTID ENDARTERECTOMY WITH PATCH ANGIOPLASTY;  Surgeon: Rosetta Posner, MD;  Location: [facility];  Service: Vascular;  Laterality: Right;  . Carotid endarterectomy Left 10/07/2015  . Cataract extraction w/ intraocular lens  implant, bilateral Bilateral   . Prostate biopsy      Social History  Substance Use Topics  . Smoking status: Current Every Day Smoker -- 1.50 packs/day for 69 years    Types: Cigarettes  . Smokeless tobacco: Never Used  . Alcohol Use:  21.0 oz/week    0 Standard drinks or equivalent, 35 Cans of beer per week     Comment: 10/07/2015 "4-6 beers/day"    Family History  Problem Relation Age of Onset  . Cancer Mother     cancer  . Stroke Father   . Heart disease Father     PE: Filed Vitals:   10/07/15 1900 10/07/15 1920 10/07/15 2007 10/08/15 0036  BP: 108/53 71/51 105/50 140/74  Pulse: 69 87 78 91  Temp:   98 F (36.7 C) 97.2 F (36.2 C)  TempSrc:   Oral Oral  Resp: '20 18 19 21  '$ Height:      Weight:      SpO2: 93% 94% 93% 96%   Patient appears to be in no acute distress  patient is alert and oriented  x3 Atraumatic normocephalic head No cervical or supraclavicular lymphadenopathy appreciated No increased work of breathing, no audible wheezes/rhonchi Regular sinus rhythm/rate Abdomen is soft, nontender, nondistended, no CVA or suprapubic tenderness Lower extremities are symmetric without appreciable edema Grossly neurologically intact No identifiable skin lesions   Recent Labs  10/05/15 1424  WBC 5.2  HGB 14.3  HCT 43.0    Recent Labs  10/05/15 1424  NA 140  K 4.0  CL 105  CO2 28  GLUCOSE 104*  BUN 12  CREATININE 1.43*  CALCIUM 9.9    Recent Labs  10/05/15 1424  INR 0.99   No results for input(s): LABURIN in the last 72 hours. Results for orders placed or performed during the hospital encounter of 10/05/15  Surgical pcr screen     Status: None   Collection Time: 10/05/15  2:23 PM  Result Value Ref Range Status   MRSA, PCR NEGATIVE NEGATIVE Final   Staphylococcus aureus NEGATIVE NEGATIVE Final    Comment:        The Xpert SA Assay (FDA approved for NASAL specimens in patients over 21 years of age), is one component of a comprehensive surveillance program.  Test performance has been validated by [facility] for patients greater than or equal to 79 year old. It is not intended to diagnose infection nor to guide or monitor treatment.     Imaging: none  Imp: The patient has a posterior urethral stricture secondary to radiation injury. I was able to pass an 17 [place] council tip catheter over wire which I placed cystoscopically. Please see the procedure note for additional details.  Recommendations: A Foley catheter was placed over a wire uneventfully with clear yellow urine returned. I recommended to the patient that he initiate Flomax, but we will defer to the primary team to order given the patient's labile hemodynamics currently. The patient should keep his catheter for the next 7 days.  I will get him scheduled for a voiding trial at the [facility]  office where he can also re-establish himself for his prostate cancer and coronary symptoms.  Please page with any further questions or concerns. Louis Meckel W

## 2015-10-10 ENCOUNTER — Encounter (HOSPITAL_COMMUNITY): Payer: Self-pay | Admitting: Vascular Surgery

## 2015-10-10 ENCOUNTER — Other Ambulatory Visit: Payer: Self-pay | Admitting: *Deleted

## 2015-10-10 ENCOUNTER — Telehealth: Payer: Self-pay | Admitting: Vascular Surgery

## 2015-10-10 DIAGNOSIS — Z960 Presence of urogenital implants: Principal | ICD-10-CM

## 2015-10-10 DIAGNOSIS — Z978 Presence of other specified devices: Secondary | ICD-10-CM

## 2015-10-10 NOTE — Telephone Encounter (Signed)
Mena Goes, RN  P [facility] Admin Pool             Previous Messages     ----- Message -----   From: Alvia Grove, PA-C   Sent: 10/08/2015  9:51 AM    To: [facility] Charge Pool   S/p left CEA 10/07/2015   Patient required urology consult and catheter insertion by Dr. Louis Meckel. Patient says urologist set up appointment in [place] for 1 week. Please confirm that he has appointment set up.   Thank you.   Maudie Mercury

## 2015-10-10 NOTE — Telephone Encounter (Signed)
Sched appt 7/25 at 12:00. Lm on hm# to inform pt of appt and for pt to call back to confirm they have an appt w/ Dr. Louis Meckel.

## 2015-10-10 NOTE — Telephone Encounter (Signed)
-----   Message from Mena Goes, RN sent at 10/07/2015  1:10 PM EDT ----- Regarding: schedule 2 week postop    ----- Message -----    From: Gabriel Earing, PA-C    Sent: 10/07/2015  12:23 PM      To: [facility] Charge Pool  S/p left CEA 10/07/15.  F/u with Dr. Donnetta Hutching in 2 weeks.  Thanks, Aldona Bar

## 2015-10-11 ENCOUNTER — Telehealth: Payer: Self-pay | Admitting: Vascular Surgery

## 2015-10-11 NOTE — Telephone Encounter (Signed)
Spoke to pt's wife; she said pt has an appt w/ Dr. Carlton Adam office 10/18/15 at 8:30.

## 2015-10-12 ENCOUNTER — Telehealth: Payer: Self-pay | Admitting: *Deleted

## 2015-10-12 NOTE — Telephone Encounter (Signed)
Oncology Nurse Navigator Documentation  Oncology Nurse Navigator Flowsheets 10/12/2015  Navigator Encounter Type Telephone  Telephone Outgoing Call  Treatment Phase Other  Acuity Level 2  Acuity Level 2 Other  Time Spent with Patient 30   I followed up on Eric Hinton's referral to [facility].  I contacted Tom PA at [facility].  He stated patient has been set up with them but has been a no show to several appt's.  I called to check on patient to see if needed assistance.

## 2015-10-12 NOTE — Discharge Summary (Signed)
[facility] Discharge Summary  Eric Hinton September 02, 1940 75 y.o. male  [id number]  Admission Date: 10/07/2015  Discharge Date: 10/08/2015  Physician: Curt Jews, MD  Admission Diagnosis: Left Internal Carotid Artery Stenosis I65.22  HPI:   This is a 75 y.o. male who presented for follow-up of right carotid endarterectomy. He had moderate to severe right carotid stenosis and multiple visual changes. He does have diminished vision in his right eye but reports that he has had no more of the episodes where he would have a cloudy sensation that would come spontaneously and resolve since his surgery. He had no other neurologic deficits.  Hospital Course:  The patient was admitted to the hospital and taken to the operating room on 10/07/2015 and underwent left carotid endarterectomy.  The patient tolerated the procedure well and was transported to the PACU in stable condition.  By POD 1, the patient's neuro status was intact. His incision was healing well without hematoma. He required a dopamine gtt for labile blood pressure. This was weaned off on POD 1. 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 Urology was consulted for catheter placement after failed attempts by nursing staff. He was started on Flomax. He was discharged home with a catheter and follow up was arranged for outpatient urology. He was discharged home on POD 1 in good condition.   Discharge Instructions:   The patient is discharged to home with extensive instructions on wound care and progressive ambulation.  They are instructed not to drive or perform any heavy lifting until returning to see the physician in his office.  Discharge Instructions    CAROTID Sugery: Call MD for difficulty swallowing or speaking; weakness in arms or legs that is a new symtom; severe headache.  If you have increased swelling in the neck and/or  are having difficulty breathing, CALL [phone]     Complete by:  As directed      Call MD for:  redness, tenderness, or signs of infection (pain, swelling, bleeding, redness, odor or green/yellow discharge around incision site)    Complete by:  As directed      Call MD for:  severe or increased pain, loss or decreased feeling  in affected limb(s)    Complete by:  As directed      Call MD for:  temperature >100.5    Complete by:  As directed      Discharge wound care:    Complete by:  As directed   Shower daily with soap and water starting 10/09/15     Driving Restrictions    Complete by:  As directed   No driving for 2 weeks     Lifting restrictions    Complete by:  As directed   No lifting for 2 weeks     Resume previous diet    Complete by:  As directed            Discharge Diagnosis:  Left Internal Carotid Artery Stenosis I65.22  Secondary Diagnosis: Patient Active Problem List   Diagnosis Date Noted  . Carotid artery stenosis, asymptomatic 10/07/2015  . Symptomatic carotid artery stenosis 08/29/2015  . H/O Squamous cell lung cancer ([place]) 07/15/2015   Past Medical History  Diagnosis Date  . Hypertension   . Hyperlipidemia     pt denies  . COPD (chronic obstructive pulmonary disease) ([place])   . Shortness of breath dyspnea     with exertion  . Arthritis  back  . H/O Squamous cell lung cancer ([place]) 07/15/2015  . Skin cancer   . Prostate cancer ([place])       Medication List    TAKE these medications        aspirin 325 MG tablet  Take 325 mg by mouth daily.     atorvastatin 10 MG tablet  Commonly known as:  LIPITOR  Take 10 mg by mouth daily.     latanoprost 0.005 % ophthalmic solution  Commonly known as:  XALATAN  Place 1 drop into both eyes at bedtime.     menthol-zinc oxide powder  Apply 1 application topically daily as needed (for itching).     naproxen sodium 220 MG tablet  Commonly known as:  ANAPROX  Take 440 mg by mouth 2 (two) times daily as needed (for pain).     oxyCODONE-acetaminophen  5-325 MG tablet  Commonly known as:  ROXICET  Take 1 tablet by mouth every 6 (six) hours as needed.     tamsulosin 0.4 MG Caps capsule  Commonly known as:  FLOMAX  Take 1 capsule (0.4 mg total) by mouth daily.     valsartan 320 MG tablet  Commonly known as:  DIOVAN  Take 320 mg by mouth daily.     Vitamin B-12 2500 MCG Subl  Place 2,500 mcg under the tongue daily.        Percocet #8 No Refill  Disposition: Home  Patient's condition: is Good  Follow up: 1. Dr. Donnetta Hutching in 2 weeks.   Virgina Jock, PA-C [facility] [phone]  --- For [facility] use --- Instructions: Press F2 to tab through selections.  Delete question if not applicable.   Modified Rankin score at D/C (0-6): 0  IV medication needed for:  1. Hypertension: No 2. Hypotension: Yes  Post-op Complications: No  1. Post-op CVA or TIA: No   2. CN injury: No  3. Myocardial infarction: No  4.  CHF: No  5.  Dysrhythmia (new): No  6. Wound infection: No  7. Reperfusion symptoms: No  8. Return to OR: No  Discharge medications: Statin use:  Yes If No: '[ ]'$  For Medical reasons, '[ ]'$  Non-compliant, '[ ]'$  Not-indicated ASA use:  Yes  If No: '[ ]'$  For Medical reasons, '[ ]'$  Non-compliant, '[ ]'$  Not-indicated Beta blocker use:  No If No: '[ ]'$  For Medical reasons, '[ ]'$  Non-compliant, [x ] Not-indicated ACE-Inhibitor use:  No, on ARB If No: '[ ]'$  For Medical reasons, '[ ]'$  Non-compliant, [x ] Not-indicated P2Y12 Antagonist use: No, '[ ]'$  Plavix, '[ ]'$  Plasugrel, '[ ]'$  Ticlopinine, '[ ]'$  Ticagrelor, '[ ]'$  Other, '[ ]'$  No for medical reason, '[ ]'$  Non-compliant, [ x] Not-indicated Anti-coagulant use:  No, '[ ]'$  Warfarin, '[ ]'$  Rivaroxaban, '[ ]'$  Dabigatran, '[ ]'$  Other, '[ ]'$  No for medical reason, '[ ]'$  Non-compliant, [x ] Not-indicated

## 2015-10-13 ENCOUNTER — Telehealth: Payer: Self-pay | Admitting: *Deleted

## 2015-10-13 ENCOUNTER — Encounter: Payer: Self-pay | Admitting: Vascular Surgery

## 2015-10-17 ENCOUNTER — Encounter: Payer: Self-pay | Admitting: *Deleted

## 2015-10-17 ENCOUNTER — Telehealth: Payer: Self-pay | Admitting: *Deleted

## 2015-10-17 DIAGNOSIS — C3491 Malignant neoplasm of unspecified part of right bronchus or lung: Secondary | ICD-10-CM

## 2015-10-17 NOTE — Telephone Encounter (Signed)
Oncology Nurse Navigator Documentation  Oncology Nurse Navigator Flowsheets 10/17/2015  Navigator Encounter Type Telephone  Telephone Outgoing Call  Treatment Phase Pre-Tx/Tx Discussion  Barriers/Navigation Needs Coordination of Care  Interventions Coordination of Care  Coordination of Care Appts  Acuity Level 2  Acuity Level 2 Other  Time Spent with Patient 30   I received notification from [facility], [place]. PA-C that Dr. Julien Nordmann can see the patient.  I called the patient gave him and his wife the appt with Dr. Julien Nordmann for 11/08/15 arrive at 1:45.

## 2015-10-18 ENCOUNTER — Encounter: Payer: Medicare Other | Admitting: Vascular Surgery

## 2015-10-18 DIAGNOSIS — R338 Other retention of urine: Secondary | ICD-10-CM | POA: Diagnosis not present

## 2015-10-27 ENCOUNTER — Encounter: Payer: Self-pay | Admitting: Vascular Surgery

## 2015-11-01 ENCOUNTER — Telehealth: Payer: Self-pay | Admitting: *Deleted

## 2015-11-01 NOTE — Telephone Encounter (Signed)
Oncology Nurse Navigator Documentation  Oncology Nurse Navigator Flowsheets 11/01/2015  Navigator Encounter Type Telephone  Telephone Outgoing Call  Treatment Phase Pre-Tx/Tx Discussion  Barriers/Navigation Needs Coordination of Care/Dr. Julien Nordmann updated me that he was able to see patient's on 11/03/15.  I called Eric Hinton to update him that Dr. Julien Nordmann could see him at an earlier time.  I gave him an appt to see Dr. Julien Nordmann on 11/03/15 arrive at 1:30 with labs at 1:45 and see Dr. Julien Nordmann at 2:00.  He verbalized understanding of appt time and place.   Interventions Coordination of Care  Coordination of Care Appts  Acuity Level 1  Acuity Level 1 Minimal follow up required  Acuity Level 2 -  Time Spent with Patient 15

## 2015-11-03 ENCOUNTER — Other Ambulatory Visit (HOSPITAL_BASED_OUTPATIENT_CLINIC_OR_DEPARTMENT_OTHER): Payer: Medicare Other

## 2015-11-03 ENCOUNTER — Ambulatory Visit (HOSPITAL_BASED_OUTPATIENT_CLINIC_OR_DEPARTMENT_OTHER): Payer: Medicare Other | Admitting: Internal Medicine

## 2015-11-03 ENCOUNTER — Encounter: Payer: Self-pay | Admitting: Internal Medicine

## 2015-11-03 ENCOUNTER — Telehealth: Payer: Self-pay | Admitting: Internal Medicine

## 2015-11-03 ENCOUNTER — Encounter: Payer: Self-pay | Admitting: Vascular Surgery

## 2015-11-03 ENCOUNTER — Ambulatory Visit (INDEPENDENT_AMBULATORY_CARE_PROVIDER_SITE_OTHER): Payer: Medicare Other | Admitting: Vascular Surgery

## 2015-11-03 VITALS — BP 141/82 | HR 87 | Temp 97.3°F | Resp 16 | Ht 65.0 in | Wt 194.0 lb

## 2015-11-03 VITALS — BP 128/62 | HR 110 | Temp 98.0°F | Resp 18 | Ht 65.0 in | Wt 195.7 lb

## 2015-11-03 DIAGNOSIS — C3491 Malignant neoplasm of unspecified part of right bronchus or lung: Secondary | ICD-10-CM

## 2015-11-03 DIAGNOSIS — Z85118 Personal history of other malignant neoplasm of bronchus and lung: Secondary | ICD-10-CM

## 2015-11-03 DIAGNOSIS — I6523 Occlusion and stenosis of bilateral carotid arteries: Secondary | ICD-10-CM

## 2015-11-03 DIAGNOSIS — Z720 Tobacco use: Secondary | ICD-10-CM

## 2015-11-03 LAB — CBC WITH DIFFERENTIAL/PLATELET
BASO%: 0.9 % (ref 0.0–2.0)
Basophils Absolute: 0 10*3/uL (ref 0.0–0.1)
EOS%: 3.5 % (ref 0.0–7.0)
Eosinophils Absolute: 0.2 10*3/uL (ref 0.0–0.5)
HCT: 41.8 % (ref 38.4–49.9)
HGB: 14.1 g/dL (ref 13.0–17.1)
LYMPH%: 30.4 % (ref 14.0–49.0)
MCH: 31.3 pg (ref 27.2–33.4)
MCHC: 33.7 g/dL (ref 32.0–36.0)
MCV: 92.8 fL (ref 79.3–98.0)
MONO#: 0.3 10*3/uL (ref 0.1–0.9)
MONO%: 6.2 % (ref 0.0–14.0)
NEUT#: 2.8 10*3/uL (ref 1.5–6.5)
NEUT%: 59 % (ref 39.0–75.0)
PLATELETS: 199 10*3/uL (ref 140–400)
RBC: 4.51 10*6/uL (ref 4.20–5.82)
RDW: 13.8 % (ref 11.0–14.6)
WBC: 4.8 10*3/uL (ref 4.0–10.3)
lymph#: 1.5 10*3/uL (ref 0.9–3.3)

## 2015-11-03 LAB — COMPREHENSIVE METABOLIC PANEL
ALT: 20 U/L (ref 0–55)
ANION GAP: 8 meq/L (ref 3–11)
AST: 19 U/L (ref 5–34)
Albumin: 3.7 g/dL (ref 3.5–5.0)
Alkaline Phosphatase: 54 U/L (ref 40–150)
BUN: 17.4 mg/dL (ref 7.0–26.0)
CHLORIDE: 105 meq/L (ref 98–109)
CO2: 25 meq/L (ref 22–29)
CREATININE: 1.4 mg/dL — AB (ref 0.7–1.3)
Calcium: 9.8 mg/dL (ref 8.4–10.4)
EGFR: 51 mL/min/{1.73_m2} — AB (ref >90)
GLUCOSE: 148 mg/dL — AB (ref 70–140)
Potassium: 4.4 mEq/L (ref 3.5–5.1)
SODIUM: 139 meq/L (ref 136–145)
Total Bilirubin: 0.54 mg/dL (ref 0.20–1.20)
Total Protein: 7.3 g/dL (ref 6.4–8.3)

## 2015-11-03 NOTE — Patient Instructions (Signed)
Smoking Cessation, Tips for Success If you are ready to quit smoking, congratulations! You have chosen to help yourself be healthier. Cigarettes bring nicotine, tar, carbon monoxide, and other irritants into your body. Your lungs, heart, and blood vessels will be able to work better without these poisons. There are many different ways to quit smoking. Nicotine gum, nicotine patches, a nicotine inhaler, or nicotine nasal spray can help with physical craving. Hypnosis, support groups, and medicines help break the habit of smoking. WHAT THINGS CAN I DO TO MAKE QUITTING EASIER?  Here are some tips to help you quit for good:  Pick a date when you will quit smoking completely. Tell all of your friends and family about your plan to quit on that date.  Do not try to slowly cut down on the number of cigarettes you are smoking. Pick a quit date and quit smoking completely starting on that day.  Throw away all cigarettes.   Clean and remove all ashtrays from your home, work, and car.  On a card, write down your reasons for quitting. Carry the card with you and read it when you get the urge to smoke.  Cleanse your body of nicotine. Drink enough water and fluids to keep your urine clear or pale yellow. Do this after quitting to flush the nicotine from your body.  Learn to predict your moods. Do not let a bad situation be your excuse to have a cigarette. Some situations in your life might tempt you into wanting a cigarette.  Never have "just one" cigarette. It leads to wanting another and another. Remind yourself of your decision to quit.  Change habits associated with smoking. If you smoked while driving or when feeling stressed, try other activities to replace smoking. Stand up when drinking your coffee. Brush your teeth after eating. Sit in a different chair when you read the paper. Avoid alcohol while trying to quit, and try to drink fewer caffeinated beverages. Alcohol and caffeine may urge you to  smoke.  Avoid foods and drinks that can trigger a desire to smoke, such as sugary or spicy foods and alcohol.  Ask people who smoke not to smoke around you.  Have something planned to do right after eating or having a cup of coffee. For example, plan to take a walk or exercise.  Try a relaxation exercise to calm you down and decrease your stress. Remember, you may be tense and nervous for the first 2 weeks after you quit, but this will pass.  Find new activities to keep your hands busy. Play with a pen, coin, or rubber band. Doodle or draw things on paper.  Brush your teeth right after eating. This will help cut down on the craving for the taste of tobacco after meals. You can also try mouthwash.   Use oral substitutes in place of cigarettes. Try using lemon drops, carrots, cinnamon sticks, or chewing gum. Keep them handy so they are available when you have the urge to smoke.  When you have the urge to smoke, try deep breathing.  Designate your home as a nonsmoking area.  If you are a heavy smoker, ask your health care provider about a prescription for nicotine chewing gum. It can ease your withdrawal from nicotine.  Reward yourself. Set aside the cigarette money you save and buy yourself something nice.  Look for support from others. Join a support group or smoking cessation program. Ask someone at home or at work to help you with your plan   to quit smoking.  Always ask yourself, "Do I need this cigarette or is this just a reflex?" Tell yourself, "Today, I choose not to smoke," or "I do not want to smoke." You are reminding yourself of your decision to quit.  Do not replace cigarette smoking with electronic cigarettes (commonly called e-cigarettes). The safety of e-cigarettes is unknown, and some may contain harmful chemicals.  If you relapse, do not give up! Plan ahead and think about what you will do the next time you get the urge to smoke. HOW WILL I FEEL WHEN I QUIT SMOKING? You  may have symptoms of withdrawal because your body is used to nicotine (the addictive substance in cigarettes). You may crave cigarettes, be irritable, feel very hungry, cough often, get headaches, or have difficulty concentrating. The withdrawal symptoms are only temporary. They are strongest when you first quit but will go away within 10-14 days. When withdrawal symptoms occur, stay in control. Think about your reasons for quitting. Remind yourself that these are signs that your body is healing and getting used to being without cigarettes. Remember that withdrawal symptoms are easier to treat than the major diseases that smoking can cause.  Even after the withdrawal is over, expect periodic urges to smoke. However, these cravings are generally short lived and will go away whether you smoke or not. Do not smoke! WHAT RESOURCES ARE AVAILABLE TO HELP ME QUIT SMOKING? Your health care provider can direct you to community resources or hospitals for support, which may include:  Group support.  Education.  Hypnosis.  Therapy.   This information is not intended to replace advice given to you by your health care provider. Make sure you discuss any questions you have with your health care provider.   Document Released: 12/09/2003 Document Revised: 04/02/2014 Document Reviewed: 08/28/2012 Elsevier Interactive Patient Education 2016 Else[employer] Inc.  

## 2015-11-03 NOTE — Telephone Encounter (Signed)
per pof to sch pt appt-gave pt copy of avs °

## 2015-11-03 NOTE — Progress Notes (Signed)
[place] Telephone:([phone]) [phone]   [phone]  CONSULT NOTE  REFERRING PHYSICIAN: Dr. Wende Neighbors  REASON FOR CONSULTATION:  75 years old white male with history of lung cancer.  HPI Eric Hinton is a 75 y.o. male with past medical history significant for carotid artery stenosis, hypertension, COPD, arthritis as well as history of prostate cancer in November 2011. The patient used to live in [place] and he mentioned that he was undergoing routine imaging studies for evaluation of his prostate cancer when CT scan of the chest showed a lesion in the right middle lobe in addition to multiple enlarging hypermetabolic mediastinal lymph nodes. He underwent bronchoscopy revealing chronic bronchitis with multiple mucous plugs but needle aspiration of the mediastinal lymph nodes on 02/08/2012 showed poorly differentiated squamous cell carcinoma. MRI of the brain at that time was negative for malignancy and the patient was clinically staged as a stage IIIA. He underwent a course of concurrent chemoradiation with weekly carboplatin and paclitaxel. He was unable to complete the last 2 cycles of concurrent chemotherapy because of significant weakness and complications including thrombocytopenia and hospitalization. He completed the course of radiotherapy as scheduled. He refused consolidation chemotherapy. The patient was followed by regular imaging studies and repeat CT scan of the chest on 05/19/2015 showed mild interval increase in the prominence of the chronic irregular density in the anterior right mid lung most compatible with areas of scarring and atelectasis. There was also mildly prominent right hilar lymph nodes. The patient was seen at [facility] by Dr. Whitney Muse and repeat CT scan of the chest on 08/17/2015 showed areas of architectural distortion, bronchiectasis and consolidation in the right upper lobe and right middle lobe probably treatment related. There is  a small right lower lobe pulmonary nodules not seen on previous examination. The patient has some concerns and he preferred to have his care transferred to [facility] and he came today for evaluation and discussion of his condition. When seen today he is feeling fine except for shortness of breath with exertion and mild cough. He denied having any significant chest pain or hemoptysis. He has no significant weight loss or night sweats. He has no headache or visual changes. 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 Family history significant for mother with the stoma cancer and died at age 7 and father died from a stroke at age 68. The patient is married and has 2 sons. He was accompanied today by his wife Eric Hinton. He will come in several jobs and was and the [place] for more than 20 years. He also worked as Nurse, mental health. He has a history of smoking up to 3 packs per day for around 69 years. He started at age 75 and unfortunately he continues to smoke and unable to quit. He also drinks sixpacks of beer every day but no history of drug abuse.  HPI  Past Medical History:  Diagnosis Date  . Arthritis    back  . COPD (chronic obstructive pulmonary disease) ([place])   . H/O Squamous cell lung cancer ([place]) 07/15/2015  . Hyperlipidemia    pt denies  . Hypertension   . Prostate cancer ([place])   . Shortness of breath dyspnea    with exertion  . Skin cancer     Past Surgical History:  Procedure Laterality Date  . CAROTID ENDARTERECTOMY Left 10/07/2015  . CATARACT EXTRACTION W/ INTRAOCULAR LENS  IMPLANT, BILATERAL Bilateral   . COLONOSCOPY    .  ENDARTERECTOMY Right 08/29/2015   Procedure: RIGHT CAROTID ENDARTERECTOMY WITH PATCH ANGIOPLASTY;  Surgeon: Rosetta Posner, MD;  Location: [place];  Service: Vascular;  Laterality: Right;  . ENDARTERECTOMY Left 10/07/2015   Procedure: LEFT CAROTID ARTERY ENDARTERECTOMY;  Surgeon: Rosetta Posner, MD;  Location: [place];  Service: Vascular;  Laterality: Left;    . NOSE SURGERY     "rebulit my nose; related to skin cancer"  . PATCH ANGIOPLASTY Left 10/07/2015   Procedure: With [place];  Surgeon: Rosetta Posner, MD;  Location: [place];  Service: Vascular;  Laterality: Left;  . PROSTATE BIOPSY    . SKIN CANCER EXCISION Right    neck  . TONSILLECTOMY      Family History  Problem Relation Age of Onset  . Cancer Mother     cancer  . Stroke Father   . Heart disease Father     Social History Social History  Substance Use Topics  . Smoking status: Current Every Day Smoker    Packs/day: 1.50    Years: 69.00    Types: Cigarettes  . Smokeless tobacco: Never Used  . Alcohol use 21.0 oz/week    35 Cans of beer per week     Comment: 10/07/2015 "4-6 beers/day"    Allergies  Allergen Reactions  . Wellbutrin [Bupropion] Hives    Current Outpatient Prescriptions  Medication Sig Dispense Refill  . aspirin 325 MG tablet Take 325 mg by mouth daily.    Marland Kitchen atorvastatin (LIPITOR) 10 MG tablet Take 10 mg by mouth daily.    . Cyanocobalamin (VITAMIN B-12) 2500 MCG SUBL Place 2,500 mcg under the tongue daily.    Marland Kitchen latanoprost (XALATAN) 0.005 % ophthalmic solution Place 1 drop into both eyes at bedtime.    Marland Kitchen menthol-zinc oxide (GOLD BOND) powder Apply 1 application topically daily as needed (for itching).    . naproxen sodium (ANAPROX) 220 MG tablet Take 440 mg by mouth 2 (two) times daily as needed (for pain).    . tamsulosin (FLOMAX) 0.4 MG CAPS capsule Take 1 capsule (0.4 mg total) by mouth daily. 30 capsule 0  . valsartan (DIOVAN) 320 MG tablet Take 320 mg by mouth daily.    Marland Kitchen oxyCODONE-acetaminophen (ROXICET) 5-325 MG tablet Take 1 tablet by mouth every 6 (six) hours as needed. (Patient not taking: Reported on 11/03/2015) 6 tablet 0   No current facility-administered medications for this visit.     Review of Systems  Constitutional: negative Eyes: negative Ears, nose, mouth, throat, and face: negative Respiratory:  positive for cough and dyspnea on exertion Cardiovascular: negative Gastrointestinal: negative Genitourinary:negative Integument/breast: negative Hematologic/lymphatic: negative Musculoskeletal:negative Neurological: negative Behavioral/Psych: negative Endocrine: negative Allergic/Immunologic: negative  Physical Exam  [id number], healthy, no distress, well nourished and well developed SKIN: skin color, texture, turgor are normal, no rashes or significant lesions HEAD: Normocephalic, No masses, lesions, tenderness or abnormalities EYES: normal, PERRLA, Conjunctiva are pink and non-injected EARS: External ears normal, Canals clear OROPHARYNX:no exudate, no erythema and lips, buccal mucosa, and tongue normal  NECK: supple, no adenopathy, no JVD LYMPH:  no palpable lymphadenopathy, no hepatosplenomegaly LUNGS: expiratory wheezes bilaterally HEART: regular rate & rhythm, no murmurs and no gallops ABDOMEN:abdomen soft, non-tender, normal bowel sounds and no masses or organomegaly BACK: Back symmetric, no curvature., No CVA tenderness EXTREMITIES:no joint deformities, effusion, or inflammation, no edema, no skin discoloration  NEURO: alert & oriented x 3 with fluent speech, no focal motor/sensory deficits  PERFORMANCE STATUS: ECOG 1  LABORATORY  DATA: Lab Results  Component Value Date   WBC 4.8 11/03/2015   HGB 14.1 11/03/2015   HCT 41.8 11/03/2015   MCV 92.8 11/03/2015   PLT 199 11/03/2015      Chemistry      Component Value Date/Time   NA 139 11/03/2015 1327   K 4.4 11/03/2015 1327   CL 106 10/08/2015 0439   CO2 25 11/03/2015 1327   BUN 17.4 11/03/2015 1327   CREATININE 1.4 (H) 11/03/2015 1327      Component Value Date/Time   CALCIUM 9.8 11/03/2015 1327   ALKPHOS 54 11/03/2015 1327   AST 19 11/03/2015 1327   ALT 20 11/03/2015 1327   BILITOT 0.54 11/03/2015 1327       RADIOGRAPHIC STUDIES: No results found.  ASSESSMENT:This is a very pleasant 75 years old white  male with clinically stage IIIA (T1a, N2, M0) non-small cell lung cancer, poorly differentiated squamous cell carcinoma diagnosed in November 2013 status post course of concurrent chemoradiation with weekly carboplatin and paclitaxel when the patient was in [place]. He has been on observation since that time.   PLAN: I had a lengthy discussion with the patient and his wife today about his current disease status and treatment options. I showed them the images of the previous CT scan of the chest performed in February 2017 as well as May 2017. There was no clear evidence of disease progression during this period. The patient has slightly prominent right hilar adenopathy as well as few subcentimeter nodules in the right lung. I recommended for the patient to continue on observation with repeat CT scan of the chest in 3 months for reevaluation of his disease. If there is any concerning finding, I may consider the patient for a PET scan and biopsy if needed. For smoke cessation I strongly encouraged the patient to quit smoking and offered him a smoke cessation program. The patient was advised to call immediately if he has any concerning symptoms in the interval. The patient voices understanding of current disease status and treatment options and is in agreement with the current care plan.  All questions were answered. The patient knows to call the clinic with any problems, questions or concerns. We can certainly see the patient much sooner if necessary.  Thank you so much for allowing me to participate in the care of [employer]. I will continue to follow up the patient with you and assist in his care.  I spent 40 minutes counseling the patient face to face. The total time spent in the appointment was 60 minutes.  Disclaimer: This note was dictated with voice recognition software. Similar sounding words can inadvertently be transcribed and may not be corrected upon review.   Nanami Whitelaw  K. November 03, 2015, 3:03 PM

## 2015-11-03 NOTE — Progress Notes (Signed)
   Patient name: Eric Hinton MRN: [id number] DOB: 01-26-1941 Sex: male  REASON FOR VISIT: Follow-up of bilateral carotid endarterectomy  HPI: JAHAZIEL Hinton is a 75 y.o. male here today for all of staged bilateral carotid endarterectomy. Most recent left endarterectomy was 10/07/2015 irrigated well and was discharged home. He did have urinary retention and did have a Foley catheter for approximately one half weeks. He had no difficulty with a voiding trial and this is been removed. He has had no neurologic deficit. He continues to have difficulty with right eye vision changes.  Current Outpatient Prescriptions  Medication Sig Dispense Refill  . aspirin 325 MG tablet Take 325 mg by mouth daily.    Marland Kitchen atorvastatin (LIPITOR) 10 MG tablet Take 10 mg by mouth daily.    . Cyanocobalamin (VITAMIN B-12) 2500 MCG SUBL Place 2,500 mcg under the tongue daily.    Marland Kitchen latanoprost (XALATAN) 0.005 % ophthalmic solution Place 1 drop into both eyes at bedtime.    Marland Kitchen menthol-zinc oxide (GOLD BOND) powder Apply 1 application topically daily as needed (for itching).    . naproxen sodium (ANAPROX) 220 MG tablet Take 440 mg by mouth 2 (two) times daily as needed (for pain).    . tamsulosin (FLOMAX) 0.4 MG CAPS capsule Take 1 capsule (0.4 mg total) by mouth daily. 30 capsule 0  . valsartan (DIOVAN) 320 MG tablet Take 320 mg by mouth daily.    Marland Kitchen oxyCODONE-acetaminophen (ROXICET) 5-325 MG tablet Take 1 tablet by mouth every 6 (six) hours as needed. (Patient not taking: Reported on 11/03/2015) 6 tablet 0   No current facility-administered medications for this visit.      PHYSICAL EXAM: Vitals:   11/03/15 1011 11/03/15 1013  BP: 138/81 (!) 141/82  Pulse: 91 87  Resp: 16   Temp: 97.3 F (36.3 C)   SpO2: 98%   Weight: 194 lb (88 kg)   Height: '5\' 5"'$  (1.651 m)     GENERAL: The patient is a well-nourished male, in no acute distress. The vital signs are documented above. Both  carotid incisions are healed quite nicely with no evidence of bruits bilaterally  MEDICAL ISSUES: Stable status post staged bilateral carotid endarterectomies. Will resume full activity with no limitation. We will see him again in 6 months with repeat carotid duplex.   Rosetta Posner, MD FACS [facility] Tel [phone] Pager [phone]

## 2015-11-05 ENCOUNTER — Other Ambulatory Visit: Payer: Self-pay | Admitting: Vascular Surgery

## 2015-11-08 ENCOUNTER — Other Ambulatory Visit: Payer: Medicare Other

## 2015-11-08 ENCOUNTER — Ambulatory Visit: Payer: Medicare Other | Admitting: Internal Medicine

## 2015-11-10 DIAGNOSIS — Z8546 Personal history of malignant neoplasm of prostate: Secondary | ICD-10-CM | POA: Diagnosis not present

## 2015-12-02 NOTE — Addendum Note (Signed)
Addended by: Kaleen Mask on: 12/02/2015 02:00 PM   Modules accepted: Orders

## 2015-12-04 ENCOUNTER — Other Ambulatory Visit: Payer: Self-pay | Admitting: Vascular Surgery

## 2015-12-06 DIAGNOSIS — E782 Mixed hyperlipidemia: Secondary | ICD-10-CM | POA: Diagnosis not present

## 2015-12-06 DIAGNOSIS — R7301 Impaired fasting glucose: Secondary | ICD-10-CM | POA: Diagnosis not present

## 2015-12-07 DIAGNOSIS — H353230 Exudative age-related macular degeneration, bilateral, stage unspecified: Secondary | ICD-10-CM | POA: Diagnosis not present

## 2015-12-07 DIAGNOSIS — Z961 Presence of intraocular lens: Secondary | ICD-10-CM | POA: Diagnosis not present

## 2015-12-09 DIAGNOSIS — C61 Malignant neoplasm of prostate: Secondary | ICD-10-CM | POA: Diagnosis not present

## 2015-12-09 DIAGNOSIS — D126 Benign neoplasm of colon, unspecified: Secondary | ICD-10-CM | POA: Diagnosis not present

## 2015-12-09 DIAGNOSIS — Z720 Tobacco use: Secondary | ICD-10-CM | POA: Diagnosis not present

## 2015-12-09 DIAGNOSIS — Z0001 Encounter for general adult medical examination with abnormal findings: Secondary | ICD-10-CM | POA: Diagnosis not present

## 2015-12-09 DIAGNOSIS — R7301 Impaired fasting glucose: Secondary | ICD-10-CM | POA: Diagnosis not present

## 2015-12-09 DIAGNOSIS — I10 Essential (primary) hypertension: Secondary | ICD-10-CM | POA: Diagnosis not present

## 2015-12-09 DIAGNOSIS — Z85828 Personal history of other malignant neoplasm of skin: Secondary | ICD-10-CM | POA: Diagnosis not present

## 2015-12-09 DIAGNOSIS — R944 Abnormal results of kidney function studies: Secondary | ICD-10-CM | POA: Diagnosis not present

## 2015-12-09 DIAGNOSIS — H3530 Unspecified macular degeneration: Secondary | ICD-10-CM | POA: Diagnosis not present

## 2015-12-09 DIAGNOSIS — Z23 Encounter for immunization: Secondary | ICD-10-CM | POA: Diagnosis not present

## 2015-12-09 DIAGNOSIS — D0220 Carcinoma in situ of unspecified bronchus and lung: Secondary | ICD-10-CM | POA: Diagnosis not present

## 2015-12-20 DIAGNOSIS — H35033 Hypertensive retinopathy, bilateral: Secondary | ICD-10-CM | POA: Diagnosis not present

## 2015-12-20 DIAGNOSIS — H353231 Exudative age-related macular degeneration, bilateral, with active choroidal neovascularization: Secondary | ICD-10-CM | POA: Insufficient documentation

## 2015-12-20 DIAGNOSIS — Z961 Presence of intraocular lens: Secondary | ICD-10-CM | POA: Diagnosis not present

## 2016-01-17 DIAGNOSIS — H353231 Exudative age-related macular degeneration, bilateral, with active choroidal neovascularization: Secondary | ICD-10-CM | POA: Diagnosis not present

## 2016-01-17 DIAGNOSIS — H353223 Exudative age-related macular degeneration, left eye, with inactive scar: Secondary | ICD-10-CM | POA: Diagnosis not present

## 2016-01-17 DIAGNOSIS — H353213 Exudative age-related macular degeneration, right eye, with inactive scar: Secondary | ICD-10-CM | POA: Diagnosis not present

## 2016-02-01 ENCOUNTER — Encounter (HOSPITAL_COMMUNITY): Payer: Self-pay

## 2016-02-01 ENCOUNTER — Other Ambulatory Visit (HOSPITAL_BASED_OUTPATIENT_CLINIC_OR_DEPARTMENT_OTHER): Payer: Medicare Other

## 2016-02-01 ENCOUNTER — Ambulatory Visit (HOSPITAL_COMMUNITY)
Admission: RE | Admit: 2016-02-01 | Discharge: 2016-02-01 | Disposition: A | Discharge status: Home / Self Care | Payer: Medicare Other | Source: Ambulatory Visit | Attending: Internal Medicine | Admitting: Internal Medicine

## 2016-02-01 DIAGNOSIS — Z716 Tobacco abuse counseling: Secondary | ICD-10-CM | POA: Insufficient documentation

## 2016-02-01 DIAGNOSIS — Z9889 Other specified postprocedural states: Secondary | ICD-10-CM | POA: Diagnosis not present

## 2016-02-01 DIAGNOSIS — I700 Atherosclerosis of aorta: Secondary | ICD-10-CM | POA: Diagnosis not present

## 2016-02-01 DIAGNOSIS — J439 Emphysema, unspecified: Secondary | ICD-10-CM | POA: Diagnosis not present

## 2016-02-01 DIAGNOSIS — F17200 Nicotine dependence, unspecified, uncomplicated: Secondary | ICD-10-CM | POA: Insufficient documentation

## 2016-02-01 DIAGNOSIS — Z85118 Personal history of other malignant neoplasm of bronchus and lung: Secondary | ICD-10-CM | POA: Diagnosis present

## 2016-02-01 DIAGNOSIS — C3491 Malignant neoplasm of unspecified part of right bronchus or lung: Secondary | ICD-10-CM

## 2016-02-01 DIAGNOSIS — I2510 Atherosclerotic heart disease of native coronary artery without angina pectoris: Secondary | ICD-10-CM | POA: Diagnosis not present

## 2016-02-01 DIAGNOSIS — K7689 Other specified diseases of liver: Secondary | ICD-10-CM | POA: Diagnosis not present

## 2016-02-01 DIAGNOSIS — R918 Other nonspecific abnormal finding of lung field: Secondary | ICD-10-CM | POA: Diagnosis not present

## 2016-02-01 LAB — COMPREHENSIVE METABOLIC PANEL
ALT: 18 U/L (ref 0–55)
ANION GAP: 10 meq/L (ref 3–11)
AST: 13 U/L (ref 5–34)
Albumin: 3.7 g/dL (ref 3.5–5.0)
Alkaline Phosphatase: 58 U/L (ref 40–150)
BUN: 16.5 mg/dL (ref 7.0–26.0)
CALCIUM: 9.8 mg/dL (ref 8.4–10.4)
CHLORIDE: 106 meq/L (ref 98–109)
CO2: 23 mEq/L (ref 22–29)
Creatinine: 1.2 mg/dL (ref 0.7–1.3)
EGFR: 57 mL/min/{1.73_m2} — AB (ref >90)
Glucose: 108 mg/dl (ref 70–140)
POTASSIUM: 4.2 meq/L (ref 3.5–5.1)
Sodium: 139 mEq/L (ref 136–145)
Total Bilirubin: 0.77 mg/dL (ref 0.20–1.20)
Total Protein: 7.4 g/dL (ref 6.4–8.3)

## 2016-02-01 LAB — CBC WITH DIFFERENTIAL/PLATELET
BASO%: 0.9 % (ref 0.0–2.0)
Basophils Absolute: 0 10*3/uL (ref 0.0–0.1)
EOS ABS: 0.1 10*3/uL (ref 0.0–0.5)
EOS%: 2.6 % (ref 0.0–7.0)
HEMATOCRIT: 44.1 % (ref 38.4–49.9)
HEMOGLOBIN: 15.1 g/dL (ref 13.0–17.1)
LYMPH#: 1.5 10*3/uL (ref 0.9–3.3)
LYMPH%: 29.8 % (ref 14.0–49.0)
MCH: 32.4 pg (ref 27.2–33.4)
MCHC: 34.2 g/dL (ref 32.0–36.0)
MCV: 94.7 fL (ref 79.3–98.0)
MONO#: 0.4 10*3/uL (ref 0.1–0.9)
MONO%: 8.5 % (ref 0.0–14.0)
NEUT%: 58.2 % (ref 39.0–75.0)
NEUTROS ABS: 2.9 10*3/uL (ref 1.5–6.5)
PLATELETS: 182 10*3/uL (ref 140–400)
RBC: 4.65 10*6/uL (ref 4.20–5.82)
RDW: 12.8 % (ref 11.0–14.6)
WBC: 4.9 10*3/uL (ref 4.0–10.3)

## 2016-02-01 MED ORDER — IOPAMIDOL (ISOVUE-300) INJECTION 61%
75.0000 mL | Freq: Once | INTRAVENOUS | Status: AC | PRN
  Administered 2016-02-01: 75 mL via INTRAVENOUS

## 2016-02-08 ENCOUNTER — Ambulatory Visit (HOSPITAL_BASED_OUTPATIENT_CLINIC_OR_DEPARTMENT_OTHER): Payer: Medicare Other | Admitting: Internal Medicine

## 2016-02-08 ENCOUNTER — Telehealth: Payer: Self-pay | Admitting: Internal Medicine

## 2016-02-08 ENCOUNTER — Encounter: Payer: Self-pay | Admitting: Internal Medicine

## 2016-02-08 DIAGNOSIS — Z85118 Personal history of other malignant neoplasm of bronchus and lung: Secondary | ICD-10-CM | POA: Diagnosis not present

## 2016-02-08 DIAGNOSIS — C3491 Malignant neoplasm of unspecified part of right bronchus or lung: Secondary | ICD-10-CM

## 2016-02-08 NOTE — Progress Notes (Signed)
[place] [phone]   Fax:([phone]) [place], MD [place] [address]  DIAGNOSIS: Clinically stage IIIA (T1a, N2, M0) non-small cell lung cancer, poorly differentiated squamous cell carcinoma diagnosed in November 2013   PRIOR THERAPY: status post course of concurrent chemoradiation with weekly carboplatin and paclitaxel when the patient was in [place].   CURRENT THERAPY: Observation.  INTERVAL HISTORY: Eric Hinton 75 y.o. male returns to the clinic today for three-month follow-up visit accompanied by his wife. The patient is feeling fine today with no specific complaints except for shortness of breath with exertion. He denied having any significant chest pain, cough or hemoptysis. He denied having any weight loss or night sweats. The patient denied having any fever or chills, no nausea or vomiting. He was treated for stage III a non-small cell lung cancer in [place] in 2014 and has been on observation since that time. He had repeat CT scan of the chest performed recently and he is here for evaluation and discussion of his scan results. He is followed by Dr. Donnetta Hutching for his peripheral vascular disease.  MEDICAL HISTORY: Past Medical History:  Diagnosis Date  . Arthritis    back  . COPD (chronic obstructive pulmonary disease) ([place])   . H/O Squamous cell lung cancer ([place]) 07/15/2015  . Hyperlipidemia    pt denies  . Hypertension   . Prostate cancer ([place])   . Shortness of breath dyspnea    with exertion  . Skin cancer     ALLERGIES:  is allergic to wellbutrin [bupropion].  MEDICATIONS:  Current Outpatient Prescriptions  Medication Sig Dispense Refill  . aspirin 325 MG tablet Take 325 mg by mouth daily.    Marland Kitchen atorvastatin (LIPITOR) 10 MG tablet Take 10 mg by mouth daily.    . Cyanocobalamin (VITAMIN B-12) 2500 MCG SUBL Place 2,500 mcg under the tongue daily.    Marland Kitchen latanoprost (XALATAN) 0.005 %  ophthalmic solution Place 1 drop into both eyes at bedtime.    Marland Kitchen menthol-zinc oxide (GOLD BOND) powder Apply 1 application topically daily as needed (for itching).    . naproxen sodium (ANAPROX) 220 MG tablet Take 440 mg by mouth 2 (two) times daily as needed (for pain).    Marland Kitchen oxyCODONE-acetaminophen (ROXICET) 5-325 MG tablet Take 1 tablet by mouth every 6 (six) hours as needed. (Patient not taking: Reported on 11/03/2015) 6 tablet 0  . tamsulosin (FLOMAX) 0.4 MG CAPS capsule TAKE 1 CAPSULE BY MOUTH DAILY 30 capsule 0  . valsartan (DIOVAN) 320 MG tablet Take 320 mg by mouth daily.     No current facility-administered medications for this visit.     SURGICAL HISTORY:  Past Surgical History:  Procedure Laterality Date  . CAROTID ENDARTERECTOMY Left 10/07/2015  . CATARACT EXTRACTION W/ INTRAOCULAR LENS  IMPLANT, BILATERAL Bilateral   . COLONOSCOPY    . ENDARTERECTOMY Right 08/29/2015   Procedure: RIGHT CAROTID ENDARTERECTOMY WITH PATCH ANGIOPLASTY;  Surgeon: Rosetta Posner, MD;  Location: [facility];  Service: Vascular;  Laterality: Right;  . ENDARTERECTOMY Left 10/07/2015   Procedure: LEFT CAROTID ARTERY ENDARTERECTOMY;  Surgeon: Rosetta Posner, MD;  Location: [facility];  Service: Vascular;  Laterality: Left;  . NOSE SURGERY     "rebulit my nose; related to skin cancer"  . PATCH ANGIOPLASTY Left 10/07/2015   Procedure: With [place];  Surgeon: Rosetta Posner, MD;  Location: [facility];  Service: Vascular;  Laterality: Left;  . PROSTATE BIOPSY    . SKIN CANCER EXCISION Right    neck  . TONSILLECTOMY      REVIEW OF SYSTEMS:  A comprehensive review of systems was negative except for: Respiratory: positive for dyspnea on exertion   PHYSICAL EXAMINATION: General appearance: alert, cooperative and no distress Head: Normocephalic, without obvious abnormality, atraumatic Neck: no adenopathy, no JVD, supple, symmetrical, trachea midline and thyroid not enlarged, symmetric, no  tenderness/mass/nodules Lymph nodes: Cervical, supraclavicular, and axillary nodes normal. Resp: clear to auscultation bilaterally Back: symmetric, no curvature. ROM normal. No CVA tenderness. Cardio: regular rate and rhythm, S1, S2 normal, no murmur, click, rub or gallop GI: soft, non-tender; bowel sounds normal; no masses,  no organomegaly Extremities: extremities normal, atraumatic, no cyanosis or edema  ECOG PERFORMANCE STATUS: 1 - Symptomatic but completely ambulatory  There were no vitals taken for this visit.  LABORATORY DATA: Lab Results  Component Value Date   WBC 4.9 02/01/2016   HGB 15.1 02/01/2016   HCT 44.1 02/01/2016   MCV 94.7 02/01/2016   PLT 182 02/01/2016      Chemistry      Component Value Date/Time   NA 139 02/01/2016 0945   K 4.2 02/01/2016 0945   CL 106 10/08/2015 0439   CO2 23 02/01/2016 0945   BUN 16.5 02/01/2016 0945   CREATININE 1.2 02/01/2016 0945      Component Value Date/Time   CALCIUM 9.8 02/01/2016 0945   ALKPHOS 58 02/01/2016 0945   AST 13 02/01/2016 0945   ALT 18 02/01/2016 0945   BILITOT 0.77 02/01/2016 0945       RADIOGRAPHIC STUDIES: Ct Chest W Contrast  Result Date: 02/01/2016 CLINICAL DATA:  75 year old male with history of right-sided lung cancer. Chemotherapy completed in 2014. EXAM: CT CHEST WITH CONTRAST TECHNIQUE: Multidetector CT imaging of the chest was performed during intravenous contrast administration. CONTRAST:  11m ISOVUE-300 IOPAMIDOL (ISOVUE-300) INJECTION 61% COMPARISON:  Chest CT 08/17/2015. FINDINGS: Cardiovascular: Heart size is normal. There is no significant pericardial fluid, thickening or pericardial calcification. There is aortic atherosclerosis, as well as atherosclerosis of the great vessels of the mediastinum and the coronary arteries, including calcified atherosclerotic plaque in the left anterior descending, left circumflex and right coronary arteries. Thickening and calcification of the posterior leaflet  of the mitral valve and the mitral annulus. Thickening calcification of the aortic valve. Mediastinum/Nodes: Borderline enlarged right hilar lymph nodes measuring up to 9 mm in short axis. No definite mediastinal or left hilar lymphadenopathy. Esophagus is unremarkable in appearance. No axillary lymphadenopathy. Lungs/Pleura: There are 2 areas of architectural distortion in the anterior aspect of the right upper lobe, one near the level of the aortic arch, and the other more inferiorly immediately above the minor fissure. Both of these appear very similar to prior study from 08/17/2015, presumably areas of chronic post treatment related scarring, and both continued to demonstrates some internal areas of mild cylindrical bronchiectasis. There are no other large suspicious appearing pulmonary nodules or masses. There continues to be innumerable small centrilobular ground-glass attenuation micronodules scattered throughout the lungs bilaterally which are likely reflective of a smoking related disease. Mild diffuse bronchial wall thickening with mild centrilobular and paraseptal emphysema. No acute consolidative airspace disease. No pleural effusions. Upper Abdomen: Multiple well-defined low-attenuation lesions are again noted in the liver, largest of which measures up to 2.6 cm in diameter in segment 4A, compatible with multiple simple cysts. Aortic atherosclerosis. Musculoskeletal: There are no aggressive appearing  lytic or blastic lesions noted in the visualized portions of the skeleton. IMPRESSION: 1. Post treatment related changes in the right upper lobe are very similar to prior examination from 08/17/2015, without definitive findings to suggest local recurrence of disease or definite metastatic disease in the thorax. 2. Diffuse centrilobular ground-glass attenuation micro nodularity is again noted, suggesting a smoking related disease such as respiratory bronchiolitis (RB), or if the patient is symptomatic,  respiratory bronchiolitis interstitial lung disease (RB-ILD). Counseling for smoking cessation is strongly recommended. 3. Mild diffuse bronchial wall thickening with mild centrilobular and paraseptal emphysema; imaging findings suggestive of underlying COPD. 4. Aortic atherosclerosis, in addition to 3 vessel coronary artery disease. Assessment for potential risk factor modification, dietary therapy or pharmacologic therapy may be warranted, if clinically indicated. 5. There are calcifications of the aortic valve and mitral valve/annulus. Echocardiographic correlation for evaluation of potential valvular dysfunction may be warranted if clinically indicated. 6. Additional incidental findings, as above. Electronically Signed   By: Vinnie Langton M.D.   On: 02/01/2016 13:40    ASSESSMENT AND PLAN: This is a very pleasant 75 years old white male with history of stage IIIa non-small cell lung cancer diagnosed in November 2013 status post concurrent chemoradiation in [place] and has been observation since that time. His recent CT scan of the chest showed no clear evidence for disease recurrence of the lung cancer. The patient has other incidental findings including coronary artery disease as well as calcification of aortic and mitral valve. He is followed by Dr. Donnetta Hutching for his peripheral vascular disease. I also recommended for him to discuss the other finding on the scan results with his primary care physician but these are not new findings. I will see him back for follow-up visit in 6 months with repeat CT scan of the chest for restaging of his disease. He was advised to call immediately if he has any concerning symptoms in the interval. The patient voices understanding of current disease status and treatment options and is in agreement with the current care plan.  All questions were answered. The patient knows to call the clinic with any problems, questions or concerns. We can certainly see the patient much  sooner if necessary.  Disclaimer: This note was dictated with voice recognition software. Similar sounding words can inadvertently be transcribed and may not be corrected upon review.

## 2016-02-08 NOTE — Telephone Encounter (Signed)
Follow up appointments and labs scheduled, per 02/08/16 los. A copy of the  AVS report and appointment schedule, was given to patient per 02/08/16 los.

## 2016-02-14 DIAGNOSIS — H353211 Exudative age-related macular degeneration, right eye, with active choroidal neovascularization: Secondary | ICD-10-CM | POA: Diagnosis not present

## 2016-02-14 DIAGNOSIS — H353221 Exudative age-related macular degeneration, left eye, with active choroidal neovascularization: Secondary | ICD-10-CM | POA: Diagnosis not present

## 2016-02-14 DIAGNOSIS — H353231 Exudative age-related macular degeneration, bilateral, with active choroidal neovascularization: Secondary | ICD-10-CM | POA: Diagnosis not present

## 2016-02-22 DIAGNOSIS — I10 Essential (primary) hypertension: Secondary | ICD-10-CM | POA: Diagnosis not present

## 2016-02-22 DIAGNOSIS — I2510 Atherosclerotic heart disease of native coronary artery without angina pectoris: Secondary | ICD-10-CM | POA: Diagnosis not present

## 2016-02-22 DIAGNOSIS — J449 Chronic obstructive pulmonary disease, unspecified: Secondary | ICD-10-CM | POA: Diagnosis not present

## 2016-02-22 DIAGNOSIS — H3530 Unspecified macular degeneration: Secondary | ICD-10-CM | POA: Diagnosis not present

## 2016-02-22 DIAGNOSIS — D0220 Carcinoma in situ of unspecified bronchus and lung: Secondary | ICD-10-CM | POA: Diagnosis not present

## 2016-02-22 DIAGNOSIS — R7301 Impaired fasting glucose: Secondary | ICD-10-CM | POA: Diagnosis not present

## 2016-02-22 DIAGNOSIS — D126 Benign neoplasm of colon, unspecified: Secondary | ICD-10-CM | POA: Diagnosis not present

## 2016-02-22 DIAGNOSIS — R944 Abnormal results of kidney function studies: Secondary | ICD-10-CM | POA: Diagnosis not present

## 2016-02-22 DIAGNOSIS — C61 Malignant neoplasm of prostate: Secondary | ICD-10-CM | POA: Diagnosis not present

## 2016-02-22 DIAGNOSIS — Z85828 Personal history of other malignant neoplasm of skin: Secondary | ICD-10-CM | POA: Diagnosis not present

## 2016-02-22 DIAGNOSIS — R972 Elevated prostate specific antigen [PSA]: Secondary | ICD-10-CM | POA: Diagnosis not present

## 2016-03-13 DIAGNOSIS — H353221 Exudative age-related macular degeneration, left eye, with active choroidal neovascularization: Secondary | ICD-10-CM | POA: Diagnosis not present

## 2016-03-13 DIAGNOSIS — H353211 Exudative age-related macular degeneration, right eye, with active choroidal neovascularization: Secondary | ICD-10-CM | POA: Diagnosis not present

## 2016-03-13 DIAGNOSIS — H353231 Exudative age-related macular degeneration, bilateral, with active choroidal neovascularization: Secondary | ICD-10-CM | POA: Diagnosis not present

## 2016-04-02 ENCOUNTER — Encounter: Payer: Self-pay | Admitting: Cardiology

## 2016-04-02 DIAGNOSIS — I10 Essential (primary) hypertension: Secondary | ICD-10-CM | POA: Diagnosis not present

## 2016-04-02 DIAGNOSIS — R7301 Impaired fasting glucose: Secondary | ICD-10-CM | POA: Diagnosis not present

## 2016-04-02 DIAGNOSIS — R972 Elevated prostate specific antigen [PSA]: Secondary | ICD-10-CM | POA: Diagnosis not present

## 2016-04-02 NOTE — Progress Notes (Signed)
Cardiology Office Note  Date: 04/03/2016   ID: Eric Hinton, DOB 12/23/1940, MRN [id number]  PCP: Wende Neighbors, MD  Consulting Cardiologist: Rozann Lesches, MD   Chief Complaint  Patient presents with  . Coronary artery calcifications    History of Present Illness: Eric Hinton is a 76 y.o. male referred for cardiology consultation by Dr. Nevada Crane. I reviewed his records and updated the chart. He is here today with his wife. CT scan of the chest done in November 2017 revealed evidence of multivessel distribution coronary calcifications as well as calculations of the aortic and mitral valves. We discussed this today, he seemed to be aware of the findings. He does not endorse any obvious angina symptoms, has chronic stable dyspnea on exertion at NYHA class II in general with typical ADLs.  He has known vascular disease status post bilateral CEAs. Has not undergone any prior ischemic testing based on available information.  I reviewed his medications which are stable and outlined below.  I reviewed his ECG today which shows sinus tachycardia. Wife stated that he has always had a "fast heart rate."  Past Medical History:  Diagnosis Date  . Arthritis   . Carotid artery disease ([place])    Status post bilateral CEA - Dr. Donnetta Hutching  . COPD (chronic obstructive pulmonary disease) ([place])   . Essential hypertension   . Hyperlipidemia   . Macular degeneration   . Prostate cancer ([place])   . Skin cancer, basal cell   . Squamous cell lung cancer ([place]) 07/15/2015   Status post chemotherapy and XRT - Dr. Julien Nordmann  . Tubular adenoma of colon     Past Surgical History:  Procedure Laterality Date  . CATARACT EXTRACTION W/ INTRAOCULAR LENS  IMPLANT, BILATERAL Bilateral   . COLONOSCOPY    . ENDARTERECTOMY Right 08/29/2015   Procedure: RIGHT CAROTID ENDARTERECTOMY WITH PATCH ANGIOPLASTY;  Surgeon: Rosetta Posner, MD;  Location: [facility];  Service: Vascular;  Laterality: Right;  . ENDARTERECTOMY Left  10/07/2015   Procedure: LEFT CAROTID ARTERY ENDARTERECTOMY;  Surgeon: Rosetta Posner, MD;  Location: [facility];  Service: Vascular;  Laterality: Left;  . NOSE SURGERY     "rebulit my nose; related to skin cancer"  . PATCH ANGIOPLASTY Left 10/07/2015   Procedure: With [place];  Surgeon: Rosetta Posner, MD;  Location: [facility];  Service: Vascular;  Laterality: Left;  . PROSTATE BIOPSY    . SKIN CANCER EXCISION Right    neck  . TONSILLECTOMY      Current Outpatient Prescriptions  Medication Sig Dispense Refill  . aspirin 325 MG tablet Take 325 mg by mouth daily.    Marland Kitchen atorvastatin (LIPITOR) 10 MG tablet Take 10 mg by mouth daily.    . Cyanocobalamin (VITAMIN B-12) 2500 MCG SUBL Place 2,500 mcg under the tongue daily.    Marland Kitchen latanoprost (XALATAN) 0.005 % ophthalmic solution Place 1 drop into both eyes at bedtime.    Marland Kitchen menthol-zinc oxide (GOLD BOND) powder Apply 1 application topically daily as needed (for itching).    . Multiple Vitamins-Minerals (PRESERVISION/LUTEIN PO) Take 1 tablet by mouth 2 (two) times daily.    . naproxen sodium (ANAPROX) 220 MG tablet Take 440 mg by mouth 2 (two) times daily as needed (for pain).    . tamsulosin (FLOMAX) 0.4 MG CAPS capsule TAKE 1 CAPSULE BY MOUTH DAILY 30 capsule 0  . valsartan (DIOVAN) 320 MG tablet Take 320 mg by mouth daily.  No current facility-administered medications for this visit.    Allergies:  Wellbutrin [bupropion]   Social History: The patient  reports that he has been smoking Cigarettes.  He started smoking about 70 years ago. He has a 103.50 pack-year smoking history. He has never used smokeless tobacco. He reports that he drinks about 21.0 oz of alcohol per week . He reports that he does not use drugs.   Family History: The patient's family history includes Cancer in his mother; Diabetes Mellitus II in his sister; Heart disease in his father; Stroke in his father.   ROS:  Please see the history of present illness.  Otherwise, complete review of systems is positive for declining vision with macular degeneration.  All other systems are reviewed and negative.   Physical Exam: VS:  BP 98/78 (BP Location: Left Arm)   Pulse (!) 112   Ht '5\' 5"'$  (1.651 m)   Wt 185 lb (83.9 kg)   SpO2 96%   BMI 30.79 kg/m , BMI Body mass index is 30.79 kg/m.  Wt Readings from Last 3 Encounters:  04/03/16 185 lb (83.9 kg)  11/03/15 195 lb 11.2 oz (88.8 kg)  11/03/15 194 lb (88 kg)    General: Elderly male, appears comfortable at rest. HEENT: Conjunctiva and lids normal, oropharynx clear. Neck: Supple, no elevated JVP, bilateral CEA scars, no thyromegaly. Lungs: Decreased breath sounds without wheezing, nonlabored breathing at rest. Cardiac: Regular rate and rhythm, no S3 with soft systolic murmur, no pericardial rub. Abdomen: Soft, nontender, bowel sounds present, no guarding or rebound. Extremities: No pitting edema, distal pulses 1-2+. Skin: Warm and dry. Musculoskeletal: No kyphosis. Neuropsychiatric: Alert and oriented x3, affect grossly appropriate.  ECG: I personally reviewed the tracing from 06/08/2015 showed normal sinus rhythm.  Recent Labwork: 02/01/2016: ALT 18; AST 13; BUN 16.5; Creatinine 1.2; HGB 15.1; Platelets 182; Potassium 4.2; Sodium 139   Other Studies Reviewed Today:  Chest CT 02/01/2016: IMPRESSION: 1. Post treatment related changes in the right upper lobe are very similar to prior examination from 08/17/2015, without definitive findings to suggest local recurrence of disease or definite metastatic disease in the thorax. 2. Diffuse centrilobular ground-glass attenuation micro nodularity is again noted, suggesting a smoking related disease such as respiratory bronchiolitis (RB), or if the patient is symptomatic, respiratory bronchiolitis interstitial lung disease (RB-ILD). Counseling for smoking cessation is strongly recommended. 3. Mild diffuse bronchial wall thickening with mild  centrilobular and paraseptal emphysema; imaging findings suggestive of underlying COPD. 4. Aortic atherosclerosis, in addition to 3 vessel coronary artery disease. Assessment for potential risk factor modification, dietary therapy or pharmacologic therapy may be warranted, if clinically indicated. 5. There are calcifications of the aortic valve and mitral valve/annulus. Echocardiographic correlation for evaluation of potential valvular dysfunction may be warranted if clinically indicated. 6. Additional incidental findings, as above.  Assessment and Plan:  1. Multivessel distribution coronary artery calcifications by chest CT imaging in November 2017. He has chronic dyspnea on exertion in the setting of lung disease but no obvious angina symptoms. We will obtain a Lexiscan Myoview to assess ischemic burden.  2. Mitral and aortic valve calcifications by chest CT imaging, echo Sofie Hartigan will also be obtained to better evaluate cardiac structure and function.  3. Hyperlipidemia, on Lipitor. Would aim for LDL control around 70.  4. Bilateral carotid artery disease status post CEA by Dr. Donnetta Hutching.  5. History of squamous cell lung cancer status post chemotherapy and XRT, continues to follow with oncology.  Current medicines were reviewed  with the patient today.   Orders Placed This Encounter  Procedures  . NM Myocar Multi W/Spect W/Wall Motion / EF  . EKG 12-Lead  . ECHOCARDIOGRAM COMPLETE    Disposition: Call with results and determine follow-up.  Signed, Satira Sark, MD, [facility] 04/03/2016 1:45 PM    [facility] [address] [place], [place] [address] Phone: [phone]; Fax: [phone]

## 2016-04-03 ENCOUNTER — Encounter: Payer: Self-pay | Admitting: Cardiology

## 2016-04-03 ENCOUNTER — Ambulatory Visit (INDEPENDENT_AMBULATORY_CARE_PROVIDER_SITE_OTHER): Payer: Medicare Other | Admitting: Cardiology

## 2016-04-03 VITALS — BP 98/78 | HR 112 | Ht 65.0 in | Wt 185.0 lb

## 2016-04-03 DIAGNOSIS — Z85118 Personal history of other malignant neoplasm of bronchus and lung: Secondary | ICD-10-CM | POA: Diagnosis not present

## 2016-04-03 DIAGNOSIS — E782 Mixed hyperlipidemia: Secondary | ICD-10-CM | POA: Diagnosis not present

## 2016-04-03 DIAGNOSIS — I2510 Atherosclerotic heart disease of native coronary artery without angina pectoris: Secondary | ICD-10-CM

## 2016-04-03 DIAGNOSIS — I25119 Atherosclerotic heart disease of native coronary artery with unspecified angina pectoris: Secondary | ICD-10-CM

## 2016-04-03 NOTE — Patient Instructions (Addendum)
Your physician recommends that you schedule a follow-up appointment in: to be determined after tests    Your physician has requested that you have an echocardiogram. Echocardiography is a painless test that uses sound waves to create images of your heart. It provides your doctor with information about the size and shape of your heart and how well your heart's chambers and valves are working. This procedure takes approximately one hour. There are no restrictions for this procedure.    Your physician has requested that you have a lexiscan myoview. For further information please visit [email or web address]. Please follow instruction sheet, as given.      Thank you for choosing [facility] !

## 2016-04-04 DIAGNOSIS — Z85828 Personal history of other malignant neoplasm of skin: Secondary | ICD-10-CM | POA: Diagnosis not present

## 2016-04-04 DIAGNOSIS — J449 Chronic obstructive pulmonary disease, unspecified: Secondary | ICD-10-CM | POA: Diagnosis not present

## 2016-04-04 DIAGNOSIS — D0220 Carcinoma in situ of unspecified bronchus and lung: Secondary | ICD-10-CM | POA: Diagnosis not present

## 2016-04-04 DIAGNOSIS — I2510 Atherosclerotic heart disease of native coronary artery without angina pectoris: Secondary | ICD-10-CM | POA: Diagnosis not present

## 2016-04-04 DIAGNOSIS — C61 Malignant neoplasm of prostate: Secondary | ICD-10-CM | POA: Diagnosis not present

## 2016-04-04 DIAGNOSIS — R944 Abnormal results of kidney function studies: Secondary | ICD-10-CM | POA: Diagnosis not present

## 2016-04-04 DIAGNOSIS — D126 Benign neoplasm of colon, unspecified: Secondary | ICD-10-CM | POA: Diagnosis not present

## 2016-04-04 DIAGNOSIS — R972 Elevated prostate specific antigen [PSA]: Secondary | ICD-10-CM | POA: Diagnosis not present

## 2016-04-04 DIAGNOSIS — H3530 Unspecified macular degeneration: Secondary | ICD-10-CM | POA: Diagnosis not present

## 2016-04-04 DIAGNOSIS — Z720 Tobacco use: Secondary | ICD-10-CM | POA: Diagnosis not present

## 2016-04-10 ENCOUNTER — Ambulatory Visit (HOSPITAL_COMMUNITY)
Admission: RE | Admit: 2016-04-10 | Discharge: 2016-04-10 | Disposition: A | Discharge status: Home / Self Care | Payer: Medicare Other | Source: Ambulatory Visit | Attending: Cardiology | Admitting: Cardiology

## 2016-04-10 ENCOUNTER — Encounter (HOSPITAL_COMMUNITY): Payer: Self-pay

## 2016-04-10 ENCOUNTER — Inpatient Hospital Stay (HOSPITAL_COMMUNITY): Admission: RE | Admit: 2016-04-10 | Payer: Medicare Other | Source: Ambulatory Visit

## 2016-04-10 ENCOUNTER — Encounter (HOSPITAL_COMMUNITY)
Admission: RE | Admit: 2016-04-10 | Discharge: 2016-04-10 | Disposition: A | Discharge status: Home / Self Care | Payer: Medicare Other | Source: Ambulatory Visit | Attending: Cardiology | Admitting: Cardiology

## 2016-04-10 DIAGNOSIS — I25119 Atherosclerotic heart disease of native coronary artery with unspecified angina pectoris: Secondary | ICD-10-CM | POA: Diagnosis not present

## 2016-04-10 DIAGNOSIS — I350 Nonrheumatic aortic (valve) stenosis: Secondary | ICD-10-CM | POA: Diagnosis not present

## 2016-04-10 DIAGNOSIS — I348 Other nonrheumatic mitral valve disorders: Secondary | ICD-10-CM | POA: Diagnosis not present

## 2016-04-10 LAB — NM MYOCAR MULTI W/SPECT W/WALL MOTION / EF
CHL CUP NUCLEAR SSS: 0
CSEPPHR: 109 {beats}/min
LHR: 0.42
LVDIAVOL: 67 mL (ref 62–150)
LVSYSVOL: 25 mL
Rest HR: 93 {beats}/min
SDS: 0
SRS: 0
TID: 1.29

## 2016-04-10 MED ORDER — TECHNETIUM TC 99M TETROFOSMIN IV KIT
10.0000 | PACK | Freq: Once | INTRAVENOUS | Status: AC | PRN
  Administered 2016-04-10: 11.4 via INTRAVENOUS

## 2016-04-10 MED ORDER — SODIUM CHLORIDE 0.9% FLUSH
INTRAVENOUS | Status: AC
  Administered 2016-04-10: 10 mL via INTRAVENOUS
  Filled 2016-04-10: qty 10

## 2016-04-10 MED ORDER — REGADENOSON 0.4 MG/5ML IV SOLN
INTRAVENOUS | Status: AC
  Administered 2016-04-10: 0.4 mg via INTRAVENOUS
  Filled 2016-04-10: qty 5

## 2016-04-10 MED ORDER — TECHNETIUM TC 99M TETROFOSMIN IV KIT
30.0000 | PACK | Freq: Once | INTRAVENOUS | Status: AC | PRN
  Administered 2016-04-10: 30 via INTRAVENOUS

## 2016-04-10 MED ORDER — SODIUM CHLORIDE 0.9% FLUSH
INTRAVENOUS | Status: AC
  Filled 2016-04-10: qty 120

## 2016-04-10 NOTE — Progress Notes (Signed)
*  PRELIMINARY RESULTS* Echocardiogram 2D Echocardiogram has been performed.  Leavy Cella 04/10/2016, 9:21 AM

## 2016-04-24 DIAGNOSIS — H353211 Exudative age-related macular degeneration, right eye, with active choroidal neovascularization: Secondary | ICD-10-CM | POA: Diagnosis not present

## 2016-04-24 DIAGNOSIS — H353231 Exudative age-related macular degeneration, bilateral, with active choroidal neovascularization: Secondary | ICD-10-CM | POA: Diagnosis not present

## 2016-04-24 DIAGNOSIS — H353221 Exudative age-related macular degeneration, left eye, with active choroidal neovascularization: Secondary | ICD-10-CM | POA: Diagnosis not present

## 2016-05-01 ENCOUNTER — Encounter: Payer: Self-pay | Admitting: Vascular Surgery

## 2016-05-08 ENCOUNTER — Ambulatory Visit (INDEPENDENT_AMBULATORY_CARE_PROVIDER_SITE_OTHER): Payer: Medicare Other | Admitting: Vascular Surgery

## 2016-05-08 ENCOUNTER — Encounter: Payer: Self-pay | Admitting: Vascular Surgery

## 2016-05-08 ENCOUNTER — Ambulatory Visit (HOSPITAL_COMMUNITY)
Admission: RE | Admit: 2016-05-08 | Discharge: 2016-05-08 | Disposition: A | Discharge status: Home / Self Care | Payer: Medicare Other | Source: Ambulatory Visit | Attending: Vascular Surgery | Admitting: Vascular Surgery

## 2016-05-08 VITALS — BP 123/72 | HR 70 | Temp 97.6°F | Resp 20 | Ht 65.0 in | Wt 187.2 lb

## 2016-05-08 DIAGNOSIS — I6523 Occlusion and stenosis of bilateral carotid arteries: Secondary | ICD-10-CM

## 2016-05-08 LAB — VAS US CAROTID
LCCADDIAS: -9 cm/s
LCCAPDIAS: 17 cm/s
LCCAPSYS: 101 cm/s
LEFT ECA DIAS: -21 cm/s
LEFT VERTEBRAL DIAS: 10 cm/s
LICADDIAS: -23 cm/s
Left CCA dist sys: -63 cm/s
Left ICA dist sys: -76 cm/s
Left ICA prox dias: -18 cm/s
Left ICA prox sys: -63 cm/s
RCCADSYS: -90 cm/s
RCCAPSYS: 86 cm/s
RIGHT CCA MID DIAS: 15 cm/s
RIGHT ECA DIAS: -11 cm/s
RIGHT VERTEBRAL DIAS: 18 cm/s
Right CCA prox dias: 12 cm/s

## 2016-05-08 NOTE — Progress Notes (Signed)
Vascular and Vein Specialist of [facility]  Patient name: Eric Hinton MRN: [id number] DOB: [birth year] Sex: male  REASON FOR VISIT: Follow-up staged bilateral carotid endarterectomies  HPI: Eric Hinton is a 76 y.o. male here today for follow-up. He underwent right carotid endarterectomy in June 2017 and left carotid endarterectomy in July 2017. He was found to have bilateral critical carotid stenosis. He was being worked up for visual changes which were not classic for amaurosis fugax. He continues to have episodes of his right eye with blurred vision. He denies any episodes of focal weakness or difficulty in speech. He does have some persistent. Incisional numbness more so on the left neck in the right neck.  Past Medical History:  Diagnosis Date  . Arthritis   . Carotid artery disease ([place])    Status post bilateral CEA - Dr. Donnetta Hutching  . COPD (chronic obstructive pulmonary disease) ([place])   . Essential hypertension   . Hyperlipidemia   . Macular degeneration   . Prostate cancer ([place])   . Skin cancer, basal cell   . Squamous cell lung cancer ([place]) 07/15/2015   Status post chemotherapy and XRT - Dr. Julien Nordmann  . Tubular adenoma of colon     Family History  Problem Relation Age of Onset  . Cancer Mother   . Stroke Father   . Heart disease Father   . Diabetes Mellitus II Sister     SOCIAL HISTORY: Social History  Substance Use Topics  . Smoking status: Current Every Day Smoker    Packs/day: 1.50    Years: 69.00    Types: Cigarettes    Start date: 04/03/1946  . Smokeless tobacco: Never Used  . Alcohol use 21.0 oz/week    35 Cans of beer per week     Comment: 10/07/2015 "4-6 beers/day"    Allergies  Allergen Reactions  . Wellbutrin [Bupropion] Hives    Current Outpatient Prescriptions  Medication Sig Dispense Refill  . aspirin 325 MG tablet Take 325 mg by mouth daily.    Marland Kitchen atorvastatin (LIPITOR) 10 MG tablet Take 10 mg by mouth  daily.    . Cyanocobalamin (VITAMIN B-12) 2500 MCG SUBL Place 2,500 mcg under the tongue daily.    Marland Kitchen latanoprost (XALATAN) 0.005 % ophthalmic solution Place 1 drop into both eyes at bedtime.    Marland Kitchen menthol-zinc oxide (GOLD BOND) powder Apply 1 application topically daily as needed (for itching).    . Multiple Vitamins-Minerals (PRESERVISION/LUTEIN PO) Take 1 tablet by mouth 2 (two) times daily.    . naproxen sodium (ANAPROX) 220 MG tablet Take 440 mg by mouth 2 (two) times daily as needed (for pain).    . tamsulosin (FLOMAX) 0.4 MG CAPS capsule TAKE 1 CAPSULE BY MOUTH DAILY 30 capsule 0  . valsartan (DIOVAN) 320 MG tablet Take 320 mg by mouth daily.     No current facility-administered medications for this visit.     REVIEW OF SYSTEMS:  '[X]'$  denotes positive finding, '[ ]'$  denotes negative finding Cardiac  Comments:  Chest pain or chest pressure:    Shortness of breath upon exertion:    Short of breath when lying flat:    Irregular heart rhythm:        Vascular    Pain in calf, thigh, or hip brought on by ambulation:    Pain in feet at night that wakes you up from your sleep:     Blood clot in your veins:    Leg swelling:  PHYSICAL EXAM: Vitals:   05/08/16 1126 05/08/16 1128  BP: 122/75 123/72  Pulse: 70   Resp: 20   Temp: 97.6 F (36.4 C)   TempSrc: Oral   SpO2: 98%   Weight: 187 lb 3.2 oz (84.9 kg)   Height: '5\' 5"'$  (1.651 m)     GENERAL: The patient is a well-nourished male, in no acute distress. The vital signs are documented above. CARDIOVASCULAR: Carotid incisions well-healed with normal pulsation bilaterally. No bruits bilaterally PULMONARY: There is good air exchange  MUSCULOSKELETAL: There are no major deformities or cyanosis. NEUROLOGIC: No focal weakness or paresthesias are detected. SKIN: There are no ulcers or rashes noted. PSYCHIATRIC: The patient has a normal affect.  DATA:  Bilateral carotid duplex were reviewed and discussed with the patient and  his wife present. This shows widely patent endarterectomy site bilaterally with no evidence of recurrent stenosis.  MEDICAL ISSUES: Stable 6 months out from staged bilateral carotid endarterectomies. Explained that if he is continue to have some peri-incisional numbness that this will continue up to a year out from surgery but this may in situ persist. He will notify should he develop any new neurologic deficit and otherwise will be seen in our office in 6 months with repeat carotid duplex. Assuming this is normal would drop back to yearly studies following this.    Rosetta Posner, MD FACS [facility] Tel [phone] Pager [phone]

## 2016-05-16 NOTE — Addendum Note (Signed)
Addended by: Lianne Cure A on: 05/16/2016 04:39 PM   Modules accepted: Orders

## 2016-05-29 DIAGNOSIS — H44111 Panuveitis, right eye: Secondary | ICD-10-CM | POA: Diagnosis not present

## 2016-05-29 DIAGNOSIS — H35033 Hypertensive retinopathy, bilateral: Secondary | ICD-10-CM | POA: Diagnosis not present

## 2016-05-29 DIAGNOSIS — Z961 Presence of intraocular lens: Secondary | ICD-10-CM | POA: Diagnosis not present

## 2016-05-29 DIAGNOSIS — H353231 Exudative age-related macular degeneration, bilateral, with active choroidal neovascularization: Secondary | ICD-10-CM | POA: Diagnosis not present

## 2016-06-05 DIAGNOSIS — H44111 Panuveitis, right eye: Secondary | ICD-10-CM | POA: Diagnosis not present

## 2016-06-05 DIAGNOSIS — H35033 Hypertensive retinopathy, bilateral: Secondary | ICD-10-CM | POA: Diagnosis not present

## 2016-06-05 DIAGNOSIS — Z961 Presence of intraocular lens: Secondary | ICD-10-CM | POA: Diagnosis not present

## 2016-06-05 DIAGNOSIS — H353231 Exudative age-related macular degeneration, bilateral, with active choroidal neovascularization: Secondary | ICD-10-CM | POA: Diagnosis not present

## 2016-06-26 DIAGNOSIS — H353231 Exudative age-related macular degeneration, bilateral, with active choroidal neovascularization: Secondary | ICD-10-CM | POA: Diagnosis not present

## 2016-07-05 DIAGNOSIS — X32XXXA Exposure to sunlight, initial encounter: Secondary | ICD-10-CM | POA: Diagnosis not present

## 2016-07-05 DIAGNOSIS — L570 Actinic keratosis: Secondary | ICD-10-CM | POA: Diagnosis not present

## 2016-07-05 DIAGNOSIS — L308 Other specified dermatitis: Secondary | ICD-10-CM | POA: Diagnosis not present

## 2016-07-05 DIAGNOSIS — I872 Venous insufficiency (chronic) (peripheral): Secondary | ICD-10-CM | POA: Diagnosis not present

## 2016-07-05 DIAGNOSIS — C44519 Basal cell carcinoma of skin of other part of trunk: Secondary | ICD-10-CM | POA: Diagnosis not present

## 2016-07-31 DIAGNOSIS — H44111 Panuveitis, right eye: Secondary | ICD-10-CM | POA: Diagnosis not present

## 2016-07-31 DIAGNOSIS — Z961 Presence of intraocular lens: Secondary | ICD-10-CM | POA: Diagnosis not present

## 2016-07-31 DIAGNOSIS — H35033 Hypertensive retinopathy, bilateral: Secondary | ICD-10-CM | POA: Diagnosis not present

## 2016-07-31 DIAGNOSIS — H353231 Exudative age-related macular degeneration, bilateral, with active choroidal neovascularization: Secondary | ICD-10-CM | POA: Diagnosis not present

## 2016-08-03 ENCOUNTER — Other Ambulatory Visit (HOSPITAL_BASED_OUTPATIENT_CLINIC_OR_DEPARTMENT_OTHER): Payer: Medicare Other

## 2016-08-03 DIAGNOSIS — C3491 Malignant neoplasm of unspecified part of right bronchus or lung: Secondary | ICD-10-CM

## 2016-08-03 DIAGNOSIS — Z85118 Personal history of other malignant neoplasm of bronchus and lung: Secondary | ICD-10-CM

## 2016-08-03 LAB — COMPREHENSIVE METABOLIC PANEL
ALT: 24 U/L (ref 0–55)
AST: 17 U/L (ref 5–34)
Albumin: 3.9 g/dL (ref 3.5–5.0)
Alkaline Phosphatase: 67 U/L (ref 40–150)
Anion Gap: 8 mEq/L (ref 3–11)
BILIRUBIN TOTAL: 0.65 mg/dL (ref 0.20–1.20)
BUN: 19.4 mg/dL (ref 7.0–26.0)
CHLORIDE: 108 meq/L (ref 98–109)
CO2: 23 meq/L (ref 22–29)
Calcium: 9.8 mg/dL (ref 8.4–10.4)
Creatinine: 1.1 mg/dL (ref 0.7–1.3)
EGFR: 67 mL/min/{1.73_m2} — AB (ref >90)
GLUCOSE: 103 mg/dL (ref 70–140)
Potassium: 4.8 mEq/L (ref 3.5–5.1)
SODIUM: 139 meq/L (ref 136–145)
TOTAL PROTEIN: 7 g/dL (ref 6.4–8.3)

## 2016-08-03 LAB — CBC WITH DIFFERENTIAL/PLATELET
BASO%: 0.4 % (ref 0.0–2.0)
Basophils Absolute: 0 10*3/uL (ref 0.0–0.1)
EOS ABS: 0.1 10*3/uL (ref 0.0–0.5)
EOS%: 2 % (ref 0.0–7.0)
HCT: 40.9 % (ref 38.4–49.9)
HGB: 13.8 g/dL (ref 13.0–17.1)
LYMPH%: 27.1 % (ref 14.0–49.0)
MCH: 32.5 pg (ref 27.2–33.4)
MCHC: 33.7 g/dL (ref 32.0–36.0)
MCV: 96.2 fL (ref 79.3–98.0)
MONO#: 0.5 10*3/uL (ref 0.1–0.9)
MONO%: 9.3 % (ref 0.0–14.0)
NEUT%: 61.2 % (ref 39.0–75.0)
NEUTROS ABS: 3.4 10*3/uL (ref 1.5–6.5)
Platelets: 157 10*3/uL (ref 140–400)
RBC: 4.25 10*6/uL (ref 4.20–5.82)
RDW: 13.7 % (ref 11.0–14.6)
WBC: 5.6 10*3/uL (ref 4.0–10.3)
lymph#: 1.5 10*3/uL (ref 0.9–3.3)

## 2016-08-08 ENCOUNTER — Encounter: Payer: Self-pay | Admitting: Internal Medicine

## 2016-08-08 ENCOUNTER — Ambulatory Visit (HOSPITAL_COMMUNITY)
Admission: RE | Admit: 2016-08-08 | Discharge: 2016-08-08 | Disposition: A | Discharge status: Home / Self Care | Payer: Medicare Other | Source: Ambulatory Visit | Attending: Vascular Surgery | Admitting: Vascular Surgery

## 2016-08-08 ENCOUNTER — Ambulatory Visit (HOSPITAL_BASED_OUTPATIENT_CLINIC_OR_DEPARTMENT_OTHER): Payer: Medicare Other | Admitting: Internal Medicine

## 2016-08-08 ENCOUNTER — Encounter (HOSPITAL_COMMUNITY): Payer: Self-pay

## 2016-08-08 VITALS — BP 127/68 | HR 88 | Temp 98.6°F | Resp 20 | Ht 65.0 in | Wt 190.2 lb

## 2016-08-08 DIAGNOSIS — C3491 Malignant neoplasm of unspecified part of right bronchus or lung: Secondary | ICD-10-CM

## 2016-08-08 DIAGNOSIS — Z85118 Personal history of other malignant neoplasm of bronchus and lung: Secondary | ICD-10-CM

## 2016-08-08 MED ORDER — IOPAMIDOL (ISOVUE-300) INJECTION 61%
75.0000 mL | Freq: Once | INTRAVENOUS | Status: AC | PRN
  Administered 2016-08-08: 75 mL via INTRAVENOUS

## 2016-08-08 MED ORDER — IOPAMIDOL (ISOVUE-300) INJECTION 61%
INTRAVENOUS | Status: AC
  Filled 2016-08-08: qty 75

## 2016-08-08 NOTE — Progress Notes (Signed)
[facility] Telephone:([phone]   Fax:([phone]) [place], [place] [address]  DIAGNOSIS: Clinically stage IIIA (T1a, N2, M0) non-small cell lung cancer, poorly differentiated squamous cell carcinoma diagnosed in November 2013   PRIOR THERAPY: status post course of concurrent chemoradiation with weekly carboplatin and paclitaxel when the patient was in [place].   CURRENT THERAPY: Observation.  INTERVAL HISTORY: Eric Hinton 76 y.o. male returns to the clinic today for follow-up visit accompanied by his wife.  The patient is feeling fine today with no specific complaints.  He denied having any chest pain, shortness of breath, cough or hemoptysis. She denied having any weight loss or night sweats. He has no nausea, vomiting, diarrhea or constipation. He was supposed to have repeat CT scan of the chest before this visit but unfortunately no one called him to scheduled the scan.   MEDICAL HISTORY: Past Medical History:  Diagnosis Date  . Arthritis   . Carotid artery disease ([place])    Status post bilateral CEA - Dr. Donnetta Hutching  . COPD (chronic obstructive pulmonary disease) ([place])   . Essential hypertension   . Hyperlipidemia   . Macular degeneration   . Prostate cancer ([place])   . Skin cancer, basal cell   . Squamous cell lung cancer ([place]) 07/15/2015   Status post chemotherapy and XRT - Dr. Julien Nordmann  . Tubular adenoma of colon     ALLERGIES:  is allergic to wellbutrin [bupropion].  MEDICATIONS:  Current Outpatient Prescriptions  Medication Sig Dispense Refill  . aspirin 325 MG tablet Take 325 mg by mouth daily.    Marland Kitchen atorvastatin (LIPITOR) 10 MG tablet Take 10 mg by mouth daily.    . Cyanocobalamin (VITAMIN B-12) 2500 MCG SUBL Place 2,500 mcg under the tongue daily.    Marland Kitchen latanoprost (XALATAN) 0.005 % ophthalmic solution Place 1 drop into both eyes at bedtime.    Marland Kitchen menthol-zinc oxide (GOLD BOND) powder  Apply 1 application topically daily as needed (for itching).    . Multiple Vitamins-Minerals (PRESERVISION AREDS 2) CAPS Take by mouth.    . Multiple Vitamins-Minerals (PRESERVISION/LUTEIN PO) Take 1 tablet by mouth 2 (two) times daily.    . naproxen sodium (ANAPROX) 220 MG tablet Take 440 mg by mouth 2 (two) times daily as needed (for pain).    . tamsulosin (FLOMAX) 0.4 MG CAPS capsule TAKE 1 CAPSULE BY MOUTH DAILY 30 capsule 0  . valsartan (DIOVAN) 320 MG tablet Take 320 mg by mouth daily.     No current facility-administered medications for this visit.     SURGICAL HISTORY:  Past Surgical History:  Procedure Laterality Date  . CATARACT EXTRACTION W/ INTRAOCULAR LENS  IMPLANT, BILATERAL Bilateral   . COLONOSCOPY    . ENDARTERECTOMY Right 08/29/2015   Procedure: RIGHT CAROTID ENDARTERECTOMY WITH PATCH ANGIOPLASTY;  Surgeon: Rosetta Posner, MD;  Location: [facility];  Service: Vascular;  Laterality: Right;  . ENDARTERECTOMY Left 10/07/2015   Procedure: LEFT CAROTID ARTERY ENDARTERECTOMY;  Surgeon: Rosetta Posner, MD;  Location: [facility];  Service: Vascular;  Laterality: Left;  . NOSE SURGERY     "rebulit my nose; related to skin cancer"  . PATCH ANGIOPLASTY Left 10/07/2015   Procedure: With [place];  Surgeon: Rosetta Posner, MD;  Location: [facility];  Service: Vascular;  Laterality: Left;  . PROSTATE BIOPSY    . SKIN CANCER EXCISION Right  neck  . TONSILLECTOMY      REVIEW OF SYSTEMS:  A comprehensive review of systems was negative.   PHYSICAL EXAMINATION: General appearance: alert, cooperative and no distress Head: Normocephalic, without obvious abnormality, atraumatic Neck: no adenopathy, no JVD, supple, symmetrical, trachea midline and thyroid not enlarged, symmetric, no tenderness/mass/nodules Lymph nodes: Cervical, supraclavicular, and axillary nodes normal. Resp: clear to auscultation bilaterally Back: symmetric, no curvature. ROM normal. No CVA  tenderness. Cardio: regular rate and rhythm, S1, S2 normal, no murmur, click, rub or gallop GI: soft, non-tender; bowel sounds normal; no masses,  no organomegaly Extremities: extremities normal, atraumatic, no cyanosis or edema  ECOG PERFORMANCE STATUS: 1 - Symptomatic but completely ambulatory  Blood pressure 127/68, pulse 88, temperature 98.6 F (37 C), temperature source Oral, resp. rate 20, height '5\' 5"'$  (1.651 m), weight 190 lb 3.2 oz (86.3 kg), SpO2 99 %.  LABORATORY DATA: Lab Results  Component Value Date   WBC 5.6 08/03/2016   HGB 13.8 08/03/2016   HCT 40.9 08/03/2016   MCV 96.2 08/03/2016   PLT 157 08/03/2016      Chemistry      Component Value Date/Time   NA 139 08/03/2016 1029   K 4.8 08/03/2016 1029   CL 106 10/08/2015 0439   CO2 23 08/03/2016 1029   BUN 19.4 08/03/2016 1029   CREATININE 1.1 08/03/2016 1029      Component Value Date/Time   CALCIUM 9.8 08/03/2016 1029   ALKPHOS 67 08/03/2016 1029   AST 17 08/03/2016 1029   ALT 24 08/03/2016 1029   BILITOT 0.65 08/03/2016 1029       RADIOGRAPHIC STUDIES: No results found.  ASSESSMENT AND PLAN:  This is a very pleasant 76 years old white male with history of stage IIIa non-small cell lung cancer diagnosed in November 2013 status post course of concurrent chemoradiation in [place]. The patient has been observation with no clear evidence for disease progression. He was supposed to have repeat CT scan of the chest before this visit but the ordered scan was not scheduled. Will start the scan is scheduled in the next few days. No evidence for disease recurrence or progression, I would see him back for follow-up visit in 6 months with repeat CT scan of the chest. The patient was advised to call immediately if he has any concerning symptoms in the interval. The patient voices understanding of current disease status and treatment options and is in agreement with the current care plan. All questions were answered.  The patient knows to call the clinic with any problems, questions or concerns. We can certainly see the patient much sooner if necessary. I spent 10 minutes counseling the patient face to face. The total time spent in the appointment was 15 minutes.  Disclaimer: This note was dictated with voice recognition software. Similar sounding words can inadvertently be transcribed and may not be corrected upon review.

## 2016-08-09 DIAGNOSIS — Z85828 Personal history of other malignant neoplasm of skin: Secondary | ICD-10-CM | POA: Diagnosis not present

## 2016-08-09 DIAGNOSIS — Z08 Encounter for follow-up examination after completed treatment for malignant neoplasm: Secondary | ICD-10-CM | POA: Diagnosis not present

## 2016-08-13 ENCOUNTER — Telehealth: Payer: Self-pay | Admitting: Medical Oncology

## 2016-08-13 NOTE — Telephone Encounter (Signed)
Request results

## 2016-08-14 ENCOUNTER — Telehealth: Payer: Self-pay | Admitting: Medical Oncology

## 2016-08-14 NOTE — Telephone Encounter (Signed)
LVM on wifes mobile.

## 2016-08-14 NOTE — Telephone Encounter (Signed)
-----   Message from Curt Bears, MD sent at 08/13/2016  5:32 PM EDT ----- Regarding: RE: ct results Scan was good. He needs follow up and scan in 6 months. Already ordered ----- Message ----- From: Ardeen Garland, RN Sent: 08/13/2016  11:07 AM To: Curt Bears, MD Subject: ct results                                     Pt wanting results.-no f/u

## 2016-08-28 DIAGNOSIS — H401131 Primary open-angle glaucoma, bilateral, mild stage: Secondary | ICD-10-CM | POA: Insufficient documentation

## 2016-08-28 DIAGNOSIS — Z961 Presence of intraocular lens: Secondary | ICD-10-CM | POA: Diagnosis not present

## 2016-08-28 DIAGNOSIS — H353231 Exudative age-related macular degeneration, bilateral, with active choroidal neovascularization: Secondary | ICD-10-CM | POA: Diagnosis not present

## 2016-09-11 DIAGNOSIS — H353231 Exudative age-related macular degeneration, bilateral, with active choroidal neovascularization: Secondary | ICD-10-CM | POA: Diagnosis not present

## 2016-09-11 DIAGNOSIS — H4311 Vitreous hemorrhage, right eye: Secondary | ICD-10-CM | POA: Insufficient documentation

## 2016-09-11 DIAGNOSIS — H353221 Exudative age-related macular degeneration, left eye, with active choroidal neovascularization: Secondary | ICD-10-CM | POA: Diagnosis not present

## 2016-10-09 DIAGNOSIS — H353231 Exudative age-related macular degeneration, bilateral, with active choroidal neovascularization: Secondary | ICD-10-CM | POA: Diagnosis not present

## 2016-10-09 DIAGNOSIS — H353221 Exudative age-related macular degeneration, left eye, with active choroidal neovascularization: Secondary | ICD-10-CM | POA: Diagnosis not present

## 2016-10-30 ENCOUNTER — Encounter: Payer: Self-pay | Admitting: Family

## 2016-11-06 DIAGNOSIS — H353231 Exudative age-related macular degeneration, bilateral, with active choroidal neovascularization: Secondary | ICD-10-CM | POA: Diagnosis not present

## 2016-11-06 DIAGNOSIS — H353221 Exudative age-related macular degeneration, left eye, with active choroidal neovascularization: Secondary | ICD-10-CM | POA: Diagnosis not present

## 2016-11-08 ENCOUNTER — Ambulatory Visit: Payer: Medicare Other | Admitting: Family

## 2016-11-08 ENCOUNTER — Ambulatory Visit (HOSPITAL_COMMUNITY): Payer: Medicare Other

## 2016-12-11 DIAGNOSIS — H353231 Exudative age-related macular degeneration, bilateral, with active choroidal neovascularization: Secondary | ICD-10-CM | POA: Diagnosis not present

## 2016-12-11 DIAGNOSIS — H353221 Exudative age-related macular degeneration, left eye, with active choroidal neovascularization: Secondary | ICD-10-CM | POA: Diagnosis not present

## 2016-12-19 DIAGNOSIS — I10 Essential (primary) hypertension: Secondary | ICD-10-CM | POA: Diagnosis not present

## 2016-12-19 DIAGNOSIS — Z6831 Body mass index (BMI) 31.0-31.9, adult: Secondary | ICD-10-CM | POA: Diagnosis not present

## 2017-01-08 DIAGNOSIS — H353231 Exudative age-related macular degeneration, bilateral, with active choroidal neovascularization: Secondary | ICD-10-CM | POA: Diagnosis not present

## 2017-01-08 DIAGNOSIS — H353221 Exudative age-related macular degeneration, left eye, with active choroidal neovascularization: Secondary | ICD-10-CM | POA: Diagnosis not present

## 2017-01-09 ENCOUNTER — Other Ambulatory Visit: Payer: Self-pay | Admitting: Medical Oncology

## 2017-01-09 ENCOUNTER — Telehealth: Payer: Self-pay | Admitting: Medical Oncology

## 2017-01-09 DIAGNOSIS — C3491 Malignant neoplasm of unspecified part of right bronchus or lung: Secondary | ICD-10-CM

## 2017-01-09 NOTE — Telephone Encounter (Signed)
Message left with [name] that lab orders are at [facility] to be done in Nov prior  to Ct scan

## 2017-01-09 NOTE — Addendum Note (Signed)
Addended by: Ardeen Garland on: 01/09/2017 12:10 PM   Modules accepted: Orders

## 2017-01-09 NOTE — Addendum Note (Signed)
Addended by: Ardeen Garland on: 01/09/2017 12:11 PM   Modules accepted: Orders

## 2017-01-10 ENCOUNTER — Ambulatory Visit (HOSPITAL_COMMUNITY)
Admission: RE | Admit: 2017-01-10 | Discharge: 2017-01-10 | Disposition: A | Discharge status: Home / Self Care | Payer: Medicare Other | Source: Ambulatory Visit | Attending: Vascular Surgery | Admitting: Vascular Surgery

## 2017-01-10 ENCOUNTER — Encounter: Payer: Self-pay | Admitting: Family

## 2017-01-10 ENCOUNTER — Ambulatory Visit (INDEPENDENT_AMBULATORY_CARE_PROVIDER_SITE_OTHER): Payer: Medicare Other | Admitting: Family

## 2017-01-10 VITALS — BP 146/73 | HR 79 | Temp 98.1°F | Resp 20 | Ht 65.0 in | Wt 191.0 lb

## 2017-01-10 DIAGNOSIS — I6523 Occlusion and stenosis of bilateral carotid arteries: Secondary | ICD-10-CM

## 2017-01-10 DIAGNOSIS — Z8679 Personal history of other diseases of the circulatory system: Secondary | ICD-10-CM | POA: Diagnosis not present

## 2017-01-10 DIAGNOSIS — Z9889 Other specified postprocedural states: Secondary | ICD-10-CM | POA: Diagnosis not present

## 2017-01-10 LAB — VAS US CAROTID
LCCADDIAS: -17 cm/s
LCCADSYS: -68 cm/s
LEFT ECA DIAS: -21 cm/s
LEFT VERTEBRAL DIAS: 9 cm/s
LICADDIAS: -22 cm/s
LICADSYS: -75 cm/s
LICAPDIAS: -11 cm/s
Left CCA prox dias: 13 cm/s
Left CCA prox sys: 77 cm/s
Left ICA prox sys: -59 cm/s
RCCAPDIAS: 11 cm/s
RIGHT CCA MID DIAS: 18 cm/s
RIGHT ECA DIAS: -10 cm/s
RIGHT VERTEBRAL DIAS: -13 cm/s
Right CCA prox sys: 70 cm/s
Right cca dist sys: -102 cm/s

## 2017-01-10 NOTE — Patient Instructions (Signed)
Steps to Quit Smoking Smoking tobacco can be bad for your health. It can also affect almost every organ in your body. Smoking puts you and people around you at risk for many serious long-lasting (chronic) diseases. Quitting smoking is hard, but it is one of the best things that you can do for your health. It is never too late to quit. What are the benefits of quitting smoking? When you quit smoking, you lower your risk for getting serious diseases and conditions. They can include:  Lung cancer or lung disease.  Heart disease.  Stroke.  Heart attack.  Not being able to have children (infertility).  Weak bones (osteoporosis) and broken bones (fractures).  If you have coughing, wheezing, and shortness of breath, those symptoms may get better when you quit. You may also get sick less often. If you are pregnant, quitting smoking can help to lower your chances of having a baby of low birth weight. What can I do to help me quit smoking? Talk with your doctor about what can help you quit smoking. Some things you can do (strategies) include:  Quitting smoking totally, instead of slowly cutting back how much you smoke over a period of time.  Going to in-person counseling. You are more likely to quit if you go to many counseling sessions.  Using resources and support systems, such as: ? Online chats with a counselor. ? Phone quitlines. ? Printed self-help materials. ? Support groups or group counseling. ? Text messaging programs. ? Mobile phone apps or applications.  Taking medicines. Some of these medicines may have nicotine in them. If you are pregnant or breastfeeding, do not take any medicines to quit smoking unless your doctor says it is okay. Talk with your doctor about counseling or other things that can help you.  Talk with your doctor about using more than one strategy at the same time, such as taking medicines while you are also going to in-person counseling. This can help make  quitting easier. What things can I do to make it easier to quit? Quitting smoking might feel very hard at first, but there is a lot that you can do to make it easier. Take these steps:  Talk to your family and friends. Ask them to support and encourage you.  Call phone quitlines, reach out to support groups, or work with a counselor.  Ask people who smoke to not smoke around you.  Avoid places that make you want (trigger) to smoke, such as: ? Bars. ? Parties. ? Smoke-break areas at work.  Spend time with people who do not smoke.  Lower the stress in your life. Stress can make you want to smoke. Try these things to help your stress: ? Getting regular exercise. ? Deep-breathing exercises. ? Yoga. ? Meditating. ? Doing a body scan. To do this, close your eyes, focus on one area of your body at a time from head to toe, and notice which parts of your body are tense. Try to relax the muscles in those areas.  Download or buy apps on your mobile phone or tablet that can help you stick to your quit plan. There are many free apps, such as QuitGuide from the CDC (Centers for Disease Control and Prevention). You can find more support from smokefree.gov and other websites.  This information is not intended to replace advice given to you by your health care provider. Make sure you discuss any questions you have with your health care provider. Document Released: 01/06/2009 Document   Revised: 11/08/2015 Document Reviewed: 07/27/2014 Elsevier Interactive Patient Education  2018 [place].      Preventing Cerebrovascular Disease Arteries are blood vessels that carry blood that contains oxygen from the heart to all parts of the body. Cerebrovascular disease affects arteries that supply the brain. Any condition that blocks or disrupts blood flow to the brain can cause cerebrovascular disease. Brain cells that lose blood supply start to die within minutes (stroke). Stroke is the main danger of  cerebrovascular disease. Atherosclerosis and high blood pressure are common causes of cerebrovascular disease. Atherosclerosis is narrowing and hardening of an artery that results when fat, cholesterol, calcium, or other substances (plaque) build up inside an artery. Plaque reduces blood flow through the artery. High blood pressure increases the risk of bleeding inside the brain. Making diet and lifestyle changes to prevent atherosclerosis and high blood pressure lowers your risk of cerebrovascular disease. What nutrition changes can be made?  Eat more fruits, vegetables, and whole grains.  Reduce how much saturated fat you eat. To do this, eat less red meat and fewer full-fat dairy products.  Eat healthy proteins instead of red meat. Healthy proteins include: ? Fish. Eat fish that contains heart-healthy omega-3 fatty acids, twice a week. Examples include salmon, albacore tuna, mackerel, and herring. ? Chicken. ? Nuts. ? Low-fat or nonfat yogurt.  Avoid processed meats, like bacon and lunchmeat.  Avoid foods that contain: ? A lot of sugar, such as sweets and drinks with added sugar. ? A lot of salt (sodium). Avoid adding extra salt to your food, as told by your health care provider. ? Trans fats, such as margarine and baked goods. Trans fats may be listed as "partially hydrogenated oils" on food labels.  Check food labels to see how much sodium, sugar, and trans fats are in foods.  Use vegetable oils that contain low amounts of saturated fat, such as olive oil or canola oil. What lifestyle changes can be made?  Drink alcohol in moderation. This means no more than 1 drink a day for nonpregnant women and 2 drinks a day for men. One drink equals 12 oz of beer, 5 oz of wine, or 1 oz of hard liquor.  If you are overweight, ask your health care provider to recommend a weight-loss plan for you. Losing 5-10 lb (2.2-4.5 kg) can reduce your risk of diabetes, atherosclerosis, and high blood  pressure.  Exercise for 30?60 minutes on most days, or as much as told by your health care provider. ? Do moderate-intensity exercise, such as brisk walking, bicycling, and water aerobics. Ask your health care provider which activities are safe for you.  Do not use any products that contain nicotine or tobacco, such as cigarettes and e-cigarettes. If you need help quitting, ask your health care provider. Why are these changes important? Making these changes lowers your risk of many diseases that can cause cerebrovascular disease and stroke. Stroke is a leading cause of death and disability. Making these changes also improves your overall health and quality of life. What can I do to lower my risk? The following factors make you more likely to develop cerebrovascular disease:  Being overweight.  Smoking.  Being physically inactive.  Eating a high-fat diet.  Having certain health conditions, such as: ? Diabetes. ? High blood pressure. ? Heart disease. ? Atherosclerosis. ? High cholesterol. ? Sickle cell disease.  Talk with your health care provider about your risk for cerebrovascular disease. Work with your health care provider to control diseases  that you have that may contribute to cerebrovascular disease. Your health care provider may prescribe medicines to help prevent major causes of cerebrovascular disease. Where to find more information: Learn more about preventing cerebrovascular disease from:  [place], Lung, and [place]: MoAnalyst.de  Centers for Disease Control and Prevention: http://www.curry-wood.biz/  Summary  Cerebrovascular disease can lead to a stroke.  Atherosclerosis and high blood pressure are major causes of cerebrovascular disease.  Making diet and lifestyle changes can reduce your risk of cerebrovascular disease.  Work with your health care provider to get your risk factors under control to reduce your  risk of cerebrovascular disease. This information is not intended to replace advice given to you by your health care provider. Make sure you discuss any questions you have with your health care provider. Document Released: 03/27/2015 Document Revised: 09/30/2015 Document Reviewed: 03/27/2015 Elsevier Interactive Patient Education  2018 [employer].    Stroke Prevention Some medical conditions and behaviors are associated with an increased chance of having a stroke. You may prevent a stroke by making healthy choices and managing medical conditions. How can I reduce my risk of having a stroke?  Stay physically active. Get at least 30 minutes of activity on most or all days.  Do not smoke. It may also be helpful to avoid exposure to secondhand smoke.  Limit alcohol use. Moderate alcohol use is considered to be: ? No more than 2 drinks per day for men. ? No more than 1 drink per day for nonpregnant women.  Eat healthy foods. This involves: ? Eating 5 or more servings of fruits and vegetables a day. ? Making dietary changes that address high blood pressure (hypertension), high cholesterol, diabetes, or obesity.  Manage your cholesterol levels. ? Making food choices that are high in fiber and low in saturated fat, trans fat, and cholesterol may control cholesterol levels. ? Take any prescribed medicines to control cholesterol as directed by your health care provider.  Manage your diabetes. ? Controlling your carbohydrate and sugar intake is recommended to manage diabetes. ? Take any prescribed medicines to control diabetes as directed by your health care provider.  Control your hypertension. ? Making food choices that are low in salt (sodium), saturated fat, trans fat, and cholesterol is recommended to manage hypertension. ? Ask your health care provider if you need treatment to lower your blood pressure. Take any prescribed medicines to control hypertension as directed by your health  care provider. ? If you are 45-54 years of age, have your blood pressure checked every 3-5 years. If you are 67 years of age or older, have your blood pressure checked every year.  Maintain a healthy weight. ? Reducing calorie intake and making food choices that are low in sodium, saturated fat, trans fat, and cholesterol are recommended to manage weight.  Stop drug abuse.  Avoid taking birth control pills. ? Talk to your health care provider about the risks of taking birth control pills if you are over 61 years old, smoke, get migraines, or have ever had a blood clot.  Get evaluated for sleep disorders (sleep apnea). ? Talk to your health care provider about getting a sleep evaluation if you snore a lot or have excessive sleepiness.  Take medicines only as directed by your health care provider. ? For some people, aspirin or blood thinners (anticoagulants) are helpful in reducing the risk of forming abnormal blood clots that can lead to stroke. If you have the irregular heart rhythm of atrial fibrillation,  you should be on a blood thinner unless there is a good reason you cannot take them. ? Understand all your medicine instructions.  Make sure that other conditions (such as anemia or atherosclerosis) are addressed. Get help right away if:  You have sudden weakness or numbness of the face, arm, or leg, especially on one side of the body.  Your face or eyelid droops to one side.  You have sudden confusion.  You have trouble speaking (aphasia) or understanding.  You have sudden trouble seeing in one or both eyes.  You have sudden trouble walking.  You have dizziness.  You have a loss of balance or coordination.  You have a sudden, severe headache with no known cause.  You have new chest pain or an irregular heartbeat. Any of these symptoms may represent a serious problem that is an emergency. Do not wait to see if the symptoms will go away. Get medical help at once. Call your  local emergency services (911 in [place] Do not drive yourself to the hospital. This information is not intended to replace advice given to you by your health care provider. Make sure you discuss any questions you have with your health care provider. Document Released: 04/19/2004 Document Revised: 08/18/2015 Document Reviewed: 09/12/2012 Elsevier Interactive Patient Education  2017 [employer].

## 2017-01-10 NOTE — Progress Notes (Signed)
Chief Complaint: Follow up Extracranial Carotid Artery Stenosis   History of Present Illness  Eric Hinton is a 76 y.o. male returns today for follow-up. He underwent right carotid endarterectomy in June 2017 and left carotid endarterectomy in July 2017 by Dr. Donnetta Hutching. He was found to have bilateral critical carotid stenosis. He was being worked up for visual changes which were not classic for amaurosis fugax. He continues to have episodes of his right eye with blurred vision. He has macular degeneration and glaucoma in both eyes. He receives injections in the left eye, wife states injections in the right eye did not help.    He denies any episodes of focal weakness or difficulty in speech. He does have some persistent incisional numbness more so on the left neck in the right neck.  He denies any known history of stroke or TIA. Specifically he denies a history of amaurosis fugax or monocular blindness, unilateral facial drooping, hemiplegia, or receptive or expressive aphasia.    He reports low back and hip pain with walking. He has known degenerative disc disease in c-spine and lumbar spine.  He does not seem to have claudication sx's with walking.   Dr. Donnetta Hutching last evaluated pt on 05-08-16. At that time pt was stable 6 months out from staged bilateral carotid endarterectomies. Dr. Donnetta Hutching explained that if he continues to have some peri-incisional numbness that this will continue up to a year out from surgery but this may in situ persist. He was to be seen in our office in 6 months with repeat carotid duplex. Assuming this is normal would drop back to yearly studies following this.   Pt Diabetic: no Pt smoker: smoker  (2 ppd, started at age 68 yrs) He was treated for lung cancer with chemo and radiation, no surgery for this.   Pt meds include: Statin : yes ASA: yes Other anticoagulants/antiplatelets: no   Past Medical History:  Diagnosis Date  . Arthritis   . Carotid artery disease  ([place])    Status post bilateral CEA - Dr. Donnetta Hutching  . COPD (chronic obstructive pulmonary disease) ([place])   . Essential hypertension   . Hyperlipidemia   . Macular degeneration   . Prostate cancer ([place])   . Skin cancer, basal cell   . Squamous cell lung cancer ([place]) 07/15/2015   Status post chemotherapy and XRT - Dr. Julien Nordmann  . Tubular adenoma of colon     Social History Social History  Substance Use Topics  . Smoking status: Current Every Day Smoker    Packs/day: 1.50    Years: 69.00    Types: Cigarettes    Start date: [birth year]1946  . Smokeless tobacco: Never Used  . Alcohol use 21.0 oz/week    35 Cans of beer per week     Comment: 10/07/2015 "4-6 beers/day"    Family History Family History  Problem Relation Age of Onset  . Cancer Mother   . Stroke Father   . Heart disease Father   . Diabetes Mellitus II Sister     Surgical History Past Surgical History:  Procedure Laterality Date  . CATARACT EXTRACTION W/ INTRAOCULAR LENS  IMPLANT, BILATERAL Bilateral   . COLONOSCOPY    . ENDARTERECTOMY Right 08/29/2015   Procedure: RIGHT CAROTID ENDARTERECTOMY WITH PATCH ANGIOPLASTY;  Surgeon: Rosetta Posner, MD;  Location: [facility];  Service: Vascular;  Laterality: Right;  . ENDARTERECTOMY Left 10/07/2015   Procedure: LEFT CAROTID ARTERY ENDARTERECTOMY;  Surgeon: Rosetta Posner, MD;  [facility]  OR;  Service: Vascular;  Laterality: Left;  . NOSE SURGERY     "rebulit my nose; related to skin cancer"  . PATCH ANGIOPLASTY Left 10/07/2015   Procedure: With [place];  Surgeon: Rosetta Posner, MD;  Location: [facility];  Service: Vascular;  Laterality: Left;  . PROSTATE BIOPSY    . SKIN CANCER EXCISION Right    neck  . TONSILLECTOMY      Allergies  Allergen Reactions  . Wellbutrin [Bupropion] Hives    Current Outpatient Prescriptions  Medication Sig Dispense Refill  . aspirin 325 MG tablet Take 325 mg by mouth daily.    Marland Kitchen atorvastatin (LIPITOR) 10 MG tablet Take 10 mg by  mouth daily.    . Cyanocobalamin (VITAMIN B-12) 2500 MCG SUBL Place 2,500 mcg under the tongue daily.    Marland Kitchen latanoprost (XALATAN) 0.005 % ophthalmic solution Place 1 drop into both eyes at bedtime.    Marland Kitchen menthol-zinc oxide (GOLD BOND) powder Apply 1 application topically daily as needed (for itching).    . Multiple Vitamins-Minerals (PRESERVISION AREDS 2) CAPS Take by mouth.    . Multiple Vitamins-Minerals (PRESERVISION/LUTEIN PO) Take 1 tablet by mouth 2 (two) times daily.    . naproxen sodium (ANAPROX) 220 MG tablet Take 440 mg by mouth 2 (two) times daily as needed (for pain).    . tamsulosin (FLOMAX) 0.4 MG CAPS capsule TAKE 1 CAPSULE BY MOUTH DAILY 30 capsule 0  . valsartan (DIOVAN) 320 MG tablet Take 320 mg by mouth daily.     No current facility-administered medications for this visit.     Review of Systems : See HPI for pertinent positives and negatives.  Physical Examination  Vitals:   01/10/17 0928 01/10/17 0929  BP: (!) 151/81 (!) 146/73  Pulse: 79   Resp: 20   Temp: 98.1 F (36.7 C)   TempSrc: Oral   SpO2: 96%   Weight: 191 lb (86.6 kg)   Height: 5\' 5"  (1.651 m)    Body mass index is 31.78 kg/m.  General: WDWN obese male in NAD, accompanied by his wife GAIT: normal Eyes: PERRLA Pulmonary:  Respirations are non-labored, fair air movement, + scattered rales, no rhonchi, or wheezing.  Cardiac: regular rhythm, no detected murmur.  VASCULAR EXAM Carotid Bruits Right Left   Negative Negative     Abdominal aortic pulse is not palpable. Radial pulses are 2+ palpable and equal.                                                                                                                            LE Pulses Right Left       FEMORAL  2+ palpable  2+ palpable        POPLITEAL  not palpable   not palpable       POSTERIOR TIBIAL  2+ palpable   1+ palpable        DORSALIS PEDIS      ANTERIOR  TIBIAL 1+ palpable  not palpable     Gastrointestinal: soft,  nontender, BS WNL, no r/g, no palpable masses.  Musculoskeletal: No muscle atrophy/wasting. M/S 5/5 throughout, extremities without ischemic changes.  Skin: No rashes, no ulcers, no cellulitis.    Neurologic:  A&O X 3; appropriate affect, sensation is normal; speech is normal, CN 2-12 intact except is hard of hearing, pain and light touch intact in extremities, motor exam as listed above.    Assessment: Eric Hinton is a 76 y.o. male who is s/p right carotid endarterectomy in June 2017 and left carotid endarterectomy in July 2017. He has no history of stroke or TIA. His visual changes seem to be a result of his bilateral macular degeneration and glaucoma.   Fortunately he does not have DM, but unfortunately he continues to smoke since age 86, currently 2 ppd; he has a hx of lung cancer and COPD.  DATA Carotid Duplex (01/10/17): Bilateral CEA sites with no restenosis or hyperplasia.  Bilateral vertebral artery flow is antegrade.  Bilateral subclavian artery waveforms are normal.  No significant change compared to the exam on 05-08-16.    Plan:  The patient was counseled re smoking cessation and given several free resources re smoking cessation.   Follow-up in 1 year with Carotid Duplex scan.   I discussed in depth with the patient the nature of atherosclerosis, and emphasized the importance of maximal medical management including strict control of blood pressure, blood glucose, and lipid levels, obtaining regular exercise, and cessation of smoking.  The patient is aware that without maximal medical management the underlying atherosclerotic disease process will progress, limiting the benefit of any interventions. The patient was given information about stroke prevention and what symptoms should prompt the patient to seek immediate medical care. Thank you for allowing [place] to participate in this patient's care.  Clemon Chambers, RN, MSN, FNP-C [facility] Office: [phone]  Clinic Physician: Oneida Alar  01/10/17 9:59 AM

## 2017-01-16 DIAGNOSIS — Z23 Encounter for immunization: Secondary | ICD-10-CM | POA: Diagnosis not present

## 2017-01-29 NOTE — Addendum Note (Signed)
Addended by: Lianne Cure A on: 01/29/2017 03:37 PM   Modules accepted: Orders

## 2017-02-05 ENCOUNTER — Ambulatory Visit (HOSPITAL_COMMUNITY): Payer: Medicare Other

## 2017-02-05 ENCOUNTER — Other Ambulatory Visit: Payer: Medicare Other

## 2017-02-07 ENCOUNTER — Ambulatory Visit: Payer: Medicare Other | Admitting: Internal Medicine

## 2017-02-08 ENCOUNTER — Other Ambulatory Visit (HOSPITAL_COMMUNITY)
Admission: RE | Admit: 2017-02-08 | Discharge: 2017-02-08 | Disposition: A | Discharge status: Home / Self Care | Payer: Medicare Other | Source: Ambulatory Visit | Attending: Internal Medicine | Admitting: Internal Medicine

## 2017-02-08 ENCOUNTER — Ambulatory Visit (HOSPITAL_COMMUNITY)
Admission: RE | Admit: 2017-02-08 | Discharge: 2017-02-08 | Disposition: A | Discharge status: Home / Self Care | Payer: Medicare Other | Source: Ambulatory Visit | Attending: Internal Medicine | Admitting: Internal Medicine

## 2017-02-08 ENCOUNTER — Encounter (HOSPITAL_COMMUNITY): Payer: Self-pay

## 2017-02-08 DIAGNOSIS — C3491 Malignant neoplasm of unspecified part of right bronchus or lung: Secondary | ICD-10-CM | POA: Diagnosis not present

## 2017-02-08 DIAGNOSIS — I700 Atherosclerosis of aorta: Secondary | ICD-10-CM | POA: Insufficient documentation

## 2017-02-08 LAB — CBC WITH DIFFERENTIAL/PLATELET
BASOS ABS: 0 10*3/uL (ref 0.0–0.1)
Basophils Relative: 1 %
EOS ABS: 0.2 10*3/uL (ref 0.0–0.7)
EOS PCT: 3 %
HCT: 39.4 % (ref 39.0–52.0)
Hemoglobin: 13.4 g/dL (ref 13.0–17.0)
LYMPHS PCT: 26 %
Lymphs Abs: 1.4 10*3/uL (ref 0.7–4.0)
MCH: 32.9 pg (ref 26.0–34.0)
MCHC: 34 g/dL (ref 30.0–36.0)
MCV: 96.8 fL (ref 78.0–100.0)
Monocytes Absolute: 0.4 10*3/uL (ref 0.1–1.0)
Monocytes Relative: 9 %
Neutro Abs: 3.2 10*3/uL (ref 1.7–7.7)
Neutrophils Relative %: 61 %
PLATELETS: 150 10*3/uL (ref 150–400)
RBC: 4.07 MIL/uL — AB (ref 4.22–5.81)
RDW: 13.1 % (ref 11.5–15.5)
WBC: 5.1 10*3/uL (ref 4.0–10.5)

## 2017-02-08 LAB — COMPREHENSIVE METABOLIC PANEL
ALT: 20 U/L (ref 17–63)
AST: 19 U/L (ref 15–41)
Albumin: 3.6 g/dL (ref 3.5–5.0)
Alkaline Phosphatase: 56 U/L (ref 38–126)
Anion gap: 7 (ref 5–15)
BUN: 17 mg/dL (ref 6–20)
CHLORIDE: 102 mmol/L (ref 101–111)
CO2: 25 mmol/L (ref 22–32)
CREATININE: 0.98 mg/dL (ref 0.61–1.24)
Calcium: 8.9 mg/dL (ref 8.9–10.3)
GFR calc Af Amer: >60 mL/min (ref >60)
GFR calc non Af Amer: >60 mL/min (ref >60)
Glucose, Bld: 103 mg/dL — ABNORMAL HIGH (ref 65–99)
Potassium: 3.7 mmol/L (ref 3.5–5.1)
SODIUM: 134 mmol/L — AB (ref 135–145)
Total Bilirubin: 0.7 mg/dL (ref 0.3–1.2)
Total Protein: 6.6 g/dL (ref 6.5–8.1)

## 2017-02-08 LAB — POCT I-STAT CREATININE: CREATININE: 1 mg/dL (ref 0.61–1.24)

## 2017-02-08 MED ORDER — IOPAMIDOL (ISOVUE-300) INJECTION 61%
75.0000 mL | Freq: Once | INTRAVENOUS | Status: AC | PRN
  Administered 2017-02-08: 75 mL via INTRAVENOUS

## 2017-02-11 ENCOUNTER — Encounter (INDEPENDENT_AMBULATORY_CARE_PROVIDER_SITE_OTHER): Payer: Self-pay | Admitting: *Deleted

## 2017-02-12 ENCOUNTER — Telehealth: Payer: Self-pay | Admitting: Internal Medicine

## 2017-02-12 ENCOUNTER — Encounter: Payer: Self-pay | Admitting: *Deleted

## 2017-02-12 ENCOUNTER — Encounter: Payer: Self-pay | Admitting: Internal Medicine

## 2017-02-12 ENCOUNTER — Ambulatory Visit (HOSPITAL_BASED_OUTPATIENT_CLINIC_OR_DEPARTMENT_OTHER): Payer: Medicare Other | Admitting: Internal Medicine

## 2017-02-12 VITALS — BP 138/74 | HR 92 | Temp 98.2°F | Resp 18 | Ht 65.0 in | Wt 193.5 lb

## 2017-02-12 DIAGNOSIS — Z923 Personal history of irradiation: Secondary | ICD-10-CM | POA: Diagnosis not present

## 2017-02-12 DIAGNOSIS — Z85118 Personal history of other malignant neoplasm of bronchus and lung: Secondary | ICD-10-CM | POA: Diagnosis not present

## 2017-02-12 DIAGNOSIS — Z9221 Personal history of antineoplastic chemotherapy: Secondary | ICD-10-CM

## 2017-02-12 DIAGNOSIS — C3491 Malignant neoplasm of unspecified part of right bronchus or lung: Secondary | ICD-10-CM

## 2017-02-12 NOTE — Telephone Encounter (Signed)
Gave patient calender and AVS per 11/20 - per MM all appts in 6 months - patient is aware of appt date and time. Lab and CT done at [facility]

## 2017-02-12 NOTE — Progress Notes (Signed)
[place] [phone]   Fax:([phone]) [place], MD [place] [address]  DIAGNOSIS: Clinically stage IIIA (T1a, N2, M0) non-small cell lung cancer, poorly differentiated squamous cell carcinoma diagnosed in November 2013   PRIOR THERAPY: status post course of concurrent chemoradiation with weekly carboplatin and paclitaxel when the patient was in [place].   CURRENT THERAPY: Observation.  INTERVAL HISTORY: Eric Hinton 76 y.o. male returns to the clinic today for follow-up visit accompanied by his wife.  The patient is feeling fine with no specific complaints.  He denied having any significant chest pain, but has shortness of breath with exertion, cough or hemoptysis.  He denied having any weight loss or night sweats.  He has no nausea, vomiting, diarrhea or constipation.  He denied having any fever or chills.  The patient had a repeat CT scan of the chest performed recently and is here for evaluation and discussion of his discuss results.   MEDICAL HISTORY: Past Medical History:  Diagnosis Date  . Arthritis   . Carotid artery disease ([place])    Status post bilateral CEA - Dr. Donnetta Hutching  . COPD (chronic obstructive pulmonary disease) ([place])   . Essential hypertension   . Hyperlipidemia   . Macular degeneration   . Prostate cancer ([place])   . Skin cancer, basal cell   . Squamous cell lung cancer ([place]) 07/15/2015   Status post chemotherapy and XRT - Dr. Julien Nordmann  . Tubular adenoma of colon     ALLERGIES:  is allergic to wellbutrin [bupropion].  MEDICATIONS:  Current Outpatient Medications  Medication Sig Dispense Refill  . aspirin 325 MG tablet Take 325 mg by mouth daily.    Marland Kitchen atorvastatin (LIPITOR) 10 MG tablet Take 10 mg by mouth daily.    . Cyanocobalamin (VITAMIN B-12) 2500 MCG SUBL Place 2,500 mcg under the tongue daily.    Marland Kitchen latanoprost (XALATAN) 0.005 % ophthalmic solution Place 1 drop  into both eyes at bedtime.    Marland Kitchen menthol-zinc oxide (GOLD BOND) powder Apply 1 application topically daily as needed (for itching).    . Multiple Vitamins-Minerals (PRESERVISION AREDS 2) CAPS Take by mouth.    . Multiple Vitamins-Minerals (PRESERVISION/LUTEIN PO) Take 1 tablet by mouth 2 (two) times daily.    . naproxen sodium (ANAPROX) 220 MG tablet Take 440 mg by mouth 2 (two) times daily as needed (for pain).    . tamsulosin (FLOMAX) 0.4 MG CAPS capsule TAKE 1 CAPSULE BY MOUTH DAILY 30 capsule 0  . valsartan (DIOVAN) 320 MG tablet Take 320 mg by mouth daily.     No current facility-administered medications for this visit.     SURGICAL HISTORY:  Past Surgical History:  Procedure Laterality Date  . CATARACT EXTRACTION W/ INTRAOCULAR LENS  IMPLANT, BILATERAL Bilateral   . COLONOSCOPY    . ENDARTERECTOMY Right 08/29/2015   Procedure: RIGHT CAROTID ENDARTERECTOMY WITH PATCH ANGIOPLASTY;  Surgeon: Rosetta Posner, MD;  Location: [facility];  Service: Vascular;  Laterality: Right;  . ENDARTERECTOMY Left 10/07/2015   Procedure: LEFT CAROTID ARTERY ENDARTERECTOMY;  Surgeon: Rosetta Posner, MD;  Location: [facility];  Service: Vascular;  Laterality: Left;  . NOSE SURGERY     "rebulit my nose; related to skin cancer"  . PATCH ANGIOPLASTY Left 10/07/2015   Procedure: With [place];  Surgeon: Rosetta Posner, MD;  Location: [facility];  Service: Vascular;  Laterality: Left;  .  PROSTATE BIOPSY    . SKIN CANCER EXCISION Right    neck  . TONSILLECTOMY      REVIEW OF SYSTEMS:  A comprehensive review of systems was negative except for: Respiratory: positive for dyspnea on exertion   PHYSICAL EXAMINATION: General appearance: alert, cooperative and no distress Head: Normocephalic, without obvious abnormality, atraumatic Neck: no adenopathy, no JVD, supple, symmetrical, trachea midline and thyroid not enlarged, symmetric, no tenderness/mass/nodules Lymph nodes: Cervical, supraclavicular, and  axillary nodes normal. Resp: clear to auscultation bilaterally Back: symmetric, no curvature. ROM normal. No CVA tenderness. Cardio: regular rate and rhythm, S1, S2 normal, no murmur, click, rub or gallop GI: soft, non-tender; bowel sounds normal; no masses,  no organomegaly Extremities: extremities normal, atraumatic, no cyanosis or edema  ECOG PERFORMANCE STATUS: 1 - Symptomatic but completely ambulatory  Blood pressure 138/74, pulse 92, temperature 98.2 F (36.8 C), temperature source Oral, resp. rate 18, height 5\' 5"  (1.651 m), weight 193 lb 8 oz (87.8 kg), SpO2 95 %.  LABORATORY DATA: Lab Results  Component Value Date   WBC 5.1 02/08/2017   HGB 13.4 02/08/2017   HCT 39.4 02/08/2017   MCV 96.8 02/08/2017   PLT 150 02/08/2017      Chemistry      Component Value Date/Time   NA 134 (L) 02/08/2017 0928   NA 139 08/03/2016 1029   K 3.7 02/08/2017 0928   K 4.8 08/03/2016 1029   CL 102 02/08/2017 0928   CO2 25 02/08/2017 0928   CO2 23 08/03/2016 1029   BUN 17 02/08/2017 0928   BUN 19.4 08/03/2016 1029   CREATININE 0.98 02/08/2017 0928   CREATININE 1.1 08/03/2016 1029      Component Value Date/Time   CALCIUM 8.9 02/08/2017 0928   CALCIUM 9.8 08/03/2016 1029   ALKPHOS 56 02/08/2017 0928   ALKPHOS 67 08/03/2016 1029   AST 19 02/08/2017 0928   AST 17 08/03/2016 1029   ALT 20 02/08/2017 0928   ALT 24 08/03/2016 1029   BILITOT 0.7 02/08/2017 0928   BILITOT 0.65 08/03/2016 1029       RADIOGRAPHIC STUDIES: Ct Chest W Contrast  Result Date: 02/08/2017 CLINICAL DATA:  Squamous cell carcinoma right lung. EXAM: CT CHEST WITH CONTRAST TECHNIQUE: Multidetector CT imaging of the chest was performed during intravenous contrast administration. CONTRAST:  14mL ISOVUE-300 IOPAMIDOL (ISOVUE-300) INJECTION 61% COMPARISON:  08/08/2016 FINDINGS: Cardiovascular: The heart size is normal. No pericardial effusion. Coronary artery calcification is evident. Atherosclerotic calcification is  noted in the wall of the thoracic aorta. Mediastinum/Nodes: 7 mm short axis subcarinal lymph node measured previously is 5 mm short axis today. 10 mm short axis right hilar lymph node measured previously is 11 mm short axis today. The esophagus has normal imaging features. There is no axillary lymphadenopathy. Lungs/Pleura: Stable post radiation change anterior right upper lobe. Somewhat irregular nodular opacity in the inferior right lower lobe adjacent to the minor fissure is stable and also presumably treatment related. As noted previously, there is diffuse centrilobular ground-glass nodularity in both lungs compatible with smoking related lung disease (respiratory bronchiolitis). 4 mm right lower lobe pulmonary nodule (image 100 series 4) is stable in the interval. Upper Abdomen: multiple hepatic cysts are unchanged. Adrenal glands unremarkable. Exophytic left renal cyst incompletely visualized but measures up to at least 7.8 cm similar to abdomen and pelvis CT from 09/09/2009. Musculoskeletal: Bone windows reveal no worrisome lytic or sclerotic osseous lesions. IMPRESSION: 1. Stable exam.  No new or progressive findings. 2. Stable  radiation changes right lung. 3. Coronary artery and Aortic Atherosclerois (ICD10-170.0) Electronically Signed   By: Misty Stanley M.D.   On: 02/08/2017 16:03    ASSESSMENT AND PLAN:  This is a very pleasant 76 years old white male with history of stage IIIa non-small cell lung cancer diagnosed in November 2013 status post course of concurrent chemoradiation in [place]. The patient has been observation with no clear evidence for disease progression. Repeat CT scan of the chest was performed recently and showed no evidence for new or progressive disease. I discussed the scan results with the patient and his wife and recommended for him to continue on observation with repeat CT scan of the chest in 6 months. The patient was advised to call immediately if he has any concerning  symptoms in the interval. The patient voices understanding of current disease status and treatment options and is in agreement with the current care plan. All questions were answered. The patient knows to call the clinic with any problems, questions or concerns. We can certainly see the patient much sooner if necessary. I spent 10 minutes counseling the patient face to face. The total time spent in the appointment was 15 minutes.  Disclaimer: This note was dictated with voice recognition software. Similar sounding words can inadvertently be transcribed and may not be corrected upon review.

## 2017-02-19 DIAGNOSIS — Z0000 Encounter for general adult medical examination without abnormal findings: Secondary | ICD-10-CM | POA: Diagnosis not present

## 2017-02-19 DIAGNOSIS — I10 Essential (primary) hypertension: Secondary | ICD-10-CM | POA: Diagnosis not present

## 2017-02-19 DIAGNOSIS — Z6831 Body mass index (BMI) 31.0-31.9, adult: Secondary | ICD-10-CM | POA: Diagnosis not present

## 2017-02-19 DIAGNOSIS — H353221 Exudative age-related macular degeneration, left eye, with active choroidal neovascularization: Secondary | ICD-10-CM | POA: Diagnosis not present

## 2017-02-19 DIAGNOSIS — H353211 Exudative age-related macular degeneration, right eye, with active choroidal neovascularization: Secondary | ICD-10-CM | POA: Diagnosis not present

## 2017-04-01 DIAGNOSIS — C61 Malignant neoplasm of prostate: Secondary | ICD-10-CM | POA: Diagnosis not present

## 2017-04-01 DIAGNOSIS — R972 Elevated prostate specific antigen [PSA]: Secondary | ICD-10-CM | POA: Diagnosis not present

## 2017-04-01 DIAGNOSIS — R7301 Impaired fasting glucose: Secondary | ICD-10-CM | POA: Diagnosis not present

## 2017-04-01 DIAGNOSIS — I10 Essential (primary) hypertension: Secondary | ICD-10-CM | POA: Diagnosis not present

## 2017-04-02 DIAGNOSIS — H353221 Exudative age-related macular degeneration, left eye, with active choroidal neovascularization: Secondary | ICD-10-CM | POA: Diagnosis not present

## 2017-04-02 DIAGNOSIS — H35033 Hypertensive retinopathy, bilateral: Secondary | ICD-10-CM | POA: Diagnosis not present

## 2017-04-02 DIAGNOSIS — H353231 Exudative age-related macular degeneration, bilateral, with active choroidal neovascularization: Secondary | ICD-10-CM | POA: Diagnosis not present

## 2017-04-03 ENCOUNTER — Encounter (INDEPENDENT_AMBULATORY_CARE_PROVIDER_SITE_OTHER): Payer: Self-pay | Admitting: *Deleted

## 2017-04-03 ENCOUNTER — Other Ambulatory Visit (INDEPENDENT_AMBULATORY_CARE_PROVIDER_SITE_OTHER): Payer: Self-pay | Admitting: Internal Medicine

## 2017-04-03 ENCOUNTER — Telehealth (INDEPENDENT_AMBULATORY_CARE_PROVIDER_SITE_OTHER): Payer: Self-pay | Admitting: *Deleted

## 2017-04-03 DIAGNOSIS — Z860100 Personal history of colon polyps, unspecified: Secondary | ICD-10-CM | POA: Insufficient documentation

## 2017-04-03 MED ORDER — PEG 3350-KCL-NA BICARB-NACL 420 G PO SOLR: 4000.0000 mL | Freq: Once | ORAL | 0 refills | Status: AC

## 2017-04-03 NOTE — Telephone Encounter (Signed)
Patient needs trilyte 

## 2017-04-11 DIAGNOSIS — J449 Chronic obstructive pulmonary disease, unspecified: Secondary | ICD-10-CM | POA: Diagnosis not present

## 2017-04-11 DIAGNOSIS — Z85828 Personal history of other malignant neoplasm of skin: Secondary | ICD-10-CM | POA: Diagnosis not present

## 2017-04-11 DIAGNOSIS — Z720 Tobacco use: Secondary | ICD-10-CM | POA: Diagnosis not present

## 2017-04-11 DIAGNOSIS — I10 Essential (primary) hypertension: Secondary | ICD-10-CM | POA: Diagnosis not present

## 2017-04-11 DIAGNOSIS — Z86010 Personal history of colonic polyps: Secondary | ICD-10-CM | POA: Diagnosis not present

## 2017-04-11 DIAGNOSIS — I348 Other nonrheumatic mitral valve disorders: Secondary | ICD-10-CM | POA: Diagnosis not present

## 2017-04-11 DIAGNOSIS — I2510 Atherosclerotic heart disease of native coronary artery without angina pectoris: Secondary | ICD-10-CM | POA: Diagnosis not present

## 2017-04-11 DIAGNOSIS — H3530 Unspecified macular degeneration: Secondary | ICD-10-CM | POA: Diagnosis not present

## 2017-04-11 DIAGNOSIS — I6529 Occlusion and stenosis of unspecified carotid artery: Secondary | ICD-10-CM | POA: Diagnosis not present

## 2017-04-11 DIAGNOSIS — R972 Elevated prostate specific antigen [PSA]: Secondary | ICD-10-CM | POA: Diagnosis not present

## 2017-04-11 DIAGNOSIS — R7301 Impaired fasting glucose: Secondary | ICD-10-CM | POA: Diagnosis not present

## 2017-04-11 DIAGNOSIS — C61 Malignant neoplasm of prostate: Secondary | ICD-10-CM | POA: Diagnosis not present

## 2017-04-19 ENCOUNTER — Telehealth (INDEPENDENT_AMBULATORY_CARE_PROVIDER_SITE_OTHER): Payer: Self-pay | Admitting: *Deleted

## 2017-04-19 NOTE — Telephone Encounter (Signed)
Referring MD/PCP: hall   Procedure: tcs w propofol  Reason/Indication:  Hx polyps  Has patient had this procedure before?  Yes, 2013  If so, when, by whom and where?    Is there a family history of colon cancer?  no  Who?  What age when diagnosed?    Is patient diabetic?   no      Does patient have prosthetic heart valve or mechanical valve?  no  Do you have a pacemaker?  no  Has patient ever had endocarditis? no  Has patient had joint replacement within last 12 months?  no  Is patient constipated or take laxatives? Yes, constipation  Does patient have a history of alcohol/drug use?  Drinks 4-8 beers a day  Is patient on Coumadin, Plavix and/or Aspirin? yes  Medications: see epic  Allergies: see epic  Medication Adjustment per Dr Laural Golden: asa 2 days  Procedure date & time: 05/17/17 at 1015

## 2017-04-22 NOTE — Telephone Encounter (Signed)
agree

## 2017-05-08 NOTE — Patient Instructions (Signed)
Eric Hinton  05/08/2017     @PREFPERIOPPHARMACY @   Your procedure is scheduled on  05/17/2017   Report to [facility] at  54  A.M.  Call this number if you have problems the morning of surgery:  [phone]   Remember:  Do not eat food or drink liquids after midnight.  Take these medicines the morning of surgery with A SIP OF WATER  Prozac, cozaar, flomax.   Do not wear jewelry, make-up or nail polish.  Do not wear lotions, powders, or perfumes, or deodorant.  Do not shave 48 hours prior to surgery.  Men may shave face and neck.  Do not bring valuables to the hospital.  [facility] is not responsible for any belongings or valuables.  Contacts, dentures or bridgework may not be worn into surgery.  Leave your suitcase in the car.  After surgery it may be brought to your room.  For patients admitted to the hospital, discharge time will be determined by your treatment team.  Patients discharged the day of surgery will not be allowed to drive home.   Name and phone number of your driver:   family Special instructions:  Follow the diet and prep instructions given to you by Dr Olevia Perches office.  Please read over the following fact sheets that you were given. Anesthesia Post-op Instructions and Care and Recovery After Surgery       Colonoscopy, Adult A colonoscopy is an exam to look at the large intestine. It is done to check for problems, such as:  Lumps (tumors).  Growths (polyps).  Swelling (inflammation).  Bleeding.  What happens before the procedure? Eating and drinking Follow instructions from your doctor about eating and drinking. These instructions may include:  A few days before the procedure - follow a low-fiber diet. ? Avoid nuts. ? Avoid seeds. ? Avoid dried fruit. ? Avoid raw fruits. ? Avoid vegetables.  1-3 days before the procedure - follow a clear liquid diet. Avoid liquids that have red or purple dye. Drink only clear liquids,  such as: ? Clear broth or bouillon. ? Black coffee or tea. ? Clear juice. ? Clear soft drinks or sports drinks. ? Gelatin dessert. ? Popsicles.  On the day of the procedure - do not eat or drink anything during the 2 hours before the procedure.  Bowel prep If you were prescribed an oral bowel prep:  Take it as told by your doctor. Starting the day before your procedure, you will need to drink a lot of liquid. The liquid will cause you to poop (have bowel movements) until your poop is almost clear or light green.  If your skin or butt gets irritated from diarrhea, you may: ? Wipe the area with wipes that have medicine in them, such as adult wet wipes with aloe and vitamin E. ? Put something on your skin that soothes the area, such as petroleum jelly.  If you throw up (vomit) while drinking the bowel prep, take a break for up to 60 minutes. Then begin the bowel prep again. If you keep throwing up and you cannot take the bowel prep without throwing up, call your doctor.  General instructions  Ask your doctor about changing or stopping your normal medicines. This is important if you take diabetes medicines or blood thinners.  Plan to have someone take you home from the hospital or clinic. What happens during the procedure?  An IV tube may  be put into one of your veins.  You will be given medicine to help you relax (sedative).  To reduce your risk of infection: ? Your doctors will wash their hands. ? Your anal area will be washed with soap.  You will be asked to lie on your side with your knees bent.  Your doctor will get a long, thin, flexible tube ready. The tube will have a camera and a light on the end.  The tube will be put into your anus.  The tube will be gently put into your large intestine.  Air will be delivered into your large intestine to keep it open. You may feel some pressure or cramping.  The camera will be used to take photos.  A small tissue sample may be  removed from your body to be looked at under a microscope (biopsy). If any possible problems are found, the tissue will be sent to a lab for testing.  If small growths are found, your doctor may remove them and have them checked for cancer.  The tube that was put into your anus will be slowly removed. The procedure may vary among doctors and hospitals. What happens after the procedure?  Your doctor will check on you often until the medicines you were given have worn off.  Do not drive for 24 hours after the procedure.  You may have a small amount of blood in your poop.  You may pass gas.  You may have mild cramps or bloating in your belly (abdomen).  It is up to you to get the results of your procedure. Ask your doctor, or the department performing the procedure, when your results will be ready. This information is not intended to replace advice given to you by your health care provider. Make sure you discuss any questions you have with your health care provider. Document Released: 04/14/2010 Document Revised: 01/11/2016 Document Reviewed: 05/24/2015 Elsevier Interactive Patient Education  2017 [employer]  Colonoscopy, Adult, Care After This sheet gives you information about how to care for yourself after your procedure. Your health care provider may also give you more specific instructions. If you have problems or questions, contact your health care provider. What can I expect after the procedure? After the procedure, it is common to have:  A small amount of blood in your stool for 24 hours after the procedure.  Some gas.  Mild abdominal cramping or bloating.  Follow these instructions at home: General instructions   For the first 24 hours after the procedure: ? Do not drive or use machinery. ? Do not sign important documents. ? Do not drink alcohol. ? Do your regular daily activities at a slower pace than normal. ? Eat soft, easy-to-digest foods. ? Rest  often.  Take over-the-counter or prescription medicines only as told by your health care provider.  It is up to you to get the results of your procedure. Ask your health care provider, or the department performing the procedure, when your results will be ready. Relieving cramping and bloating  Try walking around when you have cramps or feel bloated.  Apply heat to your abdomen as told by your health care provider. Use a heat source that your health care provider recommends, such as a moist heat pack or a heating pad. ? Place a towel between your skin and the heat source. ? Leave the heat on for 20-30 minutes. ? Remove the heat if your skin turns bright red. This is especially important if you  are unable to feel pain, heat, or cold. You may have a greater risk of getting burned. Eating and drinking  Drink enough fluid to keep your urine clear or pale yellow.  Resume your normal diet as instructed by your health care provider. Avoid heavy or fried foods that are hard to digest.  Avoid drinking alcohol for as long as instructed by your health care provider. Contact a health care provider if:  You have blood in your stool 2-3 days after the procedure. Get help right away if:  You have more than a small spotting of blood in your stool.  You pass large blood clots in your stool.  Your abdomen is swollen.  You have nausea or vomiting.  You have a fever.  You have increasing abdominal pain that is not relieved with medicine. This information is not intended to replace advice given to you by your health care provider. Make sure you discuss any questions you have with your health care provider. Document Released: 10/25/2003 Document Revised: 12/05/2015 Document Reviewed: 05/24/2015 Elsevier Interactive Patient Education  2018 [place] Anesthesia is a term that refers to techniques, procedures, and medicines that help a person stay safe and comfortable  during a medical procedure. Monitored anesthesia care, or sedation, is one type of anesthesia. Your anesthesia specialist may recommend sedation if you will be having a procedure that does not require you to be unconscious, such as:  Cataract surgery.  A dental procedure.  A biopsy.  A colonoscopy.  During the procedure, you may receive a medicine to help you relax (sedative). There are three levels of sedation:  Mild sedation. At this level, you may feel awake and relaxed. You will be able to follow directions.  Moderate sedation. At this level, you will be sleepy. You may not remember the procedure.  Deep sedation. At this level, you will be asleep. You will not remember the procedure.  The more medicine you are given, the deeper your level of sedation will be. Depending on how you respond to the procedure, the anesthesia specialist may change your level of sedation or the type of anesthesia to fit your needs. An anesthesia specialist will monitor you closely during the procedure. Let your health care provider know about:  Any allergies you have.  All medicines you are taking, including vitamins, herbs, eye drops, creams, and over-the-counter medicines.  Any use of steroids (by mouth or as a cream).  Any problems you or family members have had with sedatives and anesthetic medicines.  Any blood disorders you have.  Any surgeries you have had.  Any medical conditions you have, such as sleep apnea.  Whether you are pregnant or may be pregnant.  Any use of cigarettes, alcohol, or street drugs. What are the risks? Generally, this is a safe procedure. However, problems may occur, including:  Getting too much medicine (oversedation).  Nausea.  Allergic reaction to medicines.  Trouble breathing. If this happens, a breathing tube may be used to help with breathing. It will be removed when you are awake and breathing on your own.  Heart trouble.  Lung trouble.  Before  the procedure Staying hydrated Follow instructions from your health care provider about hydration, which may include:  Up to 2 hours before the procedure - you may continue to drink clear liquids, such as water, clear fruit juice, black coffee, and plain tea.  Eating and drinking restrictions Follow instructions from your health care provider about eating and drinking,  which may include:  8 hours before the procedure - stop eating heavy meals or foods such as meat, fried foods, or fatty foods.  6 hours before the procedure - stop eating light meals or foods, such as toast or cereal.  6 hours before the procedure - stop drinking milk or drinks that contain milk.  2 hours before the procedure - stop drinking clear liquids.  Medicines Ask your health care provider about:  Changing or stopping your regular medicines. This is especially important if you are taking diabetes medicines or blood thinners.  Taking medicines such as aspirin and ibuprofen. These medicines can thin your blood. Do not take these medicines before your procedure if your health care provider instructs you not to.  Tests and exams  You will have a physical exam.  You may have blood tests done to show: ? How well your kidneys and liver are working. ? How well your blood can clot.  General instructions  Plan to have someone take you home from the hospital or clinic.  If you will be going home right after the procedure, plan to have someone with you for 24 hours.  What happens during the procedure?  Your blood pressure, heart rate, breathing, level of pain and overall condition will be monitored.  An IV tube will be inserted into one of your veins.  Your anesthesia specialist will give you medicines as needed to keep you comfortable during the procedure. This may mean changing the level of sedation.  The procedure will be performed. After the procedure  Your blood pressure, heart rate, breathing rate, and  blood oxygen level will be monitored until the medicines you were given have worn off.  Do not drive for 24 hours if you received a sedative.  You may: ? Feel sleepy, clumsy, or nauseous. ? Feel forgetful about what happened after the procedure. ? Have a sore throat if you had a breathing tube during the procedure. ? Vomit. This information is not intended to replace advice given to you by your health care provider. Make sure you discuss any questions you have with your health care provider. Document Released: 12/06/2004 Document Revised: 08/19/2015 Document Reviewed: 07/03/2015 Elsevier Interactive Patient Education  2018 [place], Care After These instructions provide you with information about caring for yourself after your procedure. Your health care provider may also give you more specific instructions. Your treatment has been planned according to current medical practices, but problems sometimes occur. Call your health care provider if you have any problems or questions after your procedure. What can I expect after the procedure? After your procedure, it is common to:  Feel sleepy for several hours.  Feel clumsy and have poor balance for several hours.  Feel forgetful about what happened after the procedure.  Have poor judgment for several hours.  Feel nauseous or vomit.  Have a sore throat if you had a breathing tube during the procedure.  Follow these instructions at home: For at least 24 hours after the procedure:   Do not: ? Participate in activities in which you could fall or become injured. ? Drive. ? Use heavy machinery. ? Drink alcohol. ? Take sleeping pills or medicines that cause drowsiness. ? Make important decisions or sign legal documents. ? Take care of children on your own.  Rest. Eating and drinking  Follow the diet that is recommended by your health care provider.  If you vomit, drink water, juice, or soup when you  can drink without vomiting.  Make sure you have little or no nausea before eating solid foods. General instructions  Have a responsible adult stay with you until you are awake and alert.  Take over-the-counter and prescription medicines only as told by your health care provider.  If you smoke, do not smoke without supervision.  Keep all follow-up visits as told by your health care provider. This is important. Contact a health care provider if:  You keep feeling nauseous or you keep vomiting.  You feel light-headed.  You develop a rash.  You have a fever. Get help right away if:  You have trouble breathing. This information is not intended to replace advice given to you by your health care provider. Make sure you discuss any questions you have with your health care provider. Document Released: 07/03/2015 Document Revised: 11/02/2015 Document Reviewed: 07/03/2015 Elsevier Interactive Patient Education  [employer].

## 2017-05-09 ENCOUNTER — Encounter (HOSPITAL_COMMUNITY): Payer: Self-pay

## 2017-05-09 ENCOUNTER — Encounter (HOSPITAL_COMMUNITY)
Admission: RE | Admit: 2017-05-09 | Discharge: 2017-05-09 | Disposition: A | Discharge status: Home / Self Care | Payer: Medicare Other | Source: Ambulatory Visit | Attending: Internal Medicine | Admitting: Internal Medicine

## 2017-05-09 ENCOUNTER — Other Ambulatory Visit: Payer: Self-pay

## 2017-05-09 HISTORY — DX: Other cervical disc degeneration, unspecified cervical region: M50.30

## 2017-05-09 HISTORY — DX: Other intervertebral disc degeneration, lumbar region: M51.36

## 2017-05-09 HISTORY — DX: Other intervertebral disc degeneration, lumbar region without mention of lumbar back pain or lower extremity pain: M51.369

## 2017-05-09 HISTORY — DX: Constipation, unspecified: K59.00

## 2017-05-10 ENCOUNTER — Other Ambulatory Visit (HOSPITAL_COMMUNITY): Payer: Medicare Other

## 2017-05-17 ENCOUNTER — Other Ambulatory Visit: Payer: Self-pay

## 2017-05-17 ENCOUNTER — Ambulatory Visit (HOSPITAL_COMMUNITY): Payer: Medicare Other | Admitting: Anesthesiology

## 2017-05-17 ENCOUNTER — Encounter (HOSPITAL_COMMUNITY): Payer: Self-pay | Admitting: *Deleted

## 2017-05-17 ENCOUNTER — Encounter (HOSPITAL_COMMUNITY)
Admission: RE | Disposition: A | Discharge status: Home / Self Care | Payer: Self-pay | Source: Ambulatory Visit | Attending: Internal Medicine

## 2017-05-17 ENCOUNTER — Ambulatory Visit (HOSPITAL_COMMUNITY)
Admission: RE | Admit: 2017-05-17 | Discharge: 2017-05-17 | Disposition: A | Discharge status: Home / Self Care | Payer: Medicare Other | Source: Ambulatory Visit | Attending: Internal Medicine | Admitting: Internal Medicine

## 2017-05-17 DIAGNOSIS — K5909 Other constipation: Secondary | ICD-10-CM | POA: Insufficient documentation

## 2017-05-17 DIAGNOSIS — Z85118 Personal history of other malignant neoplasm of bronchus and lung: Secondary | ICD-10-CM | POA: Diagnosis not present

## 2017-05-17 DIAGNOSIS — Z8546 Personal history of malignant neoplasm of prostate: Secondary | ICD-10-CM | POA: Diagnosis not present

## 2017-05-17 DIAGNOSIS — Z1211 Encounter for screening for malignant neoplasm of colon: Secondary | ICD-10-CM | POA: Insufficient documentation

## 2017-05-17 DIAGNOSIS — J449 Chronic obstructive pulmonary disease, unspecified: Secondary | ICD-10-CM | POA: Insufficient documentation

## 2017-05-17 DIAGNOSIS — K644 Residual hemorrhoidal skin tags: Secondary | ICD-10-CM | POA: Insufficient documentation

## 2017-05-17 DIAGNOSIS — Z7982 Long term (current) use of aspirin: Secondary | ICD-10-CM | POA: Insufficient documentation

## 2017-05-17 DIAGNOSIS — Z09 Encounter for follow-up examination after completed treatment for conditions other than malignant neoplasm: Secondary | ICD-10-CM | POA: Diagnosis not present

## 2017-05-17 DIAGNOSIS — E785 Hyperlipidemia, unspecified: Secondary | ICD-10-CM | POA: Diagnosis not present

## 2017-05-17 DIAGNOSIS — Z860100 Personal history of colon polyps, unspecified: Secondary | ICD-10-CM | POA: Insufficient documentation

## 2017-05-17 DIAGNOSIS — K5520 Angiodysplasia of colon without hemorrhage: Secondary | ICD-10-CM | POA: Insufficient documentation

## 2017-05-17 DIAGNOSIS — Z79899 Other long term (current) drug therapy: Secondary | ICD-10-CM | POA: Insufficient documentation

## 2017-05-17 DIAGNOSIS — D122 Benign neoplasm of ascending colon: Secondary | ICD-10-CM | POA: Diagnosis not present

## 2017-05-17 DIAGNOSIS — M5030 Other cervical disc degeneration, unspecified cervical region: Secondary | ICD-10-CM | POA: Diagnosis not present

## 2017-05-17 DIAGNOSIS — F17210 Nicotine dependence, cigarettes, uncomplicated: Secondary | ICD-10-CM | POA: Insufficient documentation

## 2017-05-17 DIAGNOSIS — I10 Essential (primary) hypertension: Secondary | ICD-10-CM | POA: Insufficient documentation

## 2017-05-17 HISTORY — PX: POLYPECTOMY: SHX5525

## 2017-05-17 HISTORY — PX: COLONOSCOPY WITH PROPOFOL: SHX5780

## 2017-05-17 SURGERY — COLONOSCOPY WITH PROPOFOL
Anesthesia: Monitor Anesthesia Care

## 2017-05-17 MED ORDER — FENTANYL CITRATE (PF) 100 MCG/2ML IJ SOLN
INTRAMUSCULAR | Status: AC
  Filled 2017-05-17: qty 2

## 2017-05-17 MED ORDER — MIDAZOLAM HCL 2 MG/2ML IJ SOLN
1.0000 mg | INTRAMUSCULAR | Status: AC
  Administered 2017-05-17: 2 mg via INTRAVENOUS
  Filled 2017-05-17: qty 2

## 2017-05-17 MED ORDER — FENTANYL CITRATE (PF) 100 MCG/2ML IJ SOLN
25.0000 ug | Freq: Once | INTRAMUSCULAR | Status: AC
  Administered 2017-05-17: 25 ug via INTRAVENOUS

## 2017-05-17 MED ORDER — CHLORHEXIDINE GLUCONATE CLOTH 2 % EX PADS: 6.0000 | MEDICATED_PAD | Freq: Once | CUTANEOUS | Status: DC

## 2017-05-17 MED ORDER — PROPOFOL 500 MG/50ML IV EMUL
INTRAVENOUS | Status: DC | PRN
  Administered 2017-05-17: 125 ug/kg/min via INTRAVENOUS

## 2017-05-17 MED ORDER — LACTATED RINGERS IV SOLN
INTRAVENOUS | Status: DC
  Administered 2017-05-17: 09:00:00 via INTRAVENOUS

## 2017-05-17 MED ORDER — PROPOFOL 10 MG/ML IV BOLUS
INTRAVENOUS | Status: AC
  Filled 2017-05-17: qty 40

## 2017-05-17 MED ORDER — MIDAZOLAM HCL 5 MG/5ML IJ SOLN
INTRAMUSCULAR | Status: DC | PRN
  Administered 2017-05-17: 2 mg via INTRAVENOUS

## 2017-05-17 MED ORDER — MIDAZOLAM HCL 2 MG/2ML IJ SOLN
INTRAMUSCULAR | Status: AC
  Filled 2017-05-17: qty 2

## 2017-05-17 NOTE — Op Note (Signed)
[facility] Patient Name: Eric Hinton Procedure Date: 05/17/2017 9:37 AM MRN: [id number] Date of Birth: 1941/02/10 Attending MD: Hildred Laser , MD CSN: [id number] Age: 77 Admit Type: Outpatient Procedure:                Colonoscopy Indications:              High risk colon cancer surveillance: Personal                            history of colonic polyps Providers:                Hildred Laser, MD, Otis Peak B. Sharon Seller, RN, Randa Spike, Technician Referring MD:              Medicines:                Propofol per Anesthesia Complications:            No immediate complications. Estimated Blood Loss:     Estimated blood loss was minimal. Procedure:                Pre-Anesthesia Assessment:                           - Prior to the procedure, a History and Physical                            was performed, and patient medications and                            allergies were reviewed. The patient's tolerance of                            previous anesthesia was also reviewed. The risks                            and benefits of the procedure and the sedation                            options and risks were discussed with the patient.                            All questions were answered, and informed consent                            was obtained. Prior Anticoagulants: The patient                            last took aspirin 7 days prior to the procedure.                            ASA Grade Assessment: III - A patient with severe  systemic disease. After reviewing the risks and                            benefits, the patient was deemed in satisfactory                            condition to undergo the procedure.                           After obtaining informed consent, the colonoscope                            was passed under direct vision. Throughout the                            procedure, the patient's blood  pressure, pulse, and                            oxygen saturations were monitored continuously. The                            EC-[id number] ([id number]) scope was introduced through                            the and advanced to the the cecum, identified by                            appendiceal orifice and ileocecal valve. The                            colonoscopy was performed without difficulty. The                            patient tolerated the procedure well. The quality                            of the bowel preparation was adequate to identify                            polyps. The ileocecal valve, appendiceal orifice,                            and rectum were photographed. Scope In: 9:59:03 AM Scope Out: 10:16:03 AM Scope Withdrawal Time: 0 hours 12 minutes 26 seconds  Total Procedure Duration: 0 hours 17 minutes 0 seconds  Findings:      The perianal and digital rectal examinations were normal.      Two small angiodysplastic lesions were found in the cecum.      A small polyp was found in the ascending colon. The polyp was sessile.       Biopsies were taken with a cold forceps for histology.      The exam was otherwise normal throughout the examined colon.      External hemorrhoids were found during retroflexion. The hemorrhoids       were small. Impression:               -  Two colonic angiodysplastic lesions.                           - One small polyp in the ascending colon. Biopsied.                           - External hemorrhoids. Moderate Sedation:      Per Anesthesia Care Recommendation:           - Patient has a contact number available for                            emergencies. The signs and symptoms of potential                            delayed complications were discussed with the                            patient. Return to normal activities tomorrow.                            Written discharge instructions were provided to the                             patient.                           - Resume previous diet today.                           - Continue present medications.                           - Resume aspirin at prior dose tomorrow.                           - Await pathology results.                           - Repeat colonoscopy in 5 years for surveillance. Procedure Code(s):        --- Professional ---                           [id number], Colonoscopy, flexible; with biopsy, single                            or multiple Diagnosis Code(s):        --- Professional ---                           Z86.010, Personal history of colonic polyps                           K55.20, Angiodysplasia of colon without hemorrhage                           D12.2, Benign neoplasm of ascending colon  K64.4, Residual hemorrhoidal skin tags CPT copyright 2016 American Medical Association. All rights reserved. The codes documented in this report are preliminary and upon coder review may  be revised to meet current compliance requirements. Hildred Laser, MD Hildred Laser, MD 05/17/2017 10:27:44 AM This report has been signed electronically. Number of Addenda: 0

## 2017-05-17 NOTE — Anesthesia Postprocedure Evaluation (Signed)
Anesthesia Post Note  Patient: Eric Hinton  Procedure(s) Performed: COLONOSCOPY WITH PROPOFOL (N/A ) POLYPECTOMY  Patient location during evaluation: PACU Anesthesia Type: MAC Level of consciousness: awake Pain management: pain level controlled Vital Signs Assessment: post-procedure vital signs reviewed and stable Respiratory status: spontaneous breathing, nonlabored ventilation and respiratory function stable Cardiovascular status: blood pressure returned to baseline Postop Assessment: no apparent nausea or vomiting Anesthetic complications: no     Last Vitals:  Vitals:   05/17/17 0950 05/17/17 1025  BP: 114/69 (!) 135/122  Pulse:  99  Resp: 19 18  Temp:  36.6 C  SpO2: 98% 98%    Last Pain:  Vitals:   05/17/17 0828  TempSrc: Oral                 Celicia Minahan J

## 2017-05-17 NOTE — Discharge Instructions (Signed)
Resume aspirin on 05/18/2017. Resume other medications and diet as before. No driving for 24 hours. Physician will call with biopsy results.   Colonoscopy, Adult, Care After This sheet gives you information about how to care for yourself after your procedure. Your health care provider may also give you more specific instructions. If you have problems or questions, contact your health care provider. What can I expect after the procedure? After the procedure, it is common to have:  A small amount of blood in your stool for 24 hours after the procedure.  Some gas.  Mild abdominal cramping or bloating.  Follow these instructions at home: General instructions   For the first 24 hours after the procedure: ? Do not drive or use machinery. ? Do not sign important documents. ? Do not drink alcohol. ? Do your regular daily activities at a slower pace than normal. ? Eat soft, easy-to-digest foods. ? Rest often.  Take over-the-counter or prescription medicines only as told by your health care provider.  It is up to you to get the results of your procedure. Ask your health care provider, or the department performing the procedure, when your results will be ready. Relieving cramping and bloating  Try walking around when you have cramps or feel bloated.  Apply heat to your abdomen as told by your health care provider. Use a heat source that your health care provider recommends, such as a moist heat pack or a heating pad. ? Place a towel between your skin and the heat source. ? Leave the heat on for 20-30 minutes. ? Remove the heat if your skin turns bright red. This is especially important if you are unable to feel pain, heat, or cold. You may have a greater risk of getting burned. Eating and drinking  Drink enough fluid to keep your urine clear or pale yellow.  Resume your normal diet as instructed by your health care provider. Avoid heavy or fried foods that are hard to digest.  Avoid  drinking alcohol for as long as instructed by your health care provider. Contact a health care provider if:  You have blood in your stool 2-3 days after the procedure. Get help right away if:  You have more than a small spotting of blood in your stool.  You pass large blood clots in your stool.  Your abdomen is swollen.  You have nausea or vomiting.  You have a fever.  You have increasing abdominal pain that is not relieved with medicine. This information is not intended to replace advice given to you by your health care provider. Make sure you discuss any questions you have with your health care provider. Document Released: 10/25/2003 Document Revised: 12/05/2015 Document Reviewed: 05/24/2015 Elsevier Interactive Patient Education  2018 [place] Anesthesia, Adult, Care After These instructions provide you with information about caring for yourself after your procedure. Your health care provider may also give you more specific instructions. Your treatment has been planned according to current medical practices, but problems sometimes occur. Call your health care provider if you have any problems or questions after your procedure. What can I expect after the procedure? After the procedure, it is common to have:  Vomiting.  A sore throat.  Mental slowness.  It is common to feel:  Nauseous.  Cold or shivery.  Sleepy.  Tired.  Sore or achy, even in parts of your body where you did not have surgery.  Follow these instructions at home: For at least 24 hours  after the procedure:  Do not: ? Participate in activities where you could fall or become injured. ? Drive. ? Use heavy machinery. ? Drink alcohol. ? Take sleeping pills or medicines that cause drowsiness. ? Make important decisions or sign legal documents. ? Take care of children on your own.  Rest. Eating and drinking  If you vomit, drink water, juice, or soup when you can drink without  vomiting.  Drink enough fluid to keep your urine clear or pale yellow.  Make sure you have little or no nausea before eating solid foods.  Follow the diet recommended by your health care provider. General instructions  Have a responsible adult stay with you until you are awake and alert.  Return to your normal activities as told by your health care provider. Ask your health care provider what activities are safe for you.  Take over-the-counter and prescription medicines only as told by your health care provider.  If you smoke, do not smoke without supervision.  Keep all follow-up visits as told by your health care provider. This is important. Contact a health care provider if:  You continue to have nausea or vomiting at home, and medicines are not helpful.  You cannot drink fluids or start eating again.  You cannot urinate after 8-12 hours.  You develop a skin rash.  You have fever.  You have increasing redness at the site of your procedure. Get help right away if:  You have difficulty breathing.  You have chest pain.  You have unexpected bleeding.  You feel that you are having a life-threatening or urgent problem. This information is not intended to replace advice given to you by your health care provider. Make sure you discuss any questions you have with your health care provider. Document Released: 06/18/2000 Document Revised: 08/15/2015 Document Reviewed: 02/24/2015 Elsevier Interactive Patient Education  [employer].

## 2017-05-17 NOTE — Anesthesia Preprocedure Evaluation (Signed)
Anesthesia Evaluation  Patient identified by MRN, date of birth, ID band Patient awake    Reviewed: Allergy & Precautions, NPO status , Patient's Chart, lab work & pertinent test results  Airway Mallampati: II  TM Distance: >3 FB Neck ROM: Full    Dental  (+) Teeth Intact, Dental Advisory Given   Pulmonary COPD, Current Smoker,    breath sounds clear to auscultation       Cardiovascular hypertension, Pt. on medications + Peripheral Vascular Disease (bilat carotid endart)   Rhythm:Regular Rate:Normal     Neuro/Psych    GI/Hepatic (+)     substance abuse  alcohol use,   Endo/Other    Renal/GU      Musculoskeletal   Abdominal   Peds  Hematology   Anesthesia Other Findings   Reproductive/Obstetrics                             Anesthesia Physical Anesthesia Plan  ASA: III  Anesthesia Plan: MAC   Post-op Pain Management:    Induction: Intravenous  PONV Risk Score and Plan:   Airway Management Planned: Simple Face Mask  Additional Equipment:   Intra-op Plan:   Post-operative Plan:   Informed Consent: I have reviewed the patients History and Physical, chart, labs and discussed the procedure including the risks, benefits and alternatives for the proposed anesthesia with the patient or authorized representative who has indicated his/her understanding and acceptance.     Plan Discussed with:   Anesthesia Plan Comments:         Anesthesia Quick Evaluation

## 2017-05-17 NOTE — Transfer of Care (Signed)
Immediate Anesthesia Transfer of Care Note  Patient: Eric Hinton  Procedure(s) Performed: COLONOSCOPY WITH PROPOFOL (N/A ) POLYPECTOMY  Patient Location: PACU  Anesthesia Type:MAC  Level of Consciousness: awake and patient cooperative  Airway & Oxygen Therapy: Patient Spontanous Breathing and Patient connected to face mask oxygen  Post-op Assessment: Report given to RN, Post -op Vital signs reviewed and stable and Patient moving all extremities  Post vital signs: Reviewed and stable  Last Vitals:  Vitals:   05/17/17 0945 05/17/17 0950  BP:  114/69  Pulse:    Resp: (!) 26 19  Temp:    SpO2: 97% 98%    Last Pain:  Vitals:   05/17/17 0828  TempSrc: Oral         Complications: No apparent anesthesia complications

## 2017-05-17 NOTE — H&P (Addendum)
Eric Hinton is an 77 y.o. male.   Chief Complaint: Patient is here for colonoscopy. HPI: Patient is 77 year old Caucasian male who has a history of colonic adenomas and is here for surveillance colonoscopy.  Last exam was in August 2013.  On 1 of his prior colonoscopies he had a large adenoma removed from his colon.  He has chronic constipation.  He takes stool softener and Dulcolax on as-needed basis.  He denies abdominal pain melena or rectal bleeding.  She states he does not do much walking because of impaired vision due to macular degeneration and glaucoma. Family history is negative for CRC. Last aspirin dose was 1 week ago.  Past Medical History:  Diagnosis Date  . Arthritis   . Carotid artery disease ([place])    Status post bilateral CEA - Dr. Donnetta Hutching  . Constipation   . COPD (chronic obstructive pulmonary disease) ([place])   . DDD (degenerative disc disease), cervical   . Degenerative disc disease, lumbar   . Essential hypertension   . Hyperlipidemia   . Macular degeneration   . Prostate cancer ([place])   . Skin cancer, basal cell   . Squamous cell lung cancer ([place]) 07/15/2015   Status post chemotherapy and XRT - Dr. Julien Nordmann  . Tubular adenoma of colon     Past Surgical History:  Procedure Laterality Date  . CATARACT EXTRACTION W/ INTRAOCULAR LENS  IMPLANT, BILATERAL Bilateral   . COLONOSCOPY    . ENDARTERECTOMY Right 08/29/2015   Procedure: RIGHT CAROTID ENDARTERECTOMY WITH PATCH ANGIOPLASTY;  Surgeon: Rosetta Posner, MD;  Location: [facility];  Service: Vascular;  Laterality: Right;  . ENDARTERECTOMY Left 10/07/2015   Procedure: LEFT CAROTID ARTERY ENDARTERECTOMY;  Surgeon: Rosetta Posner, MD;  Location: [facility];  Service: Vascular;  Laterality: Left;  . NOSE SURGERY     "rebulit my nose; related to skin cancer"  . PATCH ANGIOPLASTY Left 10/07/2015   Procedure: With [place];  Surgeon: Rosetta Posner, MD;  Location: [facility];  Service: Vascular;  Laterality: Left;   . PROSTATE BIOPSY    . SKIN CANCER EXCISION Right    neck  . TONSILLECTOMY      Family History  Problem Relation Age of Onset  . Cancer Mother   . Stroke Father   . Heart disease Father   . Diabetes Mellitus II Sister    Social History:  reports that he has been smoking cigarettes.  He started smoking about 71 years ago. He has a 103.50 pack-year smoking history. he has never used smokeless tobacco. He reports that he drinks about 21.0 oz of alcohol per week. He reports that he does not use drugs.  Allergies:  Allergies  Allergen Reactions  . Wellbutrin [Bupropion] Hives    Medications Prior to Admission  Medication Sig Dispense Refill  . aspirin 325 MG tablet Take 325 mg by mouth daily.    Marland Kitchen atorvastatin (LIPITOR) 10 MG tablet Take 10 mg by mouth daily.    . Cyanocobalamin (VITAMIN B-12) 2500 MCG SUBL Place 2,500 mcg under the tongue daily.    Marland Kitchen FLUoxetine (PROZAC) 20 MG capsule Take 20 mg by mouth daily.    Marland Kitchen latanoprost (XALATAN) 0.005 % ophthalmic solution Place 1 drop into both eyes at bedtime.    Marland Kitchen losartan (COZAAR) 50 MG tablet Take 50 mg by mouth daily.  1  . menthol-zinc oxide (GOLD BOND) powder Apply 1 application topically daily as needed (for itching).    . Multiple  Vitamins-Minerals (PRESERVISION AREDS 2) CAPS Take 1 capsule by mouth 2 (two) times daily.     . naproxen sodium (ANAPROX) 220 MG tablet Take 440 mg by mouth 2 (two) times daily as needed (for pain).    . polyethylene glycol-electrolytes (NULYTELY/GOLYTELY) 420 g solution Take 4,000 mLs by mouth once.  0  . tamsulosin (FLOMAX) 0.4 MG CAPS capsule TAKE 1 CAPSULE BY MOUTH DAILY 30 capsule 0    No results found for this or any previous visit (from the past 48 hour(s)). No results found.  ROS  Blood pressure 116/67, pulse 96, temperature 98.5 F (36.9 C), temperature source Oral, resp. rate 16, SpO2 95 %. Physical Exam  Constitutional: He appears well-developed and well-nourished.  HENT:   Mouth/Throat: Oropharynx is clear and moist.  Eyes: Conjunctivae are normal. No scleral icterus.  Neck: No thyromegaly present.  Cardiovascular: Normal rate, regular rhythm and normal heart sounds.  No murmur heard. Respiratory: Effort normal and breath sounds normal.  GI: Soft. He exhibits no distension and no mass. There is no tenderness.  Musculoskeletal: He exhibits no edema.  Lymphadenopathy:    He has no cervical adenopathy.  Neurological: He is alert.  Skin: Skin is warm and dry.     Assessment/Plan History of colonic adenomas. Surveillance colonoscopy.  Eric Laser, MD 05/17/2017, 9:11 AM

## 2017-05-20 ENCOUNTER — Encounter (HOSPITAL_COMMUNITY): Payer: Self-pay | Admitting: Internal Medicine

## 2017-05-20 DIAGNOSIS — I10 Essential (primary) hypertension: Secondary | ICD-10-CM | POA: Diagnosis not present

## 2017-05-20 DIAGNOSIS — I2510 Atherosclerotic heart disease of native coronary artery without angina pectoris: Secondary | ICD-10-CM | POA: Diagnosis not present

## 2017-05-20 DIAGNOSIS — Z6831 Body mass index (BMI) 31.0-31.9, adult: Secondary | ICD-10-CM | POA: Diagnosis not present

## 2017-05-20 DIAGNOSIS — Z86010 Personal history of colonic polyps: Secondary | ICD-10-CM | POA: Diagnosis not present

## 2017-05-20 DIAGNOSIS — R944 Abnormal results of kidney function studies: Secondary | ICD-10-CM | POA: Diagnosis not present

## 2017-05-24 DIAGNOSIS — H353221 Exudative age-related macular degeneration, left eye, with active choroidal neovascularization: Secondary | ICD-10-CM | POA: Diagnosis not present

## 2017-05-24 DIAGNOSIS — H353231 Exudative age-related macular degeneration, bilateral, with active choroidal neovascularization: Secondary | ICD-10-CM | POA: Diagnosis not present

## 2017-06-03 DIAGNOSIS — Z6831 Body mass index (BMI) 31.0-31.9, adult: Secondary | ICD-10-CM | POA: Diagnosis not present

## 2017-06-03 DIAGNOSIS — F411 Generalized anxiety disorder: Secondary | ICD-10-CM | POA: Diagnosis not present

## 2017-06-03 DIAGNOSIS — Z720 Tobacco use: Secondary | ICD-10-CM | POA: Diagnosis not present

## 2017-06-03 DIAGNOSIS — D126 Benign neoplasm of colon, unspecified: Secondary | ICD-10-CM | POA: Diagnosis not present

## 2017-06-03 DIAGNOSIS — I10 Essential (primary) hypertension: Secondary | ICD-10-CM | POA: Diagnosis not present

## 2017-06-03 DIAGNOSIS — E875 Hyperkalemia: Secondary | ICD-10-CM | POA: Diagnosis not present

## 2017-06-03 DIAGNOSIS — R972 Elevated prostate specific antigen [PSA]: Secondary | ICD-10-CM | POA: Diagnosis not present

## 2017-06-03 DIAGNOSIS — R944 Abnormal results of kidney function studies: Secondary | ICD-10-CM | POA: Diagnosis not present

## 2017-06-03 DIAGNOSIS — G453 Amaurosis fugax: Secondary | ICD-10-CM | POA: Diagnosis not present

## 2017-06-03 DIAGNOSIS — D0220 Carcinoma in situ of unspecified bronchus and lung: Secondary | ICD-10-CM | POA: Diagnosis not present

## 2017-06-03 DIAGNOSIS — Z85828 Personal history of other malignant neoplasm of skin: Secondary | ICD-10-CM | POA: Diagnosis not present

## 2017-06-03 DIAGNOSIS — C61 Malignant neoplasm of prostate: Secondary | ICD-10-CM | POA: Diagnosis not present

## 2017-06-15 IMAGING — CT CT CHEST W/ CM
1 of 2 series · 14 of 32 positions shown, 18 images · IV contrast (ISOVUE)
Comparison: 12/02/2009.

CLINICAL DATA: Squamous cell carcinoma of the right lung with
history of chemotherapy and radiation therapy.

EXAM:
CT CHEST WITH CONTRAST
TECHNIQUE: Multidetector CT imaging of the chest was performed during
intravenous contrast administration.
CONTRAST:  100 cc Isovue 370.

[Series 16: routine chest w without · axial · non-contrast · 0.71mm/px · z∈[-200,+86]mm · 14 of 171 slices shown, 18 images]
[im 14/171  mediastinal]
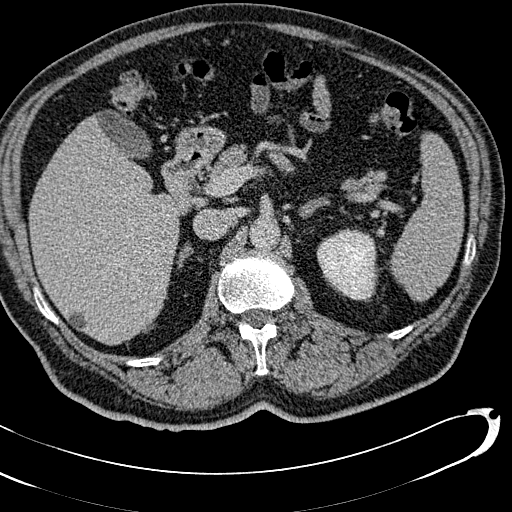
[im 14/171  lung]
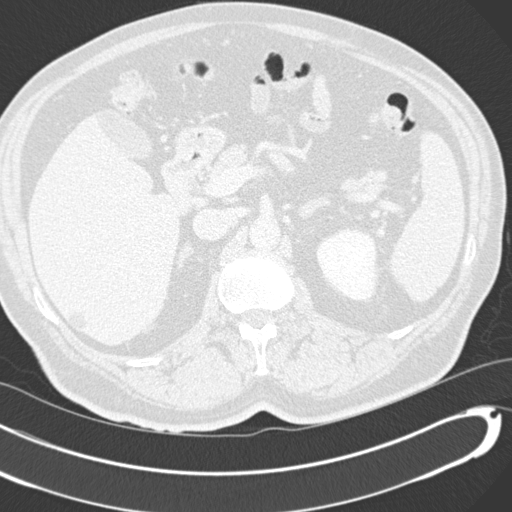
[im 27/171  lung]
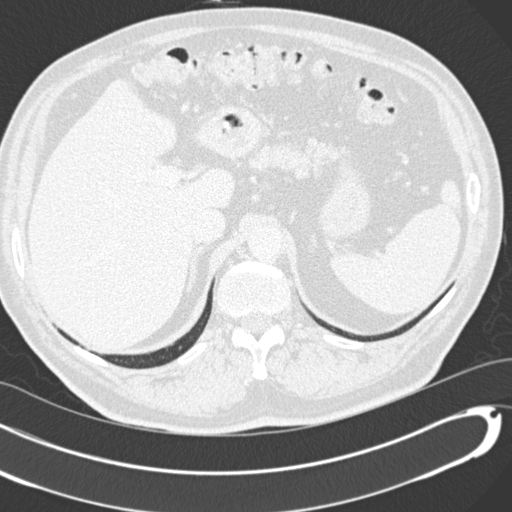
[im 40/171  lung]
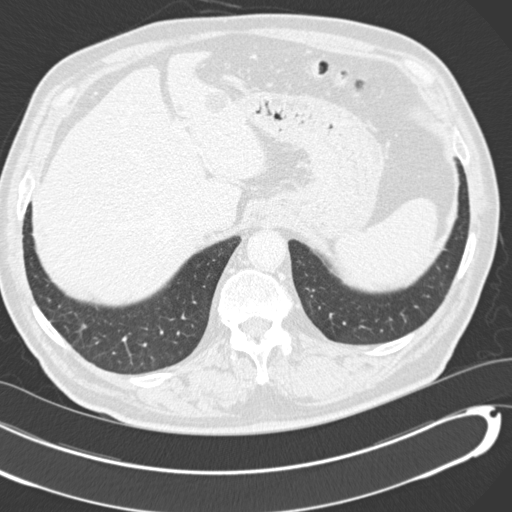
[im 53/171  lung]
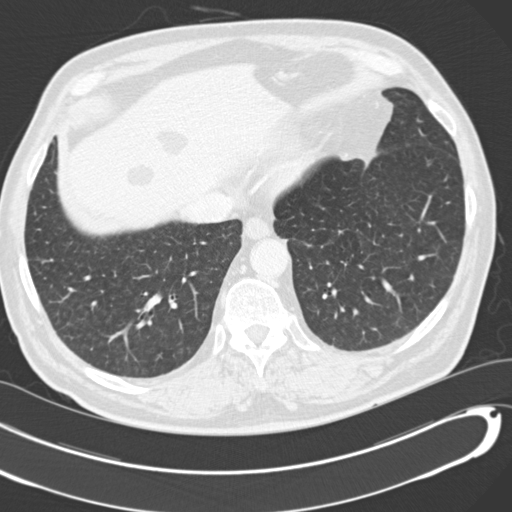
[im 66/171  mediastinal]
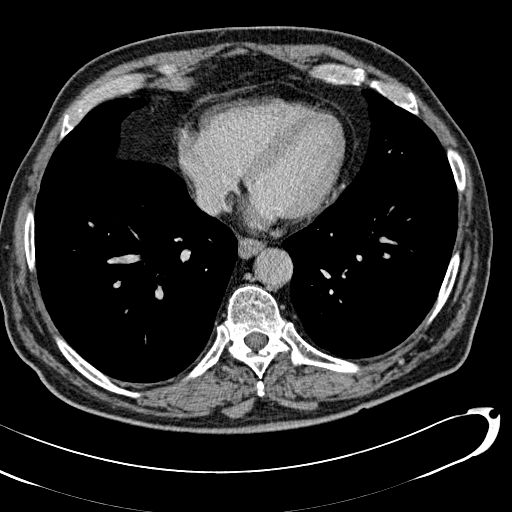
[im 66/171  lung]
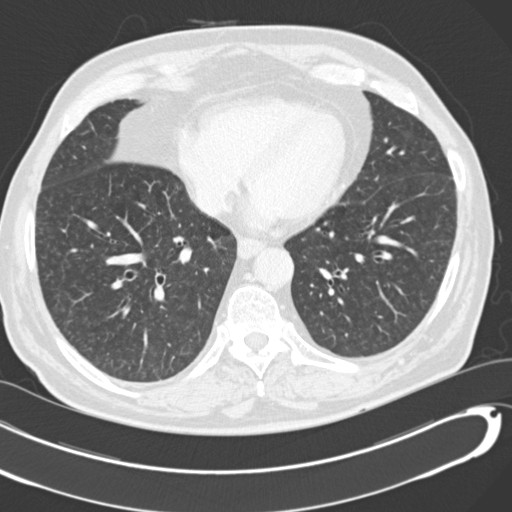
[im 79/171  lung]
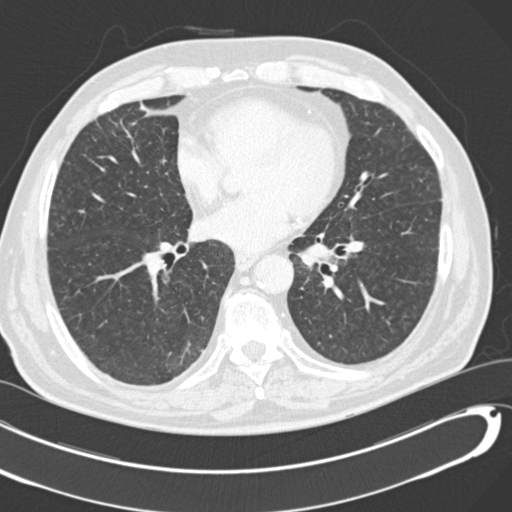
[im 81/171  lung]
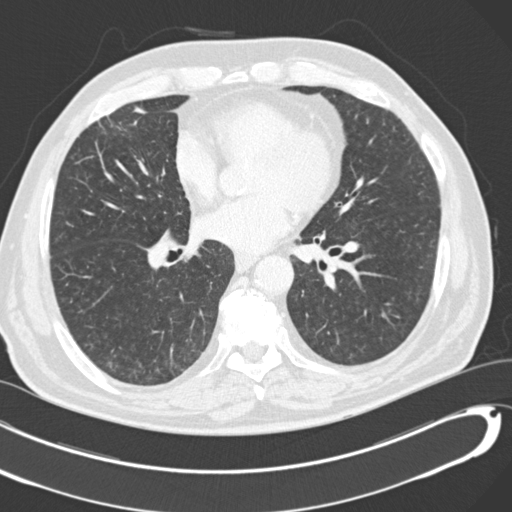
[im 86/171  lung]
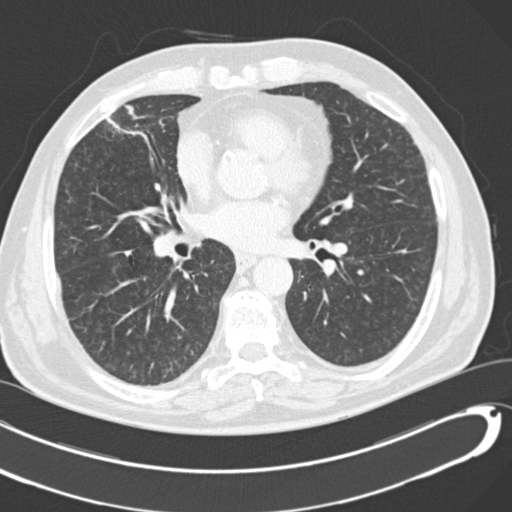
[im 92/171  mediastinal]
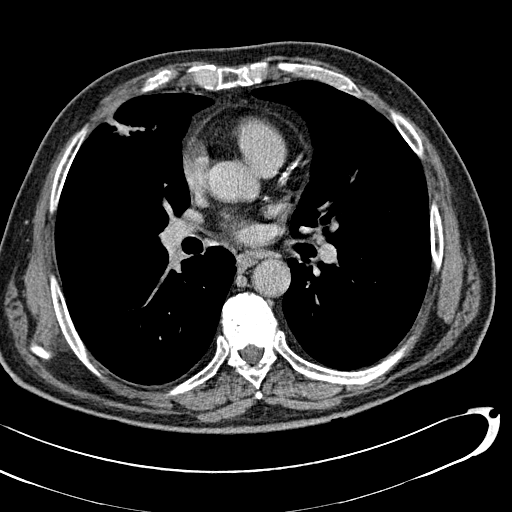
[im 92/171  lung]
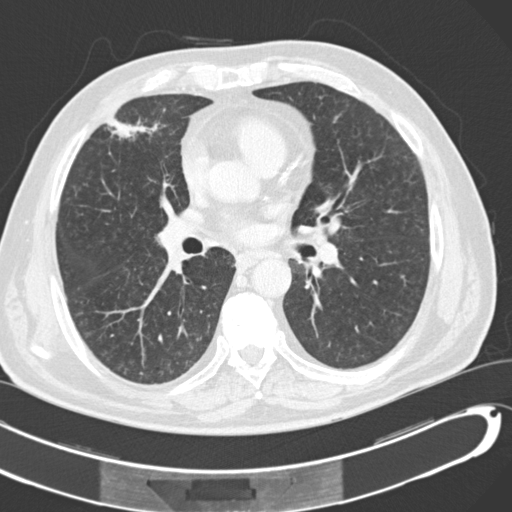
[im 105/171  lung]
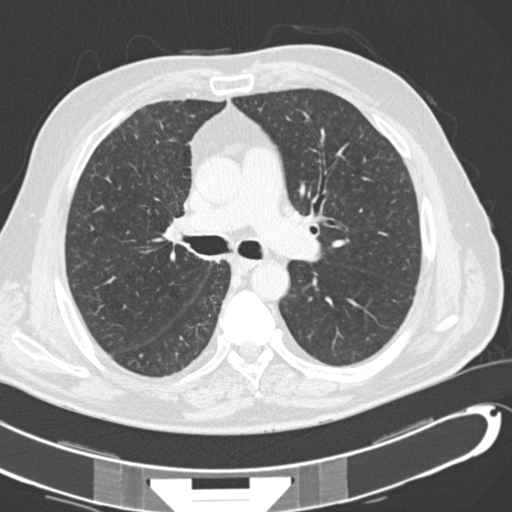
[im 118/171  lung]
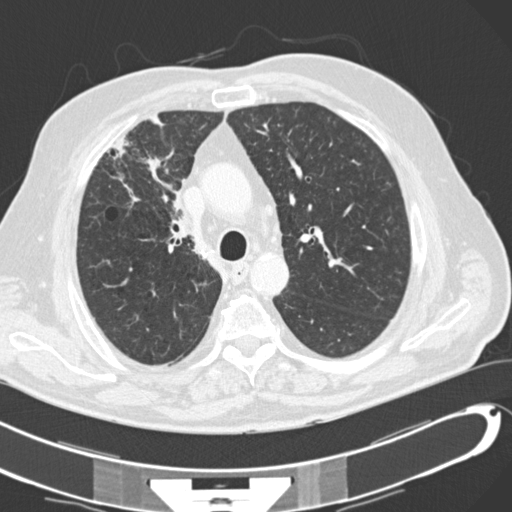
[im 131/171  lung]
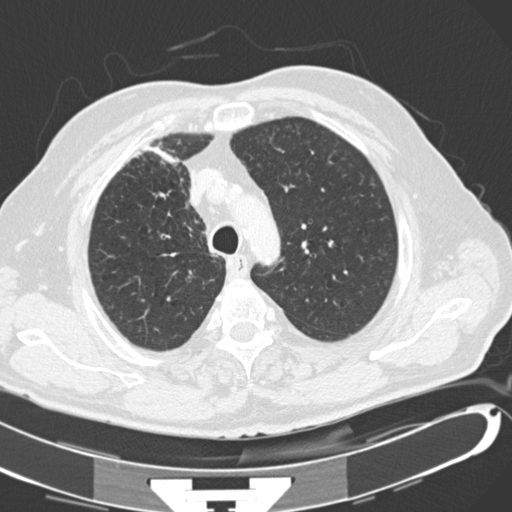
[im 144/171  mediastinal]
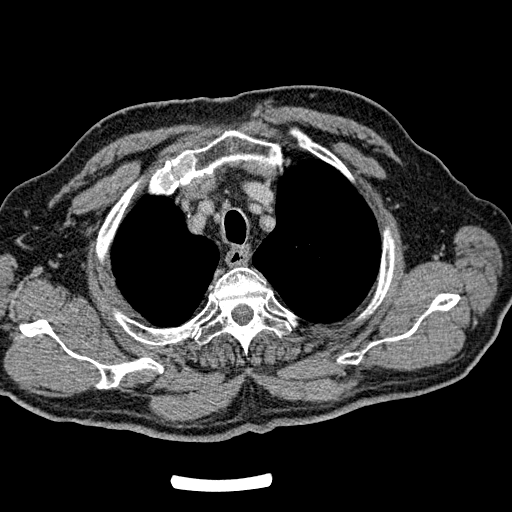
[im 144/171  lung]
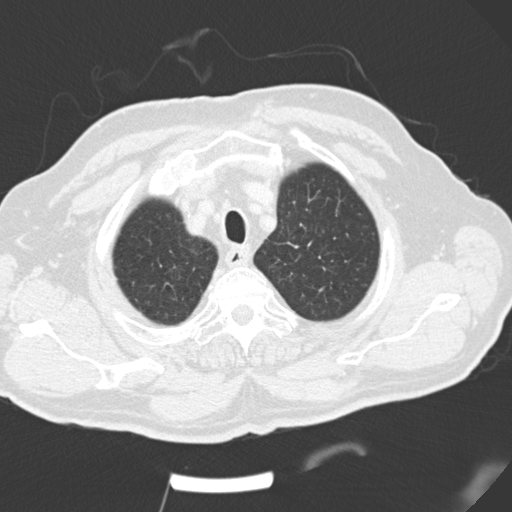
[im 157/171  lung]
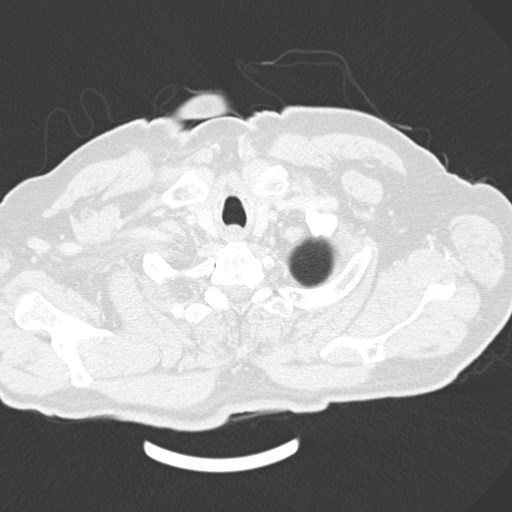

[14 of 32 positions shown; findings below may reference images not displayed]

FINDINGS: Mediastinum/Nodes: Mediastinal lymph nodes are not enlarged by CT
size criteria. Hilar lymph nodes measure up to 9 mm on the right. No
axillary adenopathy. Atherosclerotic calcification of the arterial
vasculature, including three-vessel involvement of the coronary
arteries. Heart size normal. No pericardial effusion.

Lungs/Pleura: Mild centrilobular emphysema. Diffuse
peribronchovascular nodularity, as on 12/02/2009. Mild
bronchiectasis, interstitial thickening, subpleural consolidation
and mild architectural distortion are seen in the anterior segment
right upper lobe. An additional small area of subpleural
consolidation is seen in the inferior aspect of the anterior segment
right upper lobe, extending along the minor fissure, into the right
middle lobe, at the site of previously seen nodularity on
12/02/2009. Associated mild architectural distortion and
bronchiectasis. A few scattered tiny pulmonary nodules measure up to
4 mm in the right lower lobe (series 17, image 99), not well seen on
the prior exam. No pleural fluid. Airway is unremarkable.

Upper abdomen: Low-attenuation lesions in the liver measure up to
1.7 cm, as before, likely cysts. Visualized portions of the liver,
gallbladder, adrenal glands, kidneys, spleen, pancreas, stomach and
bowel are otherwise grossly unremarkable. No upper abdominal
adenopathy.

Musculoskeletal: No worrisome lytic or sclerotic lesions.
Degenerative changes are seen in the spine.
IMPRESSION: 1. Areas of architectural distortion, bronchiectasis and
consolidation in the right upper and right middle lobes are
presumably treatment related. Given absence of a recent prior
comparison examination, it is difficult to definitively exclude
residual disease. This examination will serve as baseline for future
comparison.
2. Small right lower lobe pulmonary nodules, not well seen on the
prior exam. Continued attention on followup exams is warranted.
3. Three-vessel coronary artery calcification.

## 2017-07-16 DIAGNOSIS — H35033 Hypertensive retinopathy, bilateral: Secondary | ICD-10-CM | POA: Diagnosis not present

## 2017-07-16 DIAGNOSIS — H353211 Exudative age-related macular degeneration, right eye, with active choroidal neovascularization: Secondary | ICD-10-CM | POA: Diagnosis not present

## 2017-07-16 DIAGNOSIS — H353221 Exudative age-related macular degeneration, left eye, with active choroidal neovascularization: Secondary | ICD-10-CM | POA: Diagnosis not present

## 2017-08-05 ENCOUNTER — Other Ambulatory Visit: Payer: Self-pay | Admitting: Medical Oncology

## 2017-08-05 ENCOUNTER — Telehealth: Payer: Self-pay | Admitting: Medical Oncology

## 2017-08-05 DIAGNOSIS — C3491 Malignant neoplasm of unspecified part of right bronchus or lung: Secondary | ICD-10-CM

## 2017-08-05 NOTE — Telephone Encounter (Signed)
When is appt for CT scan ? I gave her phone number for CS.

## 2017-08-08 ENCOUNTER — Other Ambulatory Visit: Payer: Medicare Other

## 2017-08-08 ENCOUNTER — Ambulatory Visit (HOSPITAL_COMMUNITY)
Admission: RE | Admit: 2017-08-08 | Discharge: 2017-08-08 | Disposition: A | Discharge status: Home / Self Care | Payer: Medicare Other | Source: Ambulatory Visit | Attending: Internal Medicine | Admitting: Internal Medicine

## 2017-08-08 ENCOUNTER — Other Ambulatory Visit (HOSPITAL_COMMUNITY)
Admission: RE | Admit: 2017-08-08 | Discharge: 2017-08-08 | Disposition: A | Discharge status: Home / Self Care | Payer: Medicare Other | Source: Ambulatory Visit | Attending: Internal Medicine | Admitting: Internal Medicine

## 2017-08-08 DIAGNOSIS — J439 Emphysema, unspecified: Secondary | ICD-10-CM | POA: Diagnosis not present

## 2017-08-08 DIAGNOSIS — J438 Other emphysema: Secondary | ICD-10-CM | POA: Diagnosis not present

## 2017-08-08 DIAGNOSIS — C3491 Malignant neoplasm of unspecified part of right bronchus or lung: Secondary | ICD-10-CM | POA: Diagnosis not present

## 2017-08-08 DIAGNOSIS — I700 Atherosclerosis of aorta: Secondary | ICD-10-CM | POA: Diagnosis not present

## 2017-08-08 DIAGNOSIS — J432 Centrilobular emphysema: Secondary | ICD-10-CM | POA: Diagnosis not present

## 2017-08-08 LAB — CBC WITH DIFFERENTIAL/PLATELET
BASOS ABS: 0 10*3/uL (ref 0.0–0.1)
BASOS PCT: 1 %
Eosinophils Absolute: 0.1 10*3/uL (ref 0.0–0.7)
Eosinophils Relative: 3 %
HEMATOCRIT: 40.9 % (ref 39.0–52.0)
Hemoglobin: 13.7 g/dL (ref 13.0–17.0)
Lymphocytes Relative: 27 %
Lymphs Abs: 1.5 10*3/uL (ref 0.7–4.0)
MCH: 32.5 pg (ref 26.0–34.0)
MCHC: 33.5 g/dL (ref 30.0–36.0)
MCV: 96.9 fL (ref 78.0–100.0)
Monocytes Absolute: 0.4 10*3/uL (ref 0.1–1.0)
Monocytes Relative: 8 %
NEUTROS ABS: 3.3 10*3/uL (ref 1.7–7.7)
NEUTROS PCT: 61 %
Platelets: 181 10*3/uL (ref 150–400)
RBC: 4.22 MIL/uL (ref 4.22–5.81)
RDW: 12.9 % (ref 11.5–15.5)
WBC: 5.4 10*3/uL (ref 4.0–10.5)

## 2017-08-08 LAB — COMPREHENSIVE METABOLIC PANEL
ALBUMIN: 4.1 g/dL (ref 3.5–5.0)
ALK PHOS: 58 U/L (ref 38–126)
ALT: 26 U/L (ref 17–63)
AST: 19 U/L (ref 15–41)
Anion gap: 10 (ref 5–15)
BILIRUBIN TOTAL: 0.9 mg/dL (ref 0.3–1.2)
BUN: 19 mg/dL (ref 6–20)
CO2: 25 mmol/L (ref 22–32)
Calcium: 9.5 mg/dL (ref 8.9–10.3)
Chloride: 105 mmol/L (ref 101–111)
Creatinine, Ser: 1.01 mg/dL (ref 0.61–1.24)
GFR calc Af Amer: >60 mL/min (ref >60)
GFR calc non Af Amer: >60 mL/min (ref >60)
GLUCOSE: 94 mg/dL (ref 65–99)
POTASSIUM: 4.1 mmol/L (ref 3.5–5.1)
Sodium: 140 mmol/L (ref 135–145)
TOTAL PROTEIN: 7.2 g/dL (ref 6.5–8.1)

## 2017-08-08 LAB — POCT I-STAT CREATININE: Creatinine, Ser: 1.1 mg/dL (ref 0.61–1.24)

## 2017-08-08 MED ORDER — IOPAMIDOL (ISOVUE-300) INJECTION 61%
75.0000 mL | Freq: Once | INTRAVENOUS | Status: AC | PRN
  Administered 2017-08-08: 100 mL via INTRAVENOUS

## 2017-08-12 ENCOUNTER — Encounter: Payer: Self-pay | Admitting: Internal Medicine

## 2017-08-12 ENCOUNTER — Inpatient Hospital Stay: Payer: Medicare Other | Attending: Internal Medicine | Admitting: Internal Medicine

## 2017-08-12 ENCOUNTER — Telehealth: Payer: Self-pay | Admitting: Internal Medicine

## 2017-08-12 DIAGNOSIS — C3490 Malignant neoplasm of unspecified part of unspecified bronchus or lung: Secondary | ICD-10-CM

## 2017-08-12 DIAGNOSIS — Z85118 Personal history of other malignant neoplasm of bronchus and lung: Secondary | ICD-10-CM

## 2017-08-12 NOTE — Progress Notes (Signed)
Eric Hinton [phone]   [phone]  OFFICE PROGRESS NOTE  Eric Squibb, MD [place] [address]  DIAGNOSIS: Clinically stage IIIA (T1a, N2, M0) non-small cell lung cancer, poorly differentiated squamous cell carcinoma diagnosed in November 2013   PRIOR THERAPY: status post course of concurrent chemoradiation with weekly carboplatin and paclitaxel when the patient was in [place].   CURRENT THERAPY: Observation.  INTERVAL HISTORY: Eric Hinton 77 y.o. male returns to the clinic today for six-month follow-up visit accompanied by his wife.  The patient is feeling fine today with no specific complaints.  He denied having any chest pain, shortness of breath, cough or hemoptysis.  He denied having any recent weight loss or night sweats.  He has no nausea, vomiting, diarrhea or constipation.  He denied having any headache or visual changes.  The patient had repeat CT scan of the chest performed recently and he is here for evaluation and discussion of his discuss results.   MEDICAL HISTORY: Past Medical History:  Diagnosis Date  . Arthritis   . Carotid artery disease ([place])    Status post bilateral CEA - Eric Hinton  . Constipation   . COPD (chronic obstructive pulmonary disease) ([place])   . DDD (degenerative disc disease), cervical   . Degenerative disc disease, lumbar   . Essential hypertension   . Hyperlipidemia   . Macular degeneration   . Prostate cancer ([place])   . Skin cancer, basal cell   . Squamous cell lung cancer ([place]) 07/15/2015   Status post chemotherapy and XRT - Dr. Julien Nordmann  . Tubular adenoma of colon     ALLERGIES:  is allergic to wellbutrin [bupropion].  MEDICATIONS:  Current Outpatient Medications  Medication Sig Dispense Refill  . aspirin 325 MG tablet Take 1 tablet (325 mg total) by mouth daily.    Marland Kitchen atorvastatin (LIPITOR) 10 MG tablet Take 10 mg by mouth daily.    . bevacizumab (AVASTIN) 400 MG/16ML  SOLN 1.25 mg every 8 (eight) weeks. Left eye    . Cyanocobalamin (VITAMIN B-12) 2500 MCG SUBL Place 2,500 mcg under the tongue daily.    Marland Kitchen FLUoxetine (PROZAC) 20 MG capsule Take 20 mg by mouth daily.    Marland Kitchen latanoprost (XALATAN) 0.005 % ophthalmic solution Place 1 drop into both eyes at bedtime.    Marland Kitchen menthol-zinc oxide (GOLD BOND) powder Apply 1 application topically daily as needed (for itching).    . Multiple Vitamins-Minerals (PRESERVISION AREDS 2) CAPS Take 1 capsule by mouth 2 (two) times daily.     . naproxen sodium (ANAPROX) 220 MG tablet Take 440 mg by mouth 2 (two) times daily as needed (for pain).    . tamsulosin (FLOMAX) 0.4 MG CAPS capsule TAKE 1 CAPSULE BY MOUTH DAILY 30 capsule 0  . telmisartan (MICARDIS) 80 MG tablet Take 80 mg by mouth daily.     No current facility-administered medications for this visit.     SURGICAL HISTORY:  Past Surgical History:  Procedure Laterality Date  . CATARACT EXTRACTION W/ INTRAOCULAR LENS  IMPLANT, BILATERAL Bilateral   . COLONOSCOPY    . COLONOSCOPY WITH PROPOFOL N/A 05/17/2017   Procedure: COLONOSCOPY WITH PROPOFOL;  Surgeon: Eric Houston, MD;  [facility];  Service: Endoscopy;  Laterality: N/A;  10:15  . ENDARTERECTOMY Right 08/29/2015   Procedure: RIGHT CAROTID ENDARTERECTOMY WITH PATCH ANGIOPLASTY;  Surgeon: Eric Posner, MD;  Location: [facility];  Service: Vascular;  Laterality: Right;  . ENDARTERECTOMY Left 10/07/2015   Procedure: LEFT CAROTID ARTERY ENDARTERECTOMY;  Surgeon: Eric Posner, MD;  Location: [facility];  Service: Vascular;  Laterality: Left;  . NOSE SURGERY     "rebulit my nose; related to skin cancer"  . PATCH ANGIOPLASTY Left 10/07/2015   Procedure: With [place];  Surgeon: Eric Posner, MD;  Location: [facility];  Service: Vascular;  Laterality: Left;  . POLYPECTOMY  05/17/2017   Procedure: POLYPECTOMY;  Surgeon: Eric Houston, MD;  [facility];  Service: Endoscopy;;  colon   .  PROSTATE BIOPSY    . SKIN CANCER EXCISION Right    neck  . TONSILLECTOMY      REVIEW OF SYSTEMS:  A comprehensive review of systems was negative.   PHYSICAL EXAMINATION: General appearance: alert, cooperative and no distress Head: Normocephalic, without obvious abnormality, atraumatic Neck: no adenopathy, no JVD, supple, symmetrical, trachea midline and thyroid not enlarged, symmetric, no tenderness/mass/nodules Lymph nodes: Cervical, supraclavicular, and axillary nodes normal. Resp: clear to auscultation bilaterally Back: symmetric, no curvature. ROM normal. No CVA tenderness. Cardio: regular rate and rhythm, S1, S2 normal, no murmur, click, rub or gallop GI: soft, non-tender; bowel sounds normal; no masses,  no organomegaly Extremities: extremities normal, atraumatic, no cyanosis or edema  ECOG PERFORMANCE STATUS: 1 - Symptomatic but completely ambulatory  Blood pressure (!) 141/68, pulse 89, temperature 98.1 F (36.7 C), temperature source Oral, resp. rate 18, height 5\' 6"  (1.676 m), weight 200 lb (90.7 kg), SpO2 98 %.  LABORATORY DATA: Lab Results  Component Value Date   WBC 5.4 08/08/2017   HGB 13.7 08/08/2017   HCT 40.9 08/08/2017   MCV 96.9 08/08/2017   PLT 181 08/08/2017      Chemistry      Component Value Date/Time   NA 140 08/08/2017 1410   NA 139 08/03/2016 1029   K 4.1 08/08/2017 1410   K 4.8 08/03/2016 1029   CL 105 08/08/2017 1410   CO2 25 08/08/2017 1410   CO2 23 08/03/2016 1029   BUN 19 08/08/2017 1410   BUN 19.4 08/03/2016 1029   CREATININE 1.01 08/08/2017 1410   CREATININE 1.1 08/03/2016 1029      Component Value Date/Time   CALCIUM 9.5 08/08/2017 1410   CALCIUM 9.8 08/03/2016 1029   ALKPHOS 58 08/08/2017 1410   ALKPHOS 67 08/03/2016 1029   AST 19 08/08/2017 1410   AST 17 08/03/2016 1029   ALT 26 08/08/2017 1410   ALT 24 08/03/2016 1029   BILITOT 0.9 08/08/2017 1410   BILITOT 0.65 08/03/2016 1029       RADIOGRAPHIC STUDIES: Ct Chest W  Contrast  Result Date: 08/09/2017 CLINICAL DATA:  Patient with history of lung cancer. Follow-up evaluation. EXAM: CT CHEST WITH CONTRAST TECHNIQUE: Multidetector CT imaging of the chest was performed during intravenous contrast administration. CONTRAST:  181mL ISOVUE-300 IOPAMIDOL (ISOVUE-300) INJECTION 61% COMPARISON:  Chest CT 02/08/2017. FINDINGS: Cardiovascular: Normal heart size. Trace fluid superior pericardial recess. Thoracic aortic and coronary arterial vascular calcifications. Mediastinum/Nodes: Similar-appearing 6 mm subcarinal lymph node. Stable subcentimeter AP window lymph nodes. Similar-appearing 1.2 cm right hilar lymph node (image 69; series 2). Lungs/Pleura: Similar-appearing postradiation changes anterior right upper lobe. Additional suspected postradiation changes within the inferior right upper lobe (image 78; series 4) are stable when compared to prior exam. Centrilobular and paraseptal emphysematous change. 4 mm nodule superior segment left lower lobe (image 57; series 4), unchanged. 4 mm right lower lobe  nodule (image 98; series 4), unchanged. Additional small pulmonary nodules bilaterally are similar when compared to prior exam. No pleural effusion or pneumothorax. Upper Abdomen: Liver is normal in size and contour. Re demonstrated cyst within the liver. No acute process. Musculoskeletal: Thoracic spine degenerative changes. No aggressive or acute appearing osseous lesions. IMPRESSION: 1. Stable post radiation changes right upper lobe. 2. No findings suspicious for recurrent or metastatic disease. 3. Aortic Atherosclerosis (ICD10-I70.0) and Emphysema (ICD10-J43.9). Electronically Signed   By: Lovey Newcomer M.D.   On: 08/09/2017 11:28    ASSESSMENT AND PLAN:  This is a very pleasant 77 years old white male with history of stage IIIa non-small cell lung cancer diagnosed in November 2013 status post course of concurrent chemoradiation in [place].  He is currently on observation and  feeling well. The recent CT scan of the chest showed no concerning findings for disease recurrence or progression.  I discussed the scan results with the patient and his wife and recommended for him to continue in observation with repeat CT scan of the chest in 6 months. He was advised to call immediately if he has any concerning symptoms in the interval. The patient voices understanding of current disease status and treatment options and is in agreement with the current care plan. All questions were answered. The patient knows to call the clinic with any problems, questions or concerns. We can certainly see the patient much sooner if necessary. I spent 10 minutes counseling the patient face to face. The total time spent in the appointment was 15 minutes.  Disclaimer: This note was dictated with voice recognition software. Similar sounding words can inadvertently be transcribed and may not be corrected upon review.

## 2017-08-12 NOTE — Telephone Encounter (Signed)
Scheduled appt per 5/20 los - per patient request - push appt out one year due to medicare possibly not paying for ct scan . Scan and labs to be done at [facility] per patient request - message sent to MM

## 2017-09-17 DIAGNOSIS — H35033 Hypertensive retinopathy, bilateral: Secondary | ICD-10-CM | POA: Diagnosis not present

## 2017-09-17 DIAGNOSIS — H353221 Exudative age-related macular degeneration, left eye, with active choroidal neovascularization: Secondary | ICD-10-CM | POA: Diagnosis not present

## 2017-09-17 DIAGNOSIS — H353231 Exudative age-related macular degeneration, bilateral, with active choroidal neovascularization: Secondary | ICD-10-CM | POA: Diagnosis not present

## 2017-09-18 DIAGNOSIS — M48061 Spinal stenosis, lumbar region without neurogenic claudication: Secondary | ICD-10-CM | POA: Diagnosis not present

## 2017-09-18 DIAGNOSIS — M545 Low back pain, unspecified: Secondary | ICD-10-CM | POA: Insufficient documentation

## 2017-09-19 DIAGNOSIS — Z85828 Personal history of other malignant neoplasm of skin: Secondary | ICD-10-CM | POA: Diagnosis not present

## 2017-09-19 DIAGNOSIS — D225 Melanocytic nevi of trunk: Secondary | ICD-10-CM | POA: Diagnosis not present

## 2017-09-19 DIAGNOSIS — C4441 Basal cell carcinoma of skin of scalp and neck: Secondary | ICD-10-CM | POA: Diagnosis not present

## 2017-09-19 DIAGNOSIS — L570 Actinic keratosis: Secondary | ICD-10-CM | POA: Diagnosis not present

## 2017-09-19 DIAGNOSIS — X32XXXD Exposure to sunlight, subsequent encounter: Secondary | ICD-10-CM | POA: Diagnosis not present

## 2017-09-19 DIAGNOSIS — Z08 Encounter for follow-up examination after completed treatment for malignant neoplasm: Secondary | ICD-10-CM | POA: Diagnosis not present

## 2017-10-08 DIAGNOSIS — M5136 Other intervertebral disc degeneration, lumbar region: Secondary | ICD-10-CM | POA: Diagnosis not present

## 2017-10-10 ENCOUNTER — Encounter (HOSPITAL_COMMUNITY): Payer: Self-pay | Admitting: Physical Therapy

## 2017-10-10 ENCOUNTER — Other Ambulatory Visit: Payer: Self-pay

## 2017-10-10 ENCOUNTER — Ambulatory Visit (HOSPITAL_COMMUNITY): Payer: Medicare Other | Attending: Orthopedic Surgery | Admitting: Physical Therapy

## 2017-10-10 DIAGNOSIS — R262 Difficulty in walking, not elsewhere classified: Secondary | ICD-10-CM | POA: Diagnosis not present

## 2017-10-10 DIAGNOSIS — M5416 Radiculopathy, lumbar region: Secondary | ICD-10-CM | POA: Diagnosis not present

## 2017-10-10 NOTE — Patient Instructions (Addendum)
Backward Bend (Standing)    Arch backward to make hollow of back deeper. Hold _2___ seconds. Repeat __5__ times per set. Do ___1_ sets per session. Do __3-4__ sessions per day.  http://orth.exer.us/178   Copyright  VHI. All rights reserved.  Hamstring Stretch: Active    Support behind right knee. Starting with knee bent, attempt to straighten knee until a comfortable stretch is felt in back of thigh. Hold _30___ seconds. Repeat __3__ times per set. Do __1__ sets per session. Do __2__ sessions per day.  http://orth.exer.us/158   Copyright  VHI. All rights reserved.  Knee-to-Chest Stretch: Unilateral    With hand behind right knee, pull knee in to chest until a comfortable stretch is felt in lower back and buttocks. Keep back relaxed. Hold _20___ seconds. Repeat __3__ times per set. Do 1___ sets per session. Do _3___ sessions per day.  http://orth.exer.us/126   Copyright  VHI. All rights reserved.  Isometric Abdominal    Lying on back with knees bent, tighten stomach by pressing elbows down. Hold __5__ seconds. Repeat ___10_ times per set. Do __1__ sets per session. Do _2___ sessions per day.  http://orth.exer.us/1086   Copyright  VHI. All rights reserved.  Bridging    Slowly raise buttocks from floor, keeping stomach tight. Repeat __10__ times per set. Do _1___ sets per session. Do __2__ sessions per day.  http://orth.exer.us/1096   Copyright  VHI. All rights reserved.

## 2017-10-10 NOTE — Therapy (Signed)
[facility] [address] [place], [place], [address] Phone: [phone]   Fax:  [phone]  Physical Therapy Evaluation  Patient Details  Name: Eric Hinton MRN: [id number] Date of Birth: June 09, 1940 Referring Provider: Netta Cedars    Encounter Date: 10/10/2017  PT End of Session - 10/10/17 1606    Visit Number  1    Number of Visits  8    Date for PT Re-Evaluation  11/09/17    PT Start Time  9476    PT Stop Time  1600    PT Time Calculation (min)  45 min    Activity Tolerance  Patient tolerated treatment well    Behavior During Therapy  [facility] for tasks assessed/performed       Past Medical History:  Diagnosis Date  . Arthritis   . Carotid artery disease ([place])    Status post bilateral CEA - Dr. Donnetta Hutching  . Constipation   . COPD (chronic obstructive pulmonary disease) ([place])   . DDD (degenerative disc disease), cervical   . Degenerative disc disease, lumbar   . Essential hypertension   . Hyperlipidemia   . Macular degeneration   . Prostate cancer ([place])   . Skin cancer, basal cell   . Squamous cell lung cancer ([place]) 07/15/2015   Status post chemotherapy and XRT - Dr. Julien Nordmann  . Tubular adenoma of colon     Past Surgical History:  Procedure Laterality Date  . CATARACT EXTRACTION W/ INTRAOCULAR LENS  IMPLANT, BILATERAL Bilateral   . COLONOSCOPY    . COLONOSCOPY WITH PROPOFOL N/A 05/17/2017   Procedure: COLONOSCOPY WITH PROPOFOL;  Surgeon: Rogene Houston, MD;  [facility];  Service: Endoscopy;  Laterality: N/A;  10:15  . ENDARTERECTOMY Right 08/29/2015   Procedure: RIGHT CAROTID ENDARTERECTOMY WITH PATCH ANGIOPLASTY;  Surgeon: Rosetta Posner, MD;  Location: [place];  Service: Vascular;  Laterality: Right;  . ENDARTERECTOMY Left 10/07/2015   Procedure: LEFT CAROTID ARTERY ENDARTERECTOMY;  Surgeon: Rosetta Posner, MD;  Location: [place];  Service: Vascular;  Laterality: Left;  . NOSE SURGERY     "rebulit my nose; related to  skin cancer"  . PATCH ANGIOPLASTY Left 10/07/2015   Procedure: With [place];  Surgeon: Rosetta Posner, MD;  Location: [place];  Service: Vascular;  Laterality: Left;  . POLYPECTOMY  05/17/2017   Procedure: POLYPECTOMY;  Surgeon: Rogene Houston, MD;  [facility];  Service: Endoscopy;;  colon   . PROSTATE BIOPSY    . SKIN CANCER EXCISION Right    neck  . TONSILLECTOMY      There were no vitals filed for this visit.   Subjective Assessment - 10/10/17 1524    Subjective  Eric Hinton states that he has been having back pain for years.  His wife was seeing an orthopedic MD for her back therefore he asked her to make him an appointment as well.  He was having pain going down to his left calf but he recieved an injectiion last Tuesday and now the pain only goes into his buttock.     Pertinent History  non small cell lung cancer no treatment since 2014, DDD, COPD, cataract surgery B currently pt has limited eye sight only peripheral vision in RT eye.     Limitations  Walking;Standing    How long can you sit comfortably?  no difficulty     How long can you stand comfortably?  10-15 minutes  How long can you walk comfortably?  increase pain was almost immediate prior to shot but now he is doing better but he has not walked much since the shot.     Patient Stated Goals  to have less pain     Currently in Pain?  No/denies pre injection was an 8/10 when walking.          [facility] PT Assessment - 10/10/17 0001      Assessment   Medical Diagnosis  Low back pain     Referring Provider  Netta Cedars     Onset Date/Surgical Date  10/04/17    Next MD Visit  not scheduled     Prior Therapy  no      Precautions   Precautions  None      Restrictions   Weight Bearing Restrictions  No      Balance Screen   Has the patient fallen in the past 6 months  No    Has the patient had a decrease in activity level because of a fear of falling?   Yes    Is the  patient reluctant to leave their home because of a fear of falling?   No      Home Film/video editor residence    Home Access  Stairs to enter    Entrance Stairs-Number of Steps  Loving to live on main level with bedroom/bathroom      Prior Function   Level of Independence  Independent      Cognition   Overall Cognitive Status  Within Functional Limits for tasks assessed      Observation/Other Assessments   Focus on Therapeutic Outcomes (FOTO)   62      Functional Tests   Functional tests  Single leg stance;Sit to Stand      Single Leg Stance   Comments  LT LE 15; RT 4 (pt feels partially due to his lack of vision)       Sit to Stand   Comments  5 x 11.73       Posture/Postural Control   Posture/Postural Control  Postural limitations    Postural Limitations  Decreased lumbar lordosis      ROM / Strength   AROM / PROM / Strength  AROM;Strength      AROM   AROM Assessment Site  Lumbar    Lumbar Flexion  finges to toes     Lumbar Extension  20      Strength   Strength Assessment Site  Hip;Knee;Ankle    Right/Left Hip  Right;Left    Right Hip Flexion  5/5    Right Hip Extension  4+/5    Right Hip ABduction  5/5    Left Hip Flexion  5/5    Left Hip Extension  4+/5    Left Hip ABduction  5/5    Right/Left Knee  Right;Left    Right Knee Extension  5/5    Left Knee Extension  5/5    Right/Left Ankle  Right;Left    Right Ankle Dorsiflexion  5/5    Left Ankle Dorsiflexion  5/5      Flexibility   Soft Tissue Assessment /Muscle Length  yes    Hamstrings  LT 165; RT 165      Palpation   Palpation comment  tight in B paraspinal mm  Objective measurements completed on examination: See above findings.      [facility] Adult PT Treatment/Exercise - 10/10/17 0001      Exercises   Exercises  Lumbar      Lumbar Exercises: Stretches   Active Hamstring Stretch  3 reps;30 seconds    Single Knee to Chest  Stretch  3 reps;20 seconds    Standing Extension  5 reps      Lumbar Exercises: Supine   Ab Set  10 reps    Bridge  10 reps      Knee/Hip Exercises: Diplomatic Services operational officer  --             PT Education - 10/10/17 1605    Education Details  hep    Person(s) Educated  Patient    Methods  Explanation    Comprehension  Verbalized understanding       PT Short Term Goals - 10/10/17 1613      PT SHORT TERM GOAL #1   Title  PT to be aware of sitting and standing posture to decrease stress off his low back     Time  2    Period  Weeks    Status  New    Target Date  10/24/17      PT SHORT TERM GOAL #2   Title  Pt to be able to demonstrate good body mechanics for lifting to decrease pain in is low back.     Time  2    Period  Weeks    Status  New      PT SHORT TERM GOAL #3   Title  Pt to begin a walking exericse program  to improve activity tolerance to be able to walk for 15 mintues without resting for community ambulation     Time  2    Period  Weeks    Status  New        PT Long Term Goals - 10/10/17 1616      PT LONG TERM GOAL #1   Title  Pt to have no radicular symptoms while standing to allow pt to stand for 15 minutes for ADL such as showering     Time  4    Period  Weeks    Status  New    Target Date  11/07/17      PT LONG TERM GOAL #2   Title  Pt to be walking for 30 minutes without any low back or left leg pain to allow pt to go shopping with wife.     Time  4    Period  Weeks    Status  New      PT LONG TERM GOAL #3   Title  Pt to be able to single leg stance for 30 seconds bilaterally to reduce risk of falling     Time  4    Period  Weeks    Status  New             Plan - 10/10/17 1607    Clinical Impression Statement  Mr. Mejorado is a 77 yo man with chronic radicular low back pain.  He had an injection on Tuesday and has been painfree ever since.  His pain was upon weight bearing and would ease with sitting.  He has been  referred to skillled therapy for core stabilization and stretching.  Evaluation demonstrates good LE strength, decreased abdominal and back strength, decreased hip flexibilty and previous  to the injection decreased activity tolerance.   Mr. Redmon will benefit from education on posture, body mechanics as well as core strengthening and manual to decrease the tightness in his back to prevent his pain from returning.      History and Personal Factors relevant to plan of care:  PT is basically blind and slightly HOH.      Clinical Presentation  Stable    Clinical Decision Making  Low    Rehab Potential  Good    PT Frequency  2x / week    PT Duration  4 weeks    PT Treatment/Interventions  ADLs/Self Care Home Management;Therapeutic exercise;Manual techniques;Patient/family education    PT Next Visit Plan  Begin manual with gentle jt mobs, 3 D hip stretching , decompression exercises 1-5 progress abdominal strengthening and hip stretches as well as focusing on good sitting and posture.     PT Home Exercise Plan  knee to chest, ab set, bridge and hamstring stretch     Consulted and Agree with Plan of Care  Patient       Patient will benefit from skilled therapeutic intervention in order to improve the following deficits and impairments:  Decreased activity tolerance, Impaired flexibility, Postural dysfunction, Pain, Improper body mechanics, Difficulty walking, Decreased strength  Visit Diagnosis: Radiculopathy, lumbar region - Plan: PT plan of care cert/re-cert  Difficulty in walking, not elsewhere classified - Plan: PT plan of care cert/re-cert     Problem List Patient Active Problem List   Diagnosis Date Noted  . Hx of colonic polyps 04/03/2017  . Carotid artery stenosis, asymptomatic 10/07/2015  . Symptomatic carotid artery stenosis 08/29/2015  . Squamous cell carcinoma of right lung [facility]) 07/15/2015    Rayetta Humphrey, PT CLT [phone] 10/10/2017, 4:22 PM  [facility] [address] [place], [place], [address] Phone: [phone]   Fax:  [phone]  Name: CHRISHUN SCHEER MRN: [id number] Date of Birth: [birth year]

## 2017-10-15 ENCOUNTER — Ambulatory Visit (HOSPITAL_COMMUNITY): Payer: Medicare Other

## 2017-10-15 ENCOUNTER — Other Ambulatory Visit: Payer: Self-pay

## 2017-10-15 ENCOUNTER — Encounter (HOSPITAL_COMMUNITY): Payer: Self-pay

## 2017-10-15 DIAGNOSIS — M5416 Radiculopathy, lumbar region: Secondary | ICD-10-CM | POA: Diagnosis not present

## 2017-10-15 DIAGNOSIS — R262 Difficulty in walking, not elsewhere classified: Secondary | ICD-10-CM | POA: Diagnosis not present

## 2017-10-15 NOTE — Therapy (Signed)
[facility] [place], [place], [address] Phone: [phone]   Fax:  [phone]  Physical Therapy Treatment/Discharge Summary  Patient Details  Name: Eric Hinton MRN: [id number] Date of Birth: 1940-04-22 Referring Provider: Netta Cedars    Encounter Date: 10/15/2017   PHYSICAL THERAPY DISCHARGE SUMMARY  Visits from Start of Care: 2  Current functional level related to goals / functional outcomes: Patient arrived requesting to be discharged from therapy as he has not had any pain since receiving an injection last Tuesday in his back. He reported this weekend he worked around his house and yard and was able to lift and carry up to 40 lbs with no difficulty or pain. He states he has performed his HEP and did not have difficulty with it. Today he was educated on the benefit of injections for pain and that his symptoms may return as the shot wears off over time. I educated him that he can return to PT at any time if he feels his back is flaring up again and would like to address his postural and strength impairments. I educated him on additional HEP exercises. He will be discharged after this session.   Remaining deficits: No goals or deficits addressed with exception to HEP education. Patient self elected to discharge.   Education / Equipment: Educated on additional HEP exercises and to return to therapy if he feels his back pain flares up again and would like to address his strength and postural impairments. Provided initial Eval and reviewed with patient.  Plan: Patient agrees to discharge.  Patient goals were not met. Patient is being discharged due to the patient's request.  ?????       PT End of Session - 10/15/17 1053    Visit Number  2    Number of Visits  8    Date for PT Re-Evaluation  11/09/17    PT Start Time  1033    PT Stop Time  1058    PT Time Calculation (min)  25 min    Activity Tolerance  Patient  tolerated treatment well    Behavior During Therapy  [facility] for tasks assessed/performed       Past Medical History:  Diagnosis Date  . Arthritis   . Carotid artery disease ([place])    Status post bilateral CEA - Dr. Donnetta Hutching  . Constipation   . COPD (chronic obstructive pulmonary disease) ([place])   . DDD (degenerative disc disease), cervical   . Degenerative disc disease, lumbar   . Essential hypertension   . Hyperlipidemia   . Macular degeneration   . Prostate cancer ([place])   . Skin cancer, basal cell   . Squamous cell lung cancer ([place]) 07/15/2015   Status post chemotherapy and XRT - Dr. Julien Nordmann  . Tubular adenoma of colon     Past Surgical History:  Procedure Laterality Date  . CATARACT EXTRACTION W/ INTRAOCULAR LENS  IMPLANT, BILATERAL Bilateral   . COLONOSCOPY    . COLONOSCOPY WITH PROPOFOL N/A 05/17/2017   Procedure: COLONOSCOPY WITH PROPOFOL;  Surgeon: Rogene Houston, MD;  [facility];  Service: Endoscopy;  Laterality: N/A;  10:15  . ENDARTERECTOMY Right 08/29/2015   Procedure: RIGHT CAROTID ENDARTERECTOMY WITH PATCH ANGIOPLASTY;  Surgeon: Rosetta Posner, MD;  Location: [place];  Service: Vascular;  Laterality: Right;  . ENDARTERECTOMY Left 10/07/2015   Procedure: LEFT CAROTID ARTERY ENDARTERECTOMY;  Surgeon: Rosetta Posner, MD;  Location: [facility];  Service:  Vascular;  Laterality: Left;  . NOSE SURGERY     "rebulit my nose; related to skin cancer"  . PATCH ANGIOPLASTY Left 10/07/2015   Procedure: With [place];  Surgeon: Rosetta Posner, MD;  Location: [place];  Service: Vascular;  Laterality: Left;  . POLYPECTOMY  05/17/2017   Procedure: POLYPECTOMY;  Surgeon: Rogene Houston, MD;  [facility];  Service: Endoscopy;;  colon   . PROSTATE BIOPSY    . SKIN CANCER EXCISION Right    neck  . TONSILLECTOMY      There were no vitals filed for this visit.  Subjective Assessment - 10/15/17 1108    Subjective  Patient reprots he has performed his  HEP with no difficulty. He is requesting to be discharged as he feels he does not need therapy since his back has felt much better after recieving the injection.     Pertinent History  non small cell lung cancer no treatment since 2014, DDD, COPD, cataract surgery B currently pt has limited eye sight only peripheral vision in RT eye.     Limitations  Walking;Standing    How long can you sit comfortably?  no difficulty     How long can you stand comfortably?  10-15 minutes     How long can you walk comfortably?  increase pain was almost immediate prior to shot but now he is doing better but he has not walked much since the shot.     Patient Stated Goals  to have less pain     Currently in Pain?  No/denies       [facility] Adult PT Treatment/Exercise - 10/15/17 0001      Lumbar Exercises: Stretches   Active Hamstring Stretch  30 seconds;2 reps seated    Single Knee to Chest Stretch  Right;Left;5 reps;10 seconds    Other Lumbar Stretch Exercise  3D hip excursion: 5 reps each direction      Lumbar Exercises: Supine   Ab Set  10 reps    Bent Knee Raise  10 reps;Limitations    Bent Knee Raise Limitations  with TA activation    Bridge  10 reps        PT Education - 10/15/17 1107    Education Details  Educated on additional HEP exercises and to return to therapy if he feels his back pain flares up again and would like to address his strength and postural impairments. Provided initial Eval and reviewed with patient.    Person(s) Educated  Patient    Methods  Explanation;Handout    Comprehension  Verbalized understanding;Returned demonstration       PT Short Term Goals - 10/15/17 1112      PT SHORT TERM GOAL #1   Title  PT to be aware of sitting and standing posture to decrease stress off his low back     Time  2    Period  Weeks    Status  Unable to assess      PT SHORT TERM GOAL #2   Title  Pt to be able to demonstrate good body mechanics for lifting to decrease pain in is low back.      Time  2    Period  Weeks    Status  Unable to assess      PT SHORT TERM GOAL #3   Title  Pt to begin a walking exericse program  to improve activity tolerance to be able to walk for 15  mintues without resting for community ambulation     Time  2    Period  Weeks    Status  Unable to assess        PT Long Term Goals - 10/15/17 1113      PT LONG TERM GOAL #1   Title  Pt to have no radicular symptoms while standing to allow pt to stand for 15 minutes for ADL such as showering     Time  4    Period  Weeks    Status  Unable to assess      PT LONG TERM GOAL #2   Title  Pt to be walking for 30 minutes without any low back or left leg pain to allow pt to go shopping with wife.     Time  4    Period  Weeks    Status  Unable to assess      PT LONG TERM GOAL #3   Title  Pt to be able to single leg stance for 30 seconds bilaterally to reduce risk of falling     Time  4    Period  Weeks    Status  Unable to assess        Plan - 10/15/17 1053    Clinical Impression Statement  Patient arrived requesting to be discharged from therapy as he has not had any pain since receiving an injection last Tuesday in his back. He reported this weekend he worked around his house and yard and was able to lift and carry up to 40 lbs with no difficulty or pain. He states he has performed his HEP and did not have difficulty with it. Today he was educated on the benefit of injections for pain and that his symptoms may return as the shot wears off over time. I educated him that he can return to PT at any time if he feels his back is flaring up again and would like to address his postural and strength impairments. I educated him on additional HEP exercises. He will be discharged after this session.    Rehab Potential  Good    PT Frequency  2x / week    PT Duration  4 weeks    PT Treatment/Interventions  ADLs/Self Care Home Management;Therapeutic exercise;Manual techniques;Patient/family education    PT Next  Visit Plan  Discharging this session    PT Home Exercise Plan  knee to chest, ab set, bridge and hamstring stretch; 10/15/17 - 3D hip excursions, HS stretch seated, supine marching    Consulted and Agree with Plan of Care  Patient       Patient will benefit from skilled therapeutic intervention in order to improve the following deficits and impairments:  Decreased activity tolerance, Impaired flexibility, Postural dysfunction, Pain, Improper body mechanics, Difficulty walking, Decreased strength  Visit Diagnosis: Radiculopathy, lumbar region  Difficulty in walking, not elsewhere classified     Problem List Patient Active Problem List   Diagnosis Date Noted  . Hx of colonic polyps 04/03/2017  . Carotid artery stenosis, asymptomatic 10/07/2015  . Symptomatic carotid artery stenosis 08/29/2015  . Squamous cell carcinoma of right lung ([place]) 07/15/2015    Kipp Brood, PT, DPT Physical Therapist with [facility]  10/15/2017 11:13 AM    [facility] [place], [place], [address] Phone: [phone]   Fax:  [phone]  Name: Eric Hinton MRN: [id number] Date of Birth: 12/21/1940

## 2017-10-16 ENCOUNTER — Encounter (HOSPITAL_COMMUNITY): Payer: Medicare Other

## 2017-10-22 ENCOUNTER — Encounter (HOSPITAL_COMMUNITY): Payer: Medicare Other | Admitting: Physical Therapy

## 2017-10-22 DIAGNOSIS — I10 Essential (primary) hypertension: Secondary | ICD-10-CM | POA: Diagnosis not present

## 2017-10-22 DIAGNOSIS — J060 Acute laryngopharyngitis: Secondary | ICD-10-CM | POA: Diagnosis not present

## 2017-10-22 DIAGNOSIS — Z6830 Body mass index (BMI) 30.0-30.9, adult: Secondary | ICD-10-CM | POA: Diagnosis not present

## 2017-10-22 DIAGNOSIS — Z720 Tobacco use: Secondary | ICD-10-CM | POA: Diagnosis not present

## 2017-10-24 ENCOUNTER — Encounter (HOSPITAL_COMMUNITY): Payer: Medicare Other | Admitting: Physical Therapy

## 2017-10-29 ENCOUNTER — Encounter (HOSPITAL_COMMUNITY): Payer: Medicare Other | Admitting: Physical Therapy

## 2017-10-31 ENCOUNTER — Encounter (HOSPITAL_COMMUNITY): Payer: Medicare Other | Admitting: Physical Therapy

## 2017-11-01 DIAGNOSIS — J449 Chronic obstructive pulmonary disease, unspecified: Secondary | ICD-10-CM | POA: Diagnosis not present

## 2017-11-01 DIAGNOSIS — K047 Periapical abscess without sinus: Secondary | ICD-10-CM | POA: Diagnosis not present

## 2017-11-01 DIAGNOSIS — I10 Essential (primary) hypertension: Secondary | ICD-10-CM | POA: Diagnosis not present

## 2017-11-01 DIAGNOSIS — Z6832 Body mass index (BMI) 32.0-32.9, adult: Secondary | ICD-10-CM | POA: Diagnosis not present

## 2017-11-05 ENCOUNTER — Encounter (HOSPITAL_COMMUNITY): Payer: Medicare Other

## 2017-11-07 ENCOUNTER — Encounter (HOSPITAL_COMMUNITY): Payer: Medicare Other

## 2017-11-12 DIAGNOSIS — H35033 Hypertensive retinopathy, bilateral: Secondary | ICD-10-CM | POA: Diagnosis not present

## 2017-11-12 DIAGNOSIS — H353231 Exudative age-related macular degeneration, bilateral, with active choroidal neovascularization: Secondary | ICD-10-CM | POA: Diagnosis not present

## 2018-01-08 DIAGNOSIS — M7532 Calcific tendinitis of left shoulder: Secondary | ICD-10-CM | POA: Diagnosis not present

## 2018-01-08 DIAGNOSIS — M7552 Bursitis of left shoulder: Secondary | ICD-10-CM | POA: Diagnosis not present

## 2018-01-08 DIAGNOSIS — M25512 Pain in left shoulder: Secondary | ICD-10-CM | POA: Diagnosis not present

## 2018-01-10 DIAGNOSIS — H353231 Exudative age-related macular degeneration, bilateral, with active choroidal neovascularization: Secondary | ICD-10-CM | POA: Diagnosis not present

## 2018-01-10 DIAGNOSIS — H353221 Exudative age-related macular degeneration, left eye, with active choroidal neovascularization: Secondary | ICD-10-CM | POA: Diagnosis not present

## 2018-02-03 ENCOUNTER — Telehealth: Payer: Self-pay | Admitting: Medical Oncology

## 2018-02-03 ENCOUNTER — Other Ambulatory Visit: Payer: Self-pay | Admitting: Medical Oncology

## 2018-02-03 DIAGNOSIS — C3491 Malignant neoplasm of unspecified part of right bronchus or lung: Secondary | ICD-10-CM

## 2018-02-03 NOTE — Telephone Encounter (Signed)
LVM that lab orders are entered for [facility].

## 2018-02-07 ENCOUNTER — Ambulatory Visit (HOSPITAL_COMMUNITY)
Admission: RE | Admit: 2018-02-07 | Discharge: 2018-02-07 | Disposition: A | Discharge status: Home / Self Care | Payer: Medicare Other | Source: Ambulatory Visit | Attending: Internal Medicine | Admitting: Internal Medicine

## 2018-02-07 ENCOUNTER — Other Ambulatory Visit (HOSPITAL_COMMUNITY)
Admission: RE | Admit: 2018-02-07 | Discharge: 2018-02-07 | Disposition: A | Discharge status: Home / Self Care | Payer: Medicare Other | Source: Ambulatory Visit | Attending: Internal Medicine | Admitting: Internal Medicine

## 2018-02-07 ENCOUNTER — Encounter (HOSPITAL_COMMUNITY): Payer: Self-pay

## 2018-02-07 DIAGNOSIS — I700 Atherosclerosis of aorta: Secondary | ICD-10-CM | POA: Diagnosis not present

## 2018-02-07 DIAGNOSIS — C3490 Malignant neoplasm of unspecified part of unspecified bronchus or lung: Secondary | ICD-10-CM | POA: Insufficient documentation

## 2018-02-07 DIAGNOSIS — J439 Emphysema, unspecified: Secondary | ICD-10-CM | POA: Diagnosis not present

## 2018-02-07 LAB — CBC WITH DIFFERENTIAL/PLATELET
ABS IMMATURE GRANULOCYTES: 0.02 10*3/uL (ref 0.00–0.07)
Basophils Absolute: 0 10*3/uL (ref 0.0–0.1)
Basophils Relative: 1 %
Eosinophils Absolute: 0.1 10*3/uL (ref 0.0–0.5)
Eosinophils Relative: 2 %
HEMATOCRIT: 44.2 % (ref 39.0–52.0)
HEMOGLOBIN: 14.6 g/dL (ref 13.0–17.0)
IMMATURE GRANULOCYTES: 0 %
LYMPHS ABS: 1.6 10*3/uL (ref 0.7–4.0)
LYMPHS PCT: 26 %
MCH: 31.6 pg (ref 26.0–34.0)
MCHC: 33 g/dL (ref 30.0–36.0)
MCV: 95.7 fL (ref 80.0–100.0)
MONO ABS: 0.4 10*3/uL (ref 0.1–1.0)
MONOS PCT: 7 %
NEUTROS ABS: 4 10*3/uL (ref 1.7–7.7)
Neutrophils Relative %: 64 %
Platelets: 192 10*3/uL (ref 150–400)
RBC: 4.62 MIL/uL (ref 4.22–5.81)
RDW: 13.1 % (ref 11.5–15.5)
WBC: 6.2 10*3/uL (ref 4.0–10.5)
nRBC: 0 % (ref 0.0–0.2)

## 2018-02-07 LAB — COMPREHENSIVE METABOLIC PANEL
ALK PHOS: 54 U/L (ref 38–126)
ALT: 22 U/L (ref 0–44)
AST: 21 U/L (ref 15–41)
Albumin: 4.2 g/dL (ref 3.5–5.0)
Anion gap: 8 (ref 5–15)
BUN: 14 mg/dL (ref 8–23)
CO2: 25 mmol/L (ref 22–32)
Calcium: 9.7 mg/dL (ref 8.9–10.3)
Chloride: 106 mmol/L (ref 98–111)
Creatinine, Ser: 1 mg/dL (ref 0.61–1.24)
GFR calc Af Amer: >60 mL/min (ref >60)
GFR calc non Af Amer: >60 mL/min (ref >60)
GLUCOSE: 113 mg/dL — AB (ref 70–99)
POTASSIUM: 4.1 mmol/L (ref 3.5–5.1)
SODIUM: 139 mmol/L (ref 135–145)
TOTAL PROTEIN: 7.6 g/dL (ref 6.5–8.1)
Total Bilirubin: 0.7 mg/dL (ref 0.3–1.2)

## 2018-02-07 MED ORDER — IOHEXOL 300 MG/ML  SOLN
75.0000 mL | Freq: Once | INTRAMUSCULAR | Status: AC | PRN
  Administered 2018-02-07: 75 mL via INTRAVENOUS

## 2018-02-11 ENCOUNTER — Telehealth: Payer: Self-pay | Admitting: Hematology and Oncology

## 2018-02-11 ENCOUNTER — Telehealth: Payer: Self-pay | Admitting: Internal Medicine

## 2018-02-11 NOTE — Telephone Encounter (Signed)
Gave patient avs report and appointments for December 2019 thru November 20120.

## 2018-02-11 NOTE — Telephone Encounter (Signed)
Returned call to patient's wife re when next f/u would be. Checked last los from May 2019 and patient was to return for f/u in 6 months. Gave wife appointment for 11/25. Appointment for May 2020 left on schedule and will be changedupdated based on orders provided at 11/25 visit.

## 2018-02-17 ENCOUNTER — Encounter: Payer: Self-pay | Admitting: Internal Medicine

## 2018-02-17 ENCOUNTER — Inpatient Hospital Stay: Payer: Medicare Other | Attending: Internal Medicine | Admitting: Internal Medicine

## 2018-02-17 ENCOUNTER — Encounter: Payer: Self-pay | Admitting: *Deleted

## 2018-02-17 ENCOUNTER — Other Ambulatory Visit: Payer: Self-pay | Admitting: *Deleted

## 2018-02-17 ENCOUNTER — Inpatient Hospital Stay: Payer: Medicare Other

## 2018-02-17 DIAGNOSIS — C3491 Malignant neoplasm of unspecified part of right bronchus or lung: Secondary | ICD-10-CM

## 2018-02-17 DIAGNOSIS — Z85118 Personal history of other malignant neoplasm of bronchus and lung: Secondary | ICD-10-CM | POA: Diagnosis not present

## 2018-02-17 DIAGNOSIS — Z006 Encounter for examination for normal comparison and control in clinical research program: Secondary | ICD-10-CM

## 2018-02-17 DIAGNOSIS — C3490 Malignant neoplasm of unspecified part of unspecified bronchus or lung: Secondary | ICD-10-CM

## 2018-02-17 LAB — RESEARCH LABS

## 2018-02-17 NOTE — Research (Signed)
PATHOLOGY PROCUREMENT : Customer service manager for the Discovery and Validation of Biomarkers for the Prediction, Diagnosis, and Management of Disease  I met with patient and wife, in-clinic for scheduled appointments, in exam room after his appointment with Dr Julien Nordmann, MD. Patient stated that he would like to participate and was interestedin signing consent today.I reviewed withpatient, , both the studyauthorizationand consent form. Patient expressedunderstanding tothe purpose ofthe study, the voluntary nature of thestudy, the procedure, potential risks and benefits, as well as, how his information would be kept confidential, and any costs and the compensation. After all of patient's questions answered to hissatisfaction, patient proceededto sign both the studyauthorizationand ICF. Patient was informed that the one-timestudy blood draw would occur and a research assistant will provide patient with the $50 debit card,provided by study, afterthe blood draw. Patient gave verbal understanding anddenied any other questionsat this time. Patient was provided with copies of both signeddocuments, for his records, as well as the Clinical Trials information pamphlet and my contactinformation. Patient was thanked for his time and his contribution to study andwas encouraged to call with questions.  Approximately 10 minutes was spentconsentingpatient. Farris Has, Research Assistant

## 2018-02-17 NOTE — Progress Notes (Signed)
[facility] [phone]   [phone]  OFFICE PROGRESS NOTE  [name], MD [place] [address]  DIAGNOSIS: Clinically stage IIIA (T1a, N2, M0) non-small cell lung cancer, poorly differentiated squamous cell carcinoma diagnosed in November 2013   PRIOR THERAPY: status post course of concurrent chemoradiation with weekly carboplatin and paclitaxel when the patient was in [place].   CURRENT THERAPY: Observation.  INTERVAL HISTORY: Eric Hinton 77 y.o. male returns to the clinic today for six-month follow-up visit accompanied by his wife.  The patient is feeling fine today with no concerning complaints.  He denied having any chest pain, shortness of breath, cough or hemoptysis.  He continues to have arthralgia of the left shoulder and he received a steroid injection there.  He denied having any weight loss or night sweats.  He has no nausea, vomiting, diarrhea or constipation.  The patient is here today for evaluation with repeat CT scan of the chest for restaging of his disease.  MEDICAL HISTORY: Past Medical History:  Diagnosis Date  . Arthritis   . Carotid artery disease ([place])    Status post bilateral CEA - Dr. Donnetta Hutching  . Constipation   . COPD (chronic obstructive pulmonary disease) ([place])   . DDD (degenerative disc disease), cervical   . Degenerative disc disease, lumbar   . Essential hypertension   . Hyperlipidemia   . Macular degeneration   . Prostate cancer ([place])   . Skin cancer, basal cell   . Squamous cell lung cancer ([place]) 07/15/2015   Status post chemotherapy and XRT - Dr. Julien Nordmann  . Tubular adenoma of colon     ALLERGIES:  is allergic to wellbutrin [bupropion].  MEDICATIONS:  Current Outpatient Medications  Medication Sig Dispense Refill  . aspirin 325 MG tablet Take 1 tablet (325 mg total) by mouth daily.    Marland Kitchen atorvastatin (LIPITOR) 10 MG tablet Take 10 mg by mouth daily.    . bevacizumab (AVASTIN)  400 MG/16ML SOLN 1.25 mg every 8 (eight) weeks. Left eye    . Cyanocobalamin (VITAMIN B-12) 2500 MCG SUBL Place 2,500 mcg under the tongue daily.    Marland Kitchen FLUoxetine (PROZAC) 20 MG capsule Take 20 mg by mouth daily.    Marland Kitchen latanoprost (XALATAN) 0.005 % ophthalmic solution Place 1 drop into both eyes at bedtime.    Marland Kitchen menthol-zinc oxide (GOLD BOND) powder Apply 1 application topically daily as needed (for itching).    . Multiple Vitamins-Minerals (PRESERVISION AREDS 2) CAPS Take 1 capsule by mouth 2 (two) times daily.     . naproxen sodium (ANAPROX) 220 MG tablet Take 440 mg by mouth 2 (two) times daily as needed (for pain).    . tamsulosin (FLOMAX) 0.4 MG CAPS capsule TAKE 1 CAPSULE BY MOUTH DAILY 30 capsule 0  . telmisartan (MICARDIS) 80 MG tablet Take 80 mg by mouth daily.     No current facility-administered medications for this visit.     SURGICAL HISTORY:  Past Surgical History:  Procedure Laterality Date  . CATARACT EXTRACTION W/ INTRAOCULAR LENS  IMPLANT, BILATERAL Bilateral   . COLONOSCOPY    . COLONOSCOPY WITH PROPOFOL N/A 05/17/2017   Procedure: COLONOSCOPY WITH PROPOFOL;  Surgeon: Rogene Houston, MD;  [facility];  Service: Endoscopy;  Laterality: N/A;  10:15  . ENDARTERECTOMY Right 08/29/2015   Procedure: RIGHT CAROTID ENDARTERECTOMY WITH PATCH ANGIOPLASTY;  Surgeon: Rosetta Posner, MD;  Location: [place];  Service: Vascular;  Laterality: Right;  . ENDARTERECTOMY Left 10/07/2015   Procedure: LEFT CAROTID ARTERY ENDARTERECTOMY;  Surgeon: Rosetta Posner, MD;  Location: [place];  Service: Vascular;  Laterality: Left;  . NOSE SURGERY     "rebulit my nose; related to skin cancer"  . PATCH ANGIOPLASTY Left 10/07/2015   Procedure: With [place];  Surgeon: Rosetta Posner, MD;  Location: [place];  Service: Vascular;  Laterality: Left;  . POLYPECTOMY  05/17/2017   Procedure: POLYPECTOMY;  Surgeon: Rogene Houston, MD;  [facility];  Service: Endoscopy;;   colon   . PROSTATE BIOPSY    . SKIN CANCER EXCISION Right    neck  . TONSILLECTOMY      REVIEW OF SYSTEMS:  A comprehensive review of systems was negative except for: Musculoskeletal: positive for arthralgias   PHYSICAL EXAMINATION: General appearance: alert, cooperative and no distress Head: Normocephalic, without obvious abnormality, atraumatic Neck: no adenopathy, no JVD, supple, symmetrical, trachea midline and thyroid not enlarged, symmetric, no tenderness/mass/nodules Lymph nodes: Cervical, supraclavicular, and axillary nodes normal. Resp: clear to auscultation bilaterally Back: symmetric, no curvature. ROM normal. No CVA tenderness. Cardio: regular rate and rhythm, S1, S2 normal, no murmur, click, rub or gallop GI: soft, non-tender; bowel sounds normal; no masses,  no organomegaly Extremities: extremities normal, atraumatic, no cyanosis or edema  ECOG PERFORMANCE STATUS: 1 - Symptomatic but completely ambulatory  Blood pressure (!) 142/73, pulse (!) 105, temperature 97.9 F (36.6 C), temperature source Oral, resp. rate 18, height 5\' 6"  (1.676 m), weight 195 lb 14.4 oz (88.9 kg), SpO2 97 %.  LABORATORY DATA: Lab Results  Component Value Date   WBC 6.2 02/07/2018   HGB 14.6 02/07/2018   HCT 44.2 02/07/2018   MCV 95.7 02/07/2018   PLT 192 02/07/2018      Chemistry      Component Value Date/Time   NA 139 02/07/2018 1247   NA 139 08/03/2016 1029   K 4.1 02/07/2018 1247   K 4.8 08/03/2016 1029   CL 106 02/07/2018 1247   CO2 25 02/07/2018 1247   CO2 23 08/03/2016 1029   BUN 14 02/07/2018 1247   BUN 19.4 08/03/2016 1029   CREATININE 1.00 02/07/2018 1247   CREATININE 1.1 08/03/2016 1029      Component Value Date/Time   CALCIUM 9.7 02/07/2018 1247   CALCIUM 9.8 08/03/2016 1029   ALKPHOS 54 02/07/2018 1247   ALKPHOS 67 08/03/2016 1029   AST 21 02/07/2018 1247   AST 17 08/03/2016 1029   ALT 22 02/07/2018 1247   ALT 24 08/03/2016 1029   BILITOT 0.7 02/07/2018 1247    BILITOT 0.65 08/03/2016 1029       RADIOGRAPHIC STUDIES: Ct Chest W Contrast  Result Date: 02/07/2018 CLINICAL DATA:  Stage IIIA non-small cell lung cancer, diagnosed Nov 2013, status post concurrent chemoradiation EXAM: CT CHEST WITH CONTRAST TECHNIQUE: Multidetector CT imaging of the chest was performed during intravenous contrast administration. CONTRAST:  60mL OMNIPAQUE IOHEXOL 300 MG/ML  SOLN COMPARISON:  08/08/2017 FINDINGS: Cardiovascular: The heart is normal in size. No pericardial effusion. No evidence of thoracic aortic aneurysm. Atherosclerotic calcifications of the aortic arch. Three vessel coronary atherosclerosis. Mediastinum/Nodes: No suspicious mediastinal lymphadenopathy. 6 mm short axis subcarinal node. 9 mm short axis right hilar node (series 2/image 62), previously 12 mm. No suspicious axillary lymphadenopathy. Visualized thyroid is unremarkable. Lungs/Pleura: Radiation changes in the medial right upper lobe (series 4/image 46) and in the anterior right middle  lobe (series 4/image 70). Stable 3 mm nodule in the superior segment left lower lobe (series 4/image 44). Stable 4 mm right lower lobe nodule (series 4/image 90). Additional faint centrilobular nodularity bilaterally. No new/suspicious pulmonary nodules. Mild centrilobular and paraseptal emphysematous changes, upper lobe predominant. No focal consolidation. No pleural effusion or pneumothorax. Upper Abdomen: Visualized upper abdomen is notable for left hepatic cysts and suspected layering gallstones (series 2/image 157). 3.8 cm left renal cyst (series 2/image 131), mildly complex although incompletely visualized, better evaluated in 2011. Musculoskeletal: Degenerative changes of the visualized thoracolumbar spine. IMPRESSION: Radiation changes in the right upper and middle lobes. No findings specific for recurrent or metastatic disease. 9 mm right hilar node, mildly decreased. Stable small bilateral pulmonary nodules, likely  benign. Aortic Atherosclerosis (ICD10-I70.0) and Emphysema (ICD10-J43.9). Electronically Signed   By: Julian Hy M.D.   On: 02/07/2018 17:30    ASSESSMENT AND PLAN:  This is a very pleasant 77 years old white male with history of stage IIIa non-small cell lung cancer diagnosed in November 2013 status post course of concurrent chemoradiation in [place].  The patient has been in observation since early 2014 with no concerning complaints. He had repeat CT scan of the chest performed recently.  I personally and independently reviewed the scans and discussed the results with the patient today.  His scan showed no concerning findings for disease recurrence or progression. I recommended for the patient to continue on observation for now with repeat CT scan of the chest in 1 year. The patient was advised to call immediately if he has any concerning symptoms in the interval. The patient voices understanding of current disease status and treatment options and is in agreement with the current care plan. All questions were answered. The patient knows to call the clinic with any problems, questions or concerns. We can certainly see the patient much sooner if necessary. I spent 10 minutes counseling the patient face to face. The total time spent in the appointment was 15 minutes.  Disclaimer: This note was dictated with voice recognition software. Similar sounding words can inadvertently be transcribed and may not be corrected upon review.

## 2018-02-17 NOTE — Progress Notes (Signed)
Patient labs drawn today per protocol-Lung Path.Patient was given a $50 debit card for participating in the research study. Patient was thanked for his time and contribution to the study.

## 2018-02-17 NOTE — Patient Instructions (Signed)
Steps to Quit Smoking Smoking tobacco can be bad for your health. It can also affect almost every organ in your body. Smoking puts you and people around you at risk for many serious long-lasting (chronic) diseases. Quitting smoking is hard, but it is one of the best things that you can do for your health. It is never too late to quit. What are the benefits of quitting smoking? When you quit smoking, you lower your risk for getting serious diseases and conditions. They can include:  Lung cancer or lung disease.  Heart disease.  Stroke.  Heart attack.  Not being able to have children (infertility).  Weak bones (osteoporosis) and broken bones (fractures).  If you have coughing, wheezing, and shortness of breath, those symptoms may get better when you quit. You may also get sick less often. If you are pregnant, quitting smoking can help to lower your chances of having a baby of low birth weight. What can I do to help me quit smoking? Talk with your doctor about what can help you quit smoking. Some things you can do (strategies) include:  Quitting smoking totally, instead of slowly cutting back how much you smoke over a period of time.  Going to in-person counseling. You are more likely to quit if you go to many counseling sessions.  Using resources and support systems, such as: ? Online chats with a counselor. ? Phone quitlines. ? Printed self-help materials. ? Support groups or group counseling. ? Text messaging programs. ? Mobile phone apps or applications.  Taking medicines. Some of these medicines may have nicotine in them. If you are pregnant or breastfeeding, do not take any medicines to quit smoking unless your doctor says it is okay. Talk with your doctor about counseling or other things that can help you.  Talk with your doctor about using more than one strategy at the same time, such as taking medicines while you are also going to in-person counseling. This can help make  quitting easier. What things can I do to make it easier to quit? Quitting smoking might feel very hard at first, but there is a lot that you can do to make it easier. Take these steps:  Talk to your family and friends. Ask them to support and encourage you.  Call phone quitlines, reach out to support groups, or work with a counselor.  Ask people who smoke to not smoke around you.  Avoid places that make you want (trigger) to smoke, such as: ? Bars. ? Parties. ? Smoke-break areas at work.  Spend time with people who do not smoke.  Lower the stress in your life. Stress can make you want to smoke. Try these things to help your stress: ? Getting regular exercise. ? Deep-breathing exercises. ? Yoga. ? Meditating. ? Doing a body scan. To do this, close your eyes, focus on one area of your body at a time from head to toe, and notice which parts of your body are tense. Try to relax the muscles in those areas.  Download or buy apps on your mobile phone or tablet that can help you stick to your quit plan. There are many free apps, such as QuitGuide from the CDC (Centers for Disease Control and Prevention). You can find more support from smokefree.gov and other websites.  This information is not intended to replace advice given to you by your health care provider. Make sure you discuss any questions you have with your health care provider. Document Released: 01/06/2009 Document   Revised: 11/08/2015 Document Reviewed: 07/27/2014 Elsevier Interactive Patient Education  2018 Elsevier Inc.  

## 2018-03-14 DIAGNOSIS — H353231 Exudative age-related macular degeneration, bilateral, with active choroidal neovascularization: Secondary | ICD-10-CM | POA: Diagnosis not present

## 2018-04-03 DIAGNOSIS — I10 Essential (primary) hypertension: Secondary | ICD-10-CM | POA: Diagnosis not present

## 2018-04-03 DIAGNOSIS — C61 Malignant neoplasm of prostate: Secondary | ICD-10-CM | POA: Diagnosis not present

## 2018-04-03 DIAGNOSIS — Z6832 Body mass index (BMI) 32.0-32.9, adult: Secondary | ICD-10-CM | POA: Diagnosis not present

## 2018-04-03 DIAGNOSIS — D0220 Carcinoma in situ of unspecified bronchus and lung: Secondary | ICD-10-CM | POA: Diagnosis not present

## 2018-04-03 DIAGNOSIS — E875 Hyperkalemia: Secondary | ICD-10-CM | POA: Diagnosis not present

## 2018-04-03 DIAGNOSIS — R7301 Impaired fasting glucose: Secondary | ICD-10-CM | POA: Diagnosis not present

## 2018-04-03 DIAGNOSIS — R972 Elevated prostate specific antigen [PSA]: Secondary | ICD-10-CM | POA: Diagnosis not present

## 2018-04-03 DIAGNOSIS — F411 Generalized anxiety disorder: Secondary | ICD-10-CM | POA: Diagnosis not present

## 2018-04-03 DIAGNOSIS — R944 Abnormal results of kidney function studies: Secondary | ICD-10-CM | POA: Diagnosis not present

## 2018-04-10 DIAGNOSIS — R7301 Impaired fasting glucose: Secondary | ICD-10-CM | POA: Diagnosis not present

## 2018-04-10 DIAGNOSIS — I348 Other nonrheumatic mitral valve disorders: Secondary | ICD-10-CM | POA: Diagnosis not present

## 2018-04-10 DIAGNOSIS — H539 Unspecified visual disturbance: Secondary | ICD-10-CM | POA: Diagnosis not present

## 2018-04-10 DIAGNOSIS — E782 Mixed hyperlipidemia: Secondary | ICD-10-CM | POA: Diagnosis not present

## 2018-04-10 DIAGNOSIS — R972 Elevated prostate specific antigen [PSA]: Secondary | ICD-10-CM | POA: Diagnosis not present

## 2018-04-10 DIAGNOSIS — R944 Abnormal results of kidney function studies: Secondary | ICD-10-CM | POA: Diagnosis not present

## 2018-04-10 DIAGNOSIS — H3530 Unspecified macular degeneration: Secondary | ICD-10-CM | POA: Diagnosis not present

## 2018-04-10 DIAGNOSIS — D0220 Carcinoma in situ of unspecified bronchus and lung: Secondary | ICD-10-CM | POA: Diagnosis not present

## 2018-04-10 DIAGNOSIS — I10 Essential (primary) hypertension: Secondary | ICD-10-CM | POA: Diagnosis not present

## 2018-04-10 DIAGNOSIS — I2510 Atherosclerotic heart disease of native coronary artery without angina pectoris: Secondary | ICD-10-CM | POA: Diagnosis not present

## 2018-04-10 DIAGNOSIS — I350 Nonrheumatic aortic (valve) stenosis: Secondary | ICD-10-CM | POA: Diagnosis not present

## 2018-04-10 DIAGNOSIS — I6529 Occlusion and stenosis of unspecified carotid artery: Secondary | ICD-10-CM | POA: Diagnosis not present

## 2018-05-13 DIAGNOSIS — H353231 Exudative age-related macular degeneration, bilateral, with active choroidal neovascularization: Secondary | ICD-10-CM | POA: Diagnosis not present

## 2018-05-20 ENCOUNTER — Other Ambulatory Visit: Payer: Self-pay

## 2018-05-20 DIAGNOSIS — Z9889 Other specified postprocedural states: Secondary | ICD-10-CM

## 2018-05-20 DIAGNOSIS — I6523 Occlusion and stenosis of bilateral carotid arteries: Secondary | ICD-10-CM

## 2018-05-23 ENCOUNTER — Other Ambulatory Visit: Payer: Self-pay

## 2018-05-23 ENCOUNTER — Ambulatory Visit (INDEPENDENT_AMBULATORY_CARE_PROVIDER_SITE_OTHER): Payer: Medicare Other | Admitting: Physician Assistant

## 2018-05-23 ENCOUNTER — Ambulatory Visit (HOSPITAL_COMMUNITY)
Admission: RE | Admit: 2018-05-23 | Discharge: 2018-05-23 | Disposition: A | Discharge status: Home / Self Care | Payer: Medicare Other | Source: Ambulatory Visit | Attending: Vascular Surgery | Admitting: Vascular Surgery

## 2018-05-23 ENCOUNTER — Encounter: Payer: Self-pay | Admitting: Family

## 2018-05-23 VITALS — BP 122/76 | HR 83 | Temp 97.0°F | Resp 16 | Ht 66.0 in | Wt 195.8 lb

## 2018-05-23 DIAGNOSIS — Z9889 Other specified postprocedural states: Secondary | ICD-10-CM | POA: Diagnosis not present

## 2018-05-23 DIAGNOSIS — I6523 Occlusion and stenosis of bilateral carotid arteries: Secondary | ICD-10-CM | POA: Insufficient documentation

## 2018-05-23 NOTE — Progress Notes (Signed)
History of Present Illness:  Patient is a 78 y.o. year old male who presents for evaluation of carotid stenosis. He underwent right carotid endarterectomy in June 2017 and left carotid endarterectomy in July 2017 by Dr. Donnetta Hutching. He was found to have bilateral critical carotid stenosis. He was being worked up for visual changes which were not classic for amaurosis fugax. He continues to have episodes of his right eye with blurred vision. He has macular degeneration and glaucoma in both eyes. He receives injections in the left eye, wife states injections in the right eye did not help.   He denies weakness, loss of motor and amaurosis.   Pt meds include: Statin : yes ASA: yes Other anticoagulants/antiplatelets: no  Past Medical History:  Diagnosis Date  . Arthritis   . Carotid artery disease ([place])    Status post bilateral CEA - Dr. Donnetta Hutching  . Constipation   . COPD (chronic obstructive pulmonary disease) ([place])   . DDD (degenerative disc disease), cervical   . Degenerative disc disease, lumbar   . Essential hypertension   . Hyperlipidemia   . Macular degeneration   . Prostate cancer ([place])   . Skin cancer, basal cell   . Squamous cell lung cancer ([place]) 07/15/2015   Status post chemotherapy and XRT - Dr. Julien Nordmann  . Tubular adenoma of colon     Past Surgical History:  Procedure Laterality Date  . CATARACT EXTRACTION W/ INTRAOCULAR LENS  IMPLANT, BILATERAL Bilateral   . COLONOSCOPY    . COLONOSCOPY WITH PROPOFOL N/A 05/17/2017   Procedure: COLONOSCOPY WITH PROPOFOL;  Surgeon: Rogene Houston, MD;  [facility];  Service: Endoscopy;  Laterality: N/A;  10:15  . ENDARTERECTOMY Right 08/29/2015   Procedure: RIGHT CAROTID ENDARTERECTOMY WITH PATCH ANGIOPLASTY;  Surgeon: Rosetta Posner, MD;  Location: [place];  Service: Vascular;  Laterality: Right;  . ENDARTERECTOMY Left 10/07/2015   Procedure: LEFT CAROTID ARTERY ENDARTERECTOMY;  Surgeon: Rosetta Posner, MD;  Location: [place];   Service: Vascular;  Laterality: Left;  . NOSE SURGERY     "rebulit my nose; related to skin cancer"  . PATCH ANGIOPLASTY Left 10/07/2015   Procedure: With [place];  Surgeon: Rosetta Posner, MD;  Location: [place];  Service: Vascular;  Laterality: Left;  . POLYPECTOMY  05/17/2017   Procedure: POLYPECTOMY;  Surgeon: Rogene Houston, MD;  [facility];  Service: Endoscopy;;  colon   . PROSTATE BIOPSY    . SKIN CANCER EXCISION Right    neck  . TONSILLECTOMY       Social History Social History   Tobacco Use  . Smoking status: Current Every Day Smoker    Packs/day: 1.50    Years: 69.00    Pack years: 103.50    Types: Cigarettes    Start date: 04[birth year]/1946  . Smokeless tobacco: Never Used  Substance Use Topics  . Alcohol use: Yes    Alcohol/week: 35.0 standard drinks    Types: 35 Cans of beer per week    Comment: 10/07/2015 "4-6 beers/day"  . Drug use: No    Family History Family History  Problem Relation Age of Onset  . Cancer Mother   . Stroke Father   . Heart disease Father   . Diabetes Mellitus II Sister     Allergies  Allergies  Allergen Reactions  . Bupropion Hives     Current Outpatient Medications  Medication Sig Dispense Refill  . aspirin  325 MG tablet Take 1 tablet (325 mg total) by mouth daily.    Marland Kitchen atorvastatin (LIPITOR) 10 MG tablet Take 10 mg by mouth daily.    . bevacizumab (AVASTIN) 400 MG/16ML SOLN 1.25 mg every 8 (eight) weeks. Left eye    . Cyanocobalamin (VITAMIN B-12) 2500 MCG SUBL Place 2,500 mcg under the tongue daily.    Marland Kitchen FLUoxetine (PROZAC) 20 MG capsule Take 20 mg by mouth daily.    Marland Kitchen latanoprost (XALATAN) 0.005 % ophthalmic solution Place 1 drop into both eyes at bedtime.    Marland Kitchen menthol-zinc oxide (GOLD BOND) powder Apply 1 application topically daily as needed (for itching).    . Multiple Vitamins-Minerals (PRESERVISION AREDS 2) CAPS Take 1 capsule by mouth 2 (two) times daily.     . naproxen sodium  (ANAPROX) 220 MG tablet Take 440 mg by mouth 2 (two) times daily as needed (for pain).    . tamsulosin (FLOMAX) 0.4 MG CAPS capsule TAKE 1 CAPSULE BY MOUTH DAILY 30 capsule 0  . telmisartan (MICARDIS) 80 MG tablet Take 80 mg by mouth daily.     No current facility-administered medications for this visit.     ROS:   General:  No weight loss, Fever, chills  HEENT: No recent headaches, no nasal bleeding, no visual changes, no sore throat  Neurologic: No dizziness, blackouts, seizures. No recent symptoms of stroke or mini- stroke. No recent episodes of slurred speech, or temporary blindness.  Cardiac: No recent episodes of chest pain/pressure, no shortness of breath at rest.  No shortness of breath with exertion.  Denies history of atrial fibrillation or irregular heartbeat  Vascular: No history of rest pain in feet.  No history of claudication.  No history of non-healing ulcer, No history of DVT   Pulmonary: No home oxygen, no productive cough, no hemoptysis,  No asthma or wheezing  Musculoskeletal:  [ ]  Arthritis, [ ]  Low back pain,  [x ] Joint pain  Hematologic:No history of hypercoagulable state.  No history of easy bleeding.  No history of anemia  Gastrointestinal: No hematochezia or melena,  No gastroesophageal reflux, no trouble swallowing  Urinary: [ ]  chronic Kidney disease, [ ]  on HD - [ ]  MWF or [ ]  TTHS, [ ]  Burning with urination, [ ]  Frequent urination, [ ]  Difficulty urinating;   Skin: No rashes  Psychological: No history of anxiety,  No history of depression   Physical Examination  Vitals:   05/23/18 1118 05/23/18 1120  BP: 123/67 122/76  Pulse: 83 83  Resp: 16   Temp: (!) 97 F (36.1 C)   TempSrc: Oral   SpO2: 96%   Weight: 195 lb 12.3 oz (88.8 kg)   Height: 5\' 6"  (1.676 m)     Body mass index is 31.6 kg/m.  General:  Alert and oriented, no acute distress HEENT: Normal, normocephalic Neck: No bruit or JVD Pulmonary: Clear to auscultation  bilaterally Cardiac: Regular Rate and Rhythm without murmur Gastrointestinal: Soft, non-tender, non-distended, no mass, no scars Skin: No rash Extremity Pulses:  2+ radial, brachial, palpable pedal pulses Musculoskeletal: No deformity or edema  Neurologic: Upper and lower extremity motor 5/5 grossly and symmetric  DATA:    Summary: Right Carotid: Velocities in the right ICA are consistent with a 1-39% stenosis.  Left Carotid: Velocities in the left ICA are consistent with a 1-39% stenosis.  Vertebrals:  Bilateral vertebral arteries demonstrate antegrade flow. Subclavians: Normal flow hemodynamics were seen in bilateral subclavian  arteries.  ASSESSMENT:   right carotid endarterectomy in June 2017 and left carotid endarterectomy in July 2017 by Dr. Donnetta Hutching.  PLAN: Patent carotid flow with < 39% stenosis.  He will f/u in 1 year for repeat carotid duplex and exam.  If develops problems or concerns he will call sooner.  Roxy Horseman PA-C [facility] Office: [phone]  MD in clinic [place]

## 2018-07-08 DIAGNOSIS — H353231 Exudative age-related macular degeneration, bilateral, with active choroidal neovascularization: Secondary | ICD-10-CM | POA: Diagnosis not present

## 2018-08-11 DIAGNOSIS — Z0000 Encounter for general adult medical examination without abnormal findings: Secondary | ICD-10-CM | POA: Diagnosis not present

## 2018-08-14 ENCOUNTER — Ambulatory Visit: Payer: Medicare Other | Admitting: Internal Medicine

## 2018-09-09 DIAGNOSIS — H353231 Exudative age-related macular degeneration, bilateral, with active choroidal neovascularization: Secondary | ICD-10-CM | POA: Diagnosis not present

## 2018-10-03 DIAGNOSIS — R972 Elevated prostate specific antigen [PSA]: Secondary | ICD-10-CM | POA: Diagnosis not present

## 2018-10-03 DIAGNOSIS — J449 Chronic obstructive pulmonary disease, unspecified: Secondary | ICD-10-CM | POA: Diagnosis not present

## 2018-10-03 DIAGNOSIS — Z720 Tobacco use: Secondary | ICD-10-CM | POA: Diagnosis not present

## 2018-10-03 DIAGNOSIS — I10 Essential (primary) hypertension: Secondary | ICD-10-CM | POA: Diagnosis not present

## 2018-10-03 DIAGNOSIS — F411 Generalized anxiety disorder: Secondary | ICD-10-CM | POA: Diagnosis not present

## 2018-10-03 DIAGNOSIS — Z0000 Encounter for general adult medical examination without abnormal findings: Secondary | ICD-10-CM | POA: Diagnosis not present

## 2018-10-03 DIAGNOSIS — E875 Hyperkalemia: Secondary | ICD-10-CM | POA: Diagnosis not present

## 2018-10-03 DIAGNOSIS — H3530 Unspecified macular degeneration: Secondary | ICD-10-CM | POA: Diagnosis not present

## 2018-10-03 DIAGNOSIS — R944 Abnormal results of kidney function studies: Secondary | ICD-10-CM | POA: Diagnosis not present

## 2018-10-03 DIAGNOSIS — R7301 Impaired fasting glucose: Secondary | ICD-10-CM | POA: Diagnosis not present

## 2018-10-07 DIAGNOSIS — I2510 Atherosclerotic heart disease of native coronary artery without angina pectoris: Secondary | ICD-10-CM | POA: Diagnosis not present

## 2018-10-07 DIAGNOSIS — H4010X0 Unspecified open-angle glaucoma, stage unspecified: Secondary | ICD-10-CM | POA: Diagnosis not present

## 2018-10-07 DIAGNOSIS — I6529 Occlusion and stenosis of unspecified carotid artery: Secondary | ICD-10-CM | POA: Diagnosis not present

## 2018-10-07 DIAGNOSIS — E782 Mixed hyperlipidemia: Secondary | ICD-10-CM | POA: Diagnosis not present

## 2018-10-07 DIAGNOSIS — I10 Essential (primary) hypertension: Secondary | ICD-10-CM | POA: Diagnosis not present

## 2018-10-07 DIAGNOSIS — J449 Chronic obstructive pulmonary disease, unspecified: Secondary | ICD-10-CM | POA: Diagnosis not present

## 2018-10-07 DIAGNOSIS — I350 Nonrheumatic aortic (valve) stenosis: Secondary | ICD-10-CM | POA: Diagnosis not present

## 2018-10-07 DIAGNOSIS — Z720 Tobacco use: Secondary | ICD-10-CM | POA: Diagnosis not present

## 2018-10-07 DIAGNOSIS — H3530 Unspecified macular degeneration: Secondary | ICD-10-CM | POA: Diagnosis not present

## 2018-10-07 DIAGNOSIS — R7301 Impaired fasting glucose: Secondary | ICD-10-CM | POA: Diagnosis not present

## 2018-10-07 DIAGNOSIS — H539 Unspecified visual disturbance: Secondary | ICD-10-CM | POA: Diagnosis not present

## 2018-10-07 DIAGNOSIS — R944 Abnormal results of kidney function studies: Secondary | ICD-10-CM | POA: Diagnosis not present

## 2018-10-24 DIAGNOSIS — H353231 Exudative age-related macular degeneration, bilateral, with active choroidal neovascularization: Secondary | ICD-10-CM | POA: Diagnosis not present

## 2018-11-10 DIAGNOSIS — L570 Actinic keratosis: Secondary | ICD-10-CM | POA: Diagnosis not present

## 2018-11-10 DIAGNOSIS — Z08 Encounter for follow-up examination after completed treatment for malignant neoplasm: Secondary | ICD-10-CM | POA: Diagnosis not present

## 2018-11-10 DIAGNOSIS — X32XXXD Exposure to sunlight, subsequent encounter: Secondary | ICD-10-CM | POA: Diagnosis not present

## 2018-11-10 DIAGNOSIS — C44319 Basal cell carcinoma of skin of other parts of face: Secondary | ICD-10-CM | POA: Diagnosis not present

## 2018-11-10 DIAGNOSIS — Z85828 Personal history of other malignant neoplasm of skin: Secondary | ICD-10-CM | POA: Diagnosis not present

## 2018-11-10 DIAGNOSIS — L820 Inflamed seborrheic keratosis: Secondary | ICD-10-CM | POA: Diagnosis not present

## 2018-11-10 DIAGNOSIS — C4441 Basal cell carcinoma of skin of scalp and neck: Secondary | ICD-10-CM | POA: Diagnosis not present

## 2018-11-10 DIAGNOSIS — D225 Melanocytic nevi of trunk: Secondary | ICD-10-CM | POA: Diagnosis not present

## 2018-12-08 DIAGNOSIS — C44319 Basal cell carcinoma of skin of other parts of face: Secondary | ICD-10-CM | POA: Diagnosis not present

## 2018-12-08 DIAGNOSIS — Z08 Encounter for follow-up examination after completed treatment for malignant neoplasm: Secondary | ICD-10-CM | POA: Diagnosis not present

## 2018-12-08 DIAGNOSIS — Z85828 Personal history of other malignant neoplasm of skin: Secondary | ICD-10-CM | POA: Diagnosis not present

## 2018-12-08 DIAGNOSIS — L820 Inflamed seborrheic keratosis: Secondary | ICD-10-CM | POA: Diagnosis not present

## 2018-12-09 DIAGNOSIS — H353231 Exudative age-related macular degeneration, bilateral, with active choroidal neovascularization: Secondary | ICD-10-CM | POA: Diagnosis not present

## 2019-01-16 ENCOUNTER — Telehealth: Payer: Self-pay | Admitting: *Deleted

## 2019-01-16 ENCOUNTER — Other Ambulatory Visit: Payer: Self-pay | Admitting: *Deleted

## 2019-01-16 DIAGNOSIS — C3491 Malignant neoplasm of unspecified part of right bronchus or lung: Secondary | ICD-10-CM

## 2019-01-16 NOTE — Telephone Encounter (Signed)
Received call from pt's wife requesting pt's labs be done @ [facility] on 02/12/19 as he is having his CT scan done there that day. Call made to [facility] received fax # and lab recquistions were fax'd to [facility] lab. TCT pt's wife and she was notified of the above, Labs scheduled here @ [facility] on 11/20 were cancelled.

## 2019-02-02 ENCOUNTER — Other Ambulatory Visit: Payer: Self-pay | Admitting: Medical Oncology

## 2019-02-02 ENCOUNTER — Telehealth: Payer: Self-pay | Admitting: Medical Oncology

## 2019-02-02 DIAGNOSIS — C3491 Malignant neoplasm of unspecified part of right bronchus or lung: Secondary | ICD-10-CM

## 2019-02-02 NOTE — Telephone Encounter (Signed)
Want labs done at [facility]. Schedule  Message sent and lab orders entered for [facility].

## 2019-02-12 ENCOUNTER — Other Ambulatory Visit: Payer: Self-pay

## 2019-02-12 ENCOUNTER — Ambulatory Visit (HOSPITAL_COMMUNITY)
Admission: RE | Admit: 2019-02-12 | Discharge: 2019-02-12 | Disposition: A | Discharge status: Home / Self Care | Payer: Medicare Other | Source: Ambulatory Visit | Attending: Internal Medicine | Admitting: Internal Medicine

## 2019-02-12 ENCOUNTER — Other Ambulatory Visit (HOSPITAL_COMMUNITY)
Admission: RE | Admit: 2019-02-12 | Discharge: 2019-02-12 | Disposition: A | Discharge status: Home / Self Care | Payer: Medicare Other | Source: Ambulatory Visit | Attending: Internal Medicine | Admitting: Internal Medicine

## 2019-02-12 DIAGNOSIS — R911 Solitary pulmonary nodule: Secondary | ICD-10-CM | POA: Diagnosis not present

## 2019-02-12 DIAGNOSIS — C3490 Malignant neoplasm of unspecified part of unspecified bronchus or lung: Secondary | ICD-10-CM | POA: Diagnosis not present

## 2019-02-12 LAB — COMPREHENSIVE METABOLIC PANEL
ALT: 23 U/L (ref 0–44)
AST: 18 U/L (ref 15–41)
Albumin: 4 g/dL (ref 3.5–5.0)
Alkaline Phosphatase: 69 U/L (ref 38–126)
Anion gap: 10 (ref 5–15)
BUN: 22 mg/dL (ref 8–23)
CO2: 25 mmol/L (ref 22–32)
Calcium: 9.6 mg/dL (ref 8.9–10.3)
Chloride: 105 mmol/L (ref 98–111)
Creatinine, Ser: 1.07 mg/dL (ref 0.61–1.24)
GFR calc Af Amer: >60 mL/min (ref >60)
GFR calc non Af Amer: >60 mL/min (ref >60)
Glucose, Bld: 109 mg/dL — ABNORMAL HIGH (ref 70–99)
Potassium: 4.7 mmol/L (ref 3.5–5.1)
Sodium: 140 mmol/L (ref 135–145)
Total Bilirubin: 0.7 mg/dL (ref 0.3–1.2)
Total Protein: 7.3 g/dL (ref 6.5–8.1)

## 2019-02-12 LAB — CBC WITH DIFFERENTIAL/PLATELET
Abs Immature Granulocytes: 0.01 10*3/uL (ref 0.00–0.07)
Basophils Absolute: 0 10*3/uL (ref 0.0–0.1)
Basophils Relative: 1 %
Eosinophils Absolute: 0.2 10*3/uL (ref 0.0–0.5)
Eosinophils Relative: 3 %
HCT: 44.7 % (ref 39.0–52.0)
Hemoglobin: 14.5 g/dL (ref 13.0–17.0)
Immature Granulocytes: 0 %
Lymphocytes Relative: 26 %
Lymphs Abs: 1.6 10*3/uL (ref 0.7–4.0)
MCH: 32.5 pg (ref 26.0–34.0)
MCHC: 32.4 g/dL (ref 30.0–36.0)
MCV: 100.2 fL — ABNORMAL HIGH (ref 80.0–100.0)
Monocytes Absolute: 0.5 10*3/uL (ref 0.1–1.0)
Monocytes Relative: 9 %
Neutro Abs: 3.8 10*3/uL (ref 1.7–7.7)
Neutrophils Relative %: 61 %
Platelets: 197 10*3/uL (ref 150–400)
RBC: 4.46 MIL/uL (ref 4.22–5.81)
RDW: 12.6 % (ref 11.5–15.5)
WBC: 6.1 10*3/uL (ref 4.0–10.5)
nRBC: 0 % (ref 0.0–0.2)

## 2019-02-12 MED ORDER — IOHEXOL 300 MG/ML  SOLN
75.0000 mL | Freq: Once | INTRAMUSCULAR | Status: AC | PRN
  Administered 2019-02-12: 75 mL via INTRAVENOUS

## 2019-02-13 ENCOUNTER — Ambulatory Visit (HOSPITAL_COMMUNITY): Payer: Medicare Other

## 2019-02-13 ENCOUNTER — Other Ambulatory Visit: Payer: Medicare Other

## 2019-02-13 DIAGNOSIS — H353231 Exudative age-related macular degeneration, bilateral, with active choroidal neovascularization: Secondary | ICD-10-CM | POA: Diagnosis not present

## 2019-02-18 ENCOUNTER — Telehealth: Payer: Self-pay | Admitting: Internal Medicine

## 2019-02-18 NOTE — Telephone Encounter (Signed)
Returned patient's phone call regarding converting 11/30 appointment to virtual visit, spoke with patient's wife and patient is notified.

## 2019-02-23 ENCOUNTER — Encounter: Payer: Self-pay | Admitting: Internal Medicine

## 2019-02-23 ENCOUNTER — Ambulatory Visit: Payer: Medicare Other | Admitting: Internal Medicine

## 2019-02-23 ENCOUNTER — Inpatient Hospital Stay: Payer: Medicare Other | Attending: Internal Medicine | Admitting: Internal Medicine

## 2019-02-23 DIAGNOSIS — C3491 Malignant neoplasm of unspecified part of right bronchus or lung: Secondary | ICD-10-CM

## 2019-02-23 DIAGNOSIS — C3490 Malignant neoplasm of unspecified part of unspecified bronchus or lung: Secondary | ICD-10-CM | POA: Diagnosis not present

## 2019-02-23 NOTE — Progress Notes (Signed)
Eric [phone]   [phone]  PROGRESS NOTE FOR TELEMEDICINE VISITS  Eric Squibb, MD [address] [address]  I connected with@ on 02/23/19 at 11:15 AM EST by Hinton visit and verified that I am speaking with the correct person using two identifiers.   I discussed the limitations, risks, security and privacy concerns of performing an evaluation and management service by telemedicine and the availability of in-person appointments. I also discussed with the patient that there may be a patient responsible charge related to this service. The patient expressed understanding and agreed to proceed.  Other persons participating in the visit and their role in the encounter: His wife  Patient's location:  Home Provider's location: [place].  DIAGNOSIS: Clinically stage IIIA(T1a, N2, M0) non-small cell lung cancer, poorly differentiated squamous cell carcinoma diagnosed in November 2013   PRIOR THERAPY: status post course of concurrent chemoradiation with weekly carboplatin and paclitaxel when the patient was in [place].   CURRENT THERAPY: Observation.  INTERVAL HISTORY: Eric Hinton 78 y.o. male has a Hinton virtual visit with me today for evaluation and discussion of his scan results.  The patient is feeling fine today with no concerning complaints.  He intentionally lost few pounds in the last few months.  He denied having any current chest pain, shortness of breath, cough or hemoptysis.  He denied having any fever or chills.  He has no nausea, vomiting, diarrhea or constipation.  He has no headache or visual changes.  There is no change in his allergy list or medications.  He had repeat CT scan of the chest performed recently and we are having the visit for evaluation and discussion of his discuss results.  MEDICAL HISTORY: Past Medical History:  Diagnosis Date  . Arthritis   . Carotid artery disease  ([place])    Status post bilateral CEA - Dr. Donnetta Hutching  . Constipation   . COPD (chronic obstructive pulmonary disease) ([place])   . DDD (degenerative disc disease), cervical   . Degenerative disc disease, lumbar   . Essential hypertension   . Hyperlipidemia   . Macular degeneration   . Prostate cancer ([place])   . Skin cancer, basal cell   . Squamous cell lung cancer ([place]) 07/15/2015   Status post chemotherapy and XRT - Dr. Julien Nordmann  . Tubular adenoma of colon     ALLERGIES:  is allergic to bupropion.  MEDICATIONS:  Current Outpatient Medications  Medication Sig Dispense Refill  . aspirin 325 MG tablet Take 1 tablet (325 mg total) by mouth daily.    Marland Kitchen atorvastatin (LIPITOR) 10 MG tablet Take 10 mg by mouth daily.    . bevacizumab (AVASTIN) 400 MG/16ML SOLN 1.25 mg every 8 (eight) weeks. Left eye    . Cyanocobalamin (VITAMIN B-12) 2500 MCG SUBL Place 2,500 mcg under the tongue daily.    Marland Kitchen FLUoxetine (PROZAC) 20 MG capsule Take 20 mg by mouth daily.    Marland Kitchen latanoprost (XALATAN) 0.005 % ophthalmic solution Place 1 drop into both eyes at bedtime.    Marland Kitchen menthol-zinc oxide (GOLD BOND) powder Apply 1 application topically daily as needed (for itching).    . Multiple Vitamins-Minerals (PRESERVISION AREDS 2) CAPS Take 1 capsule by mouth 2 (two) times daily.     . naproxen sodium (ANAPROX) 220 MG tablet Take 440 mg by mouth 2 (two) times daily as needed (for pain).    . tamsulosin (FLOMAX) 0.4 MG CAPS capsule TAKE  1 CAPSULE BY MOUTH DAILY 30 capsule 0  . telmisartan (MICARDIS) 80 MG tablet Take 80 mg by mouth daily.     No current facility-administered medications for this visit.     SURGICAL HISTORY:  Past Surgical History:  Procedure Laterality Date  . CATARACT EXTRACTION W/ INTRAOCULAR LENS  IMPLANT, BILATERAL Bilateral   . COLONOSCOPY    . COLONOSCOPY WITH PROPOFOL N/A 05/17/2017   Procedure: COLONOSCOPY WITH PROPOFOL;  Surgeon: Rogene Houston, MD;  [facility];  Service: Endoscopy;   Laterality: N/A;  10:15  . ENDARTERECTOMY Right 08/29/2015   Procedure: RIGHT CAROTID ENDARTERECTOMY WITH PATCH ANGIOPLASTY;  Surgeon: Rosetta Posner, MD;  Location: [facility];  Service: Vascular;  Laterality: Right;  . ENDARTERECTOMY Left 10/07/2015   Procedure: LEFT CAROTID ARTERY ENDARTERECTOMY;  Surgeon: Rosetta Posner, MD;  Location: [facility];  Service: Vascular;  Laterality: Left;  . NOSE SURGERY     "rebulit my nose; related to skin cancer"  . PATCH ANGIOPLASTY Left 10/07/2015   Procedure: With [place];  Surgeon: Rosetta Posner, MD;  Location: [facility];  Service: Vascular;  Laterality: Left;  . POLYPECTOMY  05/17/2017   Procedure: POLYPECTOMY;  Surgeon: Rogene Houston, MD;  [facility];  Service: Endoscopy;;  colon   . PROSTATE BIOPSY    . SKIN CANCER EXCISION Right    neck  . TONSILLECTOMY      REVIEW OF SYSTEMS:  A comprehensive review of systems was negative.    LABORATORY DATA: Lab Results  Component Value Date   WBC 6.1 02/12/2019   HGB 14.5 02/12/2019   HCT 44.7 02/12/2019   MCV 100.2 (H) 02/12/2019   PLT 197 02/12/2019      Chemistry      Component Value Date/Time   NA 140 02/12/2019 0854   NA 139 08/03/2016 1029   K 4.7 02/12/2019 0854   K 4.8 08/03/2016 1029   CL 105 02/12/2019 0854   CO2 25 02/12/2019 0854   CO2 23 08/03/2016 1029   BUN 22 02/12/2019 0854   BUN 19.4 08/03/2016 1029   CREATININE 1.07 02/12/2019 0854   CREATININE 1.1 08/03/2016 1029      Component Value Date/Time   CALCIUM 9.6 02/12/2019 0854   CALCIUM 9.8 08/03/2016 1029   ALKPHOS 69 02/12/2019 0854   ALKPHOS 67 08/03/2016 1029   AST 18 02/12/2019 0854   AST 17 08/03/2016 1029   ALT 23 02/12/2019 0854   ALT 24 08/03/2016 1029   BILITOT 0.7 02/12/2019 0854   BILITOT 0.65 08/03/2016 1029       RADIOGRAPHIC STUDIES: Ct Chest W Contrast  Result Date: 02/12/2019 CLINICAL DATA:  History of lung cancer, non-small cell. Post remote chemo radiotherapy.  EXAM: CT CHEST WITH CONTRAST TECHNIQUE: Multidetector CT imaging of the chest was performed during intravenous contrast administration. CONTRAST:  68mL OMNIPAQUE IOHEXOL 300 MG/ML  SOLN COMPARISON:  02/07/2018 FINDINGS: Cardiovascular: Moderate calcific and noncalcific atherosclerotic changes throughout the thoracic aorta. Calcified coronary artery disease. Aortic valvular and mitral annular calcifications. No significant pericardial effusion. Normal appearance of central pulmonary vasculature. Mediastinum/Nodes: No signs of adenopathy in the chest. Small right hilar lymph node measuring approximately 11 mm in short axis is within 1-2 mm of prior measurement. Small right infrahilar lymph nodes are stable largest approximately 1 cm. Esophagus and thoracic inlet structures are unremarkable. No signs of axillary adenopathy. Lungs/Pleura: Right upper lobe scarring related to prior therapy without change. Background pulmonary  emphysema as before. Small nodule in the right lung base proximally 6 mm unchanged from previous exam. No signs of consolidation or evidence of pleural effusion. Airways are patent. Centrilobular nodularity is unchanged. Upper Abdomen: Signs of hepatic cysts unchanged. No acute process in the upper abdomen. Musculoskeletal: No sign of acute bone finding or destructive bone process. Spinal degenerative changes as before. IMPRESSION: 1. Stable post treatment changes in the right upper lobe. 2. Stable 6 mm nodule in the right lung base. Recommend attention on follow-up. 3. Coronary artery and aortic atherosclerosis. Aortic Atherosclerosis (ICD10-I70.0) and Emphysema (ICD10-J43.9). Electronically Signed   By: Zetta Bills M.D.   On: 02/12/2019 12:38    ASSESSMENT AND PLAN: This is a very pleasant 78 years old white male with history of stage IIIa non-small cell lung cancer diagnosed in November 2013 status post course of concurrent chemoradiation in [place].  The patient is currently on  observation and he is feeling fine with no concerning complaints. He had repeat CT scan of the chest performed recently.  I personally and independently reviewed the scans and discussed the results with the patient today. His scan showed no concerning findings for disease progression. I recommended for the patient to continue on observation with repeat CT scan of the chest in 1 year. He was advised to call immediately if he has any concerning symptoms in the interval. I discussed the assessment and treatment plan with the patient. The patient was provided an opportunity to ask questions and all were answered. The patient agreed with the plan and demonstrated an understanding of the instructions.   The patient was advised to call back or seek an in-person evaluation if the symptoms worsen or if the condition fails to improve as anticipated.  I provided 12 minutes of non face-to-face Hinton visit time during this encounter, and > 50% was spent counseling as documented under my assessment & plan.  Eilleen Kempf, MD 02/23/2019 12:12 PM  Disclaimer: This note was dictated with voice recognition software. Similar sounding words can inadvertently be transcribed and may not be corrected upon review.

## 2019-02-24 ENCOUNTER — Telehealth: Payer: Self-pay | Admitting: Internal Medicine

## 2019-02-24 NOTE — Telephone Encounter (Signed)
Scheduled per los. Called and left msg. Mailed printout  °

## 2019-04-02 DIAGNOSIS — E782 Mixed hyperlipidemia: Secondary | ICD-10-CM | POA: Diagnosis not present

## 2019-04-02 DIAGNOSIS — R7301 Impaired fasting glucose: Secondary | ICD-10-CM | POA: Diagnosis not present

## 2019-04-02 DIAGNOSIS — Z125 Encounter for screening for malignant neoplasm of prostate: Secondary | ICD-10-CM | POA: Diagnosis not present

## 2019-04-02 DIAGNOSIS — I10 Essential (primary) hypertension: Secondary | ICD-10-CM | POA: Diagnosis not present

## 2019-04-06 DIAGNOSIS — Z720 Tobacco use: Secondary | ICD-10-CM | POA: Diagnosis not present

## 2019-04-06 DIAGNOSIS — Z716 Tobacco abuse counseling: Secondary | ICD-10-CM | POA: Diagnosis not present

## 2019-04-06 DIAGNOSIS — E782 Mixed hyperlipidemia: Secondary | ICD-10-CM | POA: Diagnosis not present

## 2019-04-06 DIAGNOSIS — R944 Abnormal results of kidney function studies: Secondary | ICD-10-CM | POA: Diagnosis not present

## 2019-04-06 DIAGNOSIS — I10 Essential (primary) hypertension: Secondary | ICD-10-CM | POA: Diagnosis not present

## 2019-04-06 DIAGNOSIS — I6529 Occlusion and stenosis of unspecified carotid artery: Secondary | ICD-10-CM | POA: Diagnosis not present

## 2019-04-06 DIAGNOSIS — I350 Nonrheumatic aortic (valve) stenosis: Secondary | ICD-10-CM | POA: Diagnosis not present

## 2019-04-06 DIAGNOSIS — I2510 Atherosclerotic heart disease of native coronary artery without angina pectoris: Secondary | ICD-10-CM | POA: Diagnosis not present

## 2019-04-06 DIAGNOSIS — H3530 Unspecified macular degeneration: Secondary | ICD-10-CM | POA: Diagnosis not present

## 2019-04-06 DIAGNOSIS — J449 Chronic obstructive pulmonary disease, unspecified: Secondary | ICD-10-CM | POA: Diagnosis not present

## 2019-04-06 DIAGNOSIS — H4010X0 Unspecified open-angle glaucoma, stage unspecified: Secondary | ICD-10-CM | POA: Diagnosis not present

## 2019-04-06 DIAGNOSIS — R7301 Impaired fasting glucose: Secondary | ICD-10-CM | POA: Diagnosis not present

## 2019-05-12 DIAGNOSIS — H401131 Primary open-angle glaucoma, bilateral, mild stage: Secondary | ICD-10-CM | POA: Diagnosis not present

## 2019-05-12 DIAGNOSIS — H353231 Exudative age-related macular degeneration, bilateral, with active choroidal neovascularization: Secondary | ICD-10-CM | POA: Diagnosis not present

## 2019-05-12 DIAGNOSIS — H35033 Hypertensive retinopathy, bilateral: Secondary | ICD-10-CM | POA: Diagnosis not present

## 2019-05-12 DIAGNOSIS — Z961 Presence of intraocular lens: Secondary | ICD-10-CM | POA: Diagnosis not present

## 2019-05-22 ENCOUNTER — Telehealth (HOSPITAL_COMMUNITY): Payer: Self-pay

## 2019-05-22 NOTE — Telephone Encounter (Signed)

## 2019-05-24 ENCOUNTER — Ambulatory Visit: Payer: Medicare Other | Attending: Internal Medicine

## 2019-05-24 DIAGNOSIS — Z23 Encounter for immunization: Secondary | ICD-10-CM | POA: Insufficient documentation

## 2019-05-24 NOTE — Progress Notes (Signed)
   Covid-19 Vaccination Clinic  Name:  Eric Hinton    MRN: [id number] DOB: 1940/10/20  05/24/2019  [name] was observed post Covid-19 immunization for 15 minutes without incidence. He was provided with Vaccine Information Sheet and instruction to access the V-Safe system.   [name] was instructed to call 911 with any severe reactions post vaccine: Marland Kitchen Difficulty breathing  . Swelling of your face and throat  . A fast heartbeat  . A bad rash all over your body  . Dizziness and weakness    Immunizations Administered    Name Date Dose VIS Date Route   Pfizer COVID-19 Vaccine 05/24/2019  9:07 AM 0.3 mL 03/06/2019 Intramuscular   Manufacturer: [place]   Lot: [id number]   [place]: [id number]

## 2019-05-25 ENCOUNTER — Other Ambulatory Visit: Payer: Self-pay

## 2019-05-25 ENCOUNTER — Ambulatory Visit (INDEPENDENT_AMBULATORY_CARE_PROVIDER_SITE_OTHER): Payer: Medicare Other | Admitting: Physician Assistant

## 2019-05-25 ENCOUNTER — Ambulatory Visit (HOSPITAL_COMMUNITY)
Admission: RE | Admit: 2019-05-25 | Discharge: 2019-05-25 | Disposition: A | Discharge status: Home / Self Care | Payer: Medicare Other | Source: Ambulatory Visit | Attending: Surgery | Admitting: Surgery

## 2019-05-25 VITALS — BP 129/70 | HR 83 | Temp 97.2°F | Resp 18 | Ht 66.0 in | Wt 193.0 lb

## 2019-05-25 DIAGNOSIS — I6523 Occlusion and stenosis of bilateral carotid arteries: Secondary | ICD-10-CM

## 2019-05-25 NOTE — Progress Notes (Signed)
HISTORY AND PHYSICAL     CC:  follow up. Requesting Provider:  [name], MD  HPI: This is a 79 y.o. male here for follow up for carotid artery stenosis.  Pt is s/p right CEA by Dr. Donnetta Hutching on 08/29/2015 and left CEA also by Dr. Donnetta Hutching on 10/07/2015.  He has known partial right eye blindness however sees his ophthalmologist regularly for injection related to macular degeneration glaucoma.  Since last visit he denies any diagnosis of CVA or TIA.  He denies any strokelike symptoms including further vision changes, slurring speech, or one-sided weakness.  He is taking his aspirin and statin daily.  Patient's wife was on speaker phone during today's visit and all questions were answered.  The pt is on a statin for cholesterol management.  The pt is on a daily aspirin.   Other AC:  none The pt is on ARB for hypertension.   The pt is not diabetic.   Tobacco hx:  Current smoker   Past Medical History:  Diagnosis Date  . Arthritis   . Carotid artery disease ([place])    Status post bilateral CEA - Dr. Donnetta Hutching  . Constipation   . COPD (chronic obstructive pulmonary disease) ([place])   . DDD (degenerative disc disease), cervical   . Degenerative disc disease, lumbar   . Essential hypertension   . Hyperlipidemia   . Macular degeneration   . Prostate cancer ([place])   . Skin cancer, basal cell   . Squamous cell lung cancer ([place]) 07/15/2015   Status post chemotherapy and XRT - Dr. Julien Nordmann  . Tubular adenoma of colon     Past Surgical History:  Procedure Laterality Date  . CATARACT EXTRACTION W/ INTRAOCULAR LENS  IMPLANT, BILATERAL Bilateral   . COLONOSCOPY    . COLONOSCOPY WITH PROPOFOL N/A 05/17/2017   Procedure: COLONOSCOPY WITH PROPOFOL;  Surgeon: Rogene Houston, MD;  [facility];  Service: Endoscopy;  Laterality: N/A;  10:15  . ENDARTERECTOMY Right 08/29/2015   Procedure: RIGHT CAROTID ENDARTERECTOMY WITH PATCH ANGIOPLASTY;  Surgeon: Rosetta Posner, MD;  Location: [facility];  Service:  Vascular;  Laterality: Right;  . ENDARTERECTOMY Left 10/07/2015   Procedure: LEFT CAROTID ARTERY ENDARTERECTOMY;  Surgeon: Rosetta Posner, MD;  Location: [facility];  Service: Vascular;  Laterality: Left;  . NOSE SURGERY     "rebulit my nose; related to skin cancer"  . PATCH ANGIOPLASTY Left 10/07/2015   Procedure: With [place];  Surgeon: Rosetta Posner, MD;  Location: [facility];  Service: Vascular;  Laterality: Left;  . POLYPECTOMY  05/17/2017   Procedure: POLYPECTOMY;  Surgeon: Rogene Houston, MD;  [facility];  Service: Endoscopy;;  colon   . PROSTATE BIOPSY    . SKIN CANCER EXCISION Right    neck  . TONSILLECTOMY      Allergies  Allergen Reactions  . Bupropion Hives    Current Outpatient Medications  Medication Sig Dispense Refill  . aspirin 325 MG tablet Take 1 tablet (325 mg total) by mouth daily.    Marland Kitchen atorvastatin (LIPITOR) 10 MG tablet Take 10 mg by mouth daily.    . bevacizumab (AVASTIN) 400 MG/16ML SOLN 1.25 mg every 8 (eight) weeks. Left eye    . Cyanocobalamin (VITAMIN B-12) 2500 MCG SUBL Place 2,500 mcg under the tongue daily.    Marland Kitchen FLUoxetine (PROZAC) 20 MG capsule Take 20 mg by mouth daily.    Marland Kitchen latanoprost (XALATAN) 0.005 % ophthalmic solution Place  1 drop into both eyes at bedtime.    Marland Kitchen menthol-zinc oxide (GOLD BOND) powder Apply 1 application topically daily as needed (for itching).    . Multiple Vitamins-Minerals (PRESERVISION AREDS 2) CAPS Take 1 capsule by mouth 2 (two) times daily.     . naproxen sodium (ANAPROX) 220 MG tablet Take 440 mg by mouth 2 (two) times daily as needed (for pain).    . tamsulosin (FLOMAX) 0.4 MG CAPS capsule TAKE 1 CAPSULE BY MOUTH DAILY 30 capsule 0  . telmisartan (MICARDIS) 80 MG tablet Take 80 mg by mouth daily.     No current facility-administered medications for this visit.    Family History  Problem Relation Age of Onset  . Cancer Mother   . Stroke Father   . Heart disease Father   . Diabetes  Mellitus II Sister     Social History   Socioeconomic History  . Marital status: Married    Spouse name: Not on file  . Number of children: Not on file  . Years of education: Not on file  . Highest education level: Not on file  Occupational History  . Not on file  Tobacco Use  . Smoking status: Current Every Day Smoker    Packs/day: 1.50    Years: 69.00    Pack years: 103.50    Types: Cigarettes    Start date: 04/03/1946  . Smokeless tobacco: Never Used  Substance and Sexual Activity  . Alcohol use: Yes    Alcohol/week: 35.0 standard drinks    Types: 35 Cans of beer per week    Comment: 10/07/2015 "4-6 beers/day"  . Drug use: No  . Sexual activity: Not on file  Other Topics Concern  . Not on file  Social History Narrative  . Not on file   Social Determinants of Health   Financial Resource Strain:   . Difficulty of Paying Living Expenses: Not on file  Food Insecurity:   . Worried About Charity fundraiser in the Last Year: Not on file  . Ran Out of Food in the Last Year: Not on file  Transportation Needs:   . Lack of Transportation (Medical): Not on file  . Lack of Transportation (Non-Medical): Not on file  Physical Activity:   . Days of Exercise per Week: Not on file  . Minutes of Exercise per Session: Not on file  Stress:   . Feeling of Stress : Not on file  Social Connections:   . Frequency of Communication with Friends and Family: Not on file  . Frequency of Social Gatherings with Friends and Family: Not on file  . Attends Religious Services: Not on file  . Active Member of Clubs or Organizations: Not on file  . Attends Archivist Meetings: Not on file  . Marital Status: Not on file  Intimate Partner Violence:   . Fear of Current or Ex-Partner: Not on file  . Emotionally Abused: Not on file  . Physically Abused: Not on file  . Sexually Abused: Not on file     REVIEW OF SYSTEMS:   [X]  denotes positive finding, [ ]  denotes negative  finding Cardiac  Comments:  Chest pain or chest pressure:    Shortness of breath upon exertion:    Short of breath when lying flat:    Irregular heart rhythm:        Vascular    Pain in calf, thigh, or hip brought on by ambulation:    Pain in feet at  night that wakes you up from your sleep:     Blood clot in your veins:    Leg swelling:         Pulmonary    Oxygen at home:    Productive cough:     Wheezing:         Neurologic    Sudden weakness in arms or legs:     Sudden numbness in arms or legs:     Sudden onset of difficulty speaking or slurred speech:    Temporary loss of vision in one eye:     Problems with dizziness:         Gastrointestinal    Blood in stool:     Vomited blood:         Genitourinary    Burning when urinating:     Blood in urine:        Psychiatric    Major depression:         Hematologic    Bleeding problems:    Problems with blood clotting too easily:        Skin    Rashes or ulcers:        Constitutional    Fever or chills:      PHYSICAL EXAMINATION:    General:  WDWN in NAD; vital signs documented above Gait: Not observed HENT: WNL, normocephalic Pulmonary: normal non-labored breathing , without Rales, rhonchi,  wheezing Cardiac: regular HR Abdomen: soft, NT, no masses Skin: without rashes Vascular Exam/Pulses:  Right Left  Radial 2+ (normal) 2+ (normal)  DP 2+ (normal) 2+ (normal)   Extremities: without ischemic changes, without Gangrene , without cellulitis; without open wounds;  Musculoskeletal: no muscle wasting or atrophy  Neurologic: A&O X 3 Psychiatric:  The pt has Normal affect.   Non-Invasive Vascular Imaging:   Carotid Duplex on 05/25/19: Right:  1-39% ICA stenosis Left:  1-39% ICA stenosis   Previous Carotid duplex on 05/23/2018: Right: 1-39% ICA stenosis Left:   1-39% ICA stenosis Vertebrals: Bilateral vertebral arteries demonstrate antegrade flow.  Subclavians: Normal flow hemodynamics were seen in  bilateral subclavian arteries.   ASSESSMENT/PLAN:: 79 y.o. male here for follow up carotid artery stenosis.  is s/p right CEA by Dr. Donnetta Hutching on 08/29/2015 and left CEA also by Dr. Donnetta Hutching on 10/07/2015  -Carotid duplex unchanged over the past year -Bilateral ICA surgery sites without hemodynamically significant stenosis -Continue aspirin and statin daily -Recheck bilateral carotid duplex in 1 year  Dagoberto Ligas, PA-C [facility] [phone]  Clinic MD:  Trula Slade

## 2019-05-26 ENCOUNTER — Other Ambulatory Visit: Payer: Self-pay | Admitting: *Deleted

## 2019-05-26 DIAGNOSIS — I6523 Occlusion and stenosis of bilateral carotid arteries: Secondary | ICD-10-CM

## 2019-06-17 ENCOUNTER — Ambulatory Visit: Payer: Medicare Other | Attending: Internal Medicine

## 2019-06-17 DIAGNOSIS — Z23 Encounter for immunization: Secondary | ICD-10-CM

## 2019-06-17 NOTE — Progress Notes (Signed)
   Covid-19 Vaccination Clinic  Name:  Eric Hinton    MRN: [id number] DOB: [birth year]  06/17/2019  [name] was observed post Covid-19 immunization for 15 minutes without incident. He was provided with Vaccine Information Sheet and instruction to access the V-Safe system.   [name] was instructed to call 911 with any severe reactions post vaccine: Marland Kitchen Difficulty breathing  . Swelling of face and throat  . A fast heartbeat  . A bad rash all over body  . Dizziness and weakness   Immunizations Administered    Name Date Dose VIS Date Route   Pfizer COVID-19 Vaccine 06/17/2019 11:02 AM 0.3 mL 03/06/2019 Intramuscular   Manufacturer: [place]   Lot: [id number]   [place]: [id number]

## 2019-07-28 DIAGNOSIS — H4311 Vitreous hemorrhage, right eye: Secondary | ICD-10-CM | POA: Diagnosis not present

## 2019-07-28 DIAGNOSIS — H401131 Primary open-angle glaucoma, bilateral, mild stage: Secondary | ICD-10-CM | POA: Diagnosis not present

## 2019-07-28 DIAGNOSIS — H353231 Exudative age-related macular degeneration, bilateral, with active choroidal neovascularization: Secondary | ICD-10-CM | POA: Diagnosis not present

## 2019-07-28 DIAGNOSIS — H44111 Panuveitis, right eye: Secondary | ICD-10-CM | POA: Diagnosis not present

## 2019-07-28 DIAGNOSIS — Z961 Presence of intraocular lens: Secondary | ICD-10-CM | POA: Diagnosis not present

## 2019-07-28 DIAGNOSIS — H35033 Hypertensive retinopathy, bilateral: Secondary | ICD-10-CM | POA: Diagnosis not present

## 2019-08-10 DIAGNOSIS — R221 Localized swelling, mass and lump, neck: Secondary | ICD-10-CM | POA: Diagnosis not present

## 2019-08-10 DIAGNOSIS — D487 Neoplasm of uncertain behavior of other specified sites: Secondary | ICD-10-CM | POA: Diagnosis not present

## 2019-08-10 DIAGNOSIS — F17210 Nicotine dependence, cigarettes, uncomplicated: Secondary | ICD-10-CM | POA: Diagnosis not present

## 2019-10-09 DIAGNOSIS — E782 Mixed hyperlipidemia: Secondary | ICD-10-CM | POA: Diagnosis not present

## 2019-10-09 DIAGNOSIS — R7301 Impaired fasting glucose: Secondary | ICD-10-CM | POA: Diagnosis not present

## 2019-10-09 DIAGNOSIS — I10 Essential (primary) hypertension: Secondary | ICD-10-CM | POA: Diagnosis not present

## 2019-10-14 DIAGNOSIS — H3530 Unspecified macular degeneration: Secondary | ICD-10-CM | POA: Diagnosis not present

## 2019-10-14 DIAGNOSIS — H4010X0 Unspecified open-angle glaucoma, stage unspecified: Secondary | ICD-10-CM | POA: Diagnosis not present

## 2019-10-14 DIAGNOSIS — R944 Abnormal results of kidney function studies: Secondary | ICD-10-CM | POA: Diagnosis not present

## 2019-10-14 DIAGNOSIS — I6529 Occlusion and stenosis of unspecified carotid artery: Secondary | ICD-10-CM | POA: Diagnosis not present

## 2019-10-14 DIAGNOSIS — E782 Mixed hyperlipidemia: Secondary | ICD-10-CM | POA: Diagnosis not present

## 2019-10-14 DIAGNOSIS — R7301 Impaired fasting glucose: Secondary | ICD-10-CM | POA: Diagnosis not present

## 2019-10-14 DIAGNOSIS — Z720 Tobacco use: Secondary | ICD-10-CM | POA: Diagnosis not present

## 2019-10-14 DIAGNOSIS — Z716 Tobacco abuse counseling: Secondary | ICD-10-CM | POA: Diagnosis not present

## 2019-10-14 DIAGNOSIS — J449 Chronic obstructive pulmonary disease, unspecified: Secondary | ICD-10-CM | POA: Diagnosis not present

## 2019-10-14 DIAGNOSIS — I350 Nonrheumatic aortic (valve) stenosis: Secondary | ICD-10-CM | POA: Diagnosis not present

## 2019-10-14 DIAGNOSIS — I10 Essential (primary) hypertension: Secondary | ICD-10-CM | POA: Diagnosis not present

## 2019-10-14 DIAGNOSIS — I2510 Atherosclerotic heart disease of native coronary artery without angina pectoris: Secondary | ICD-10-CM | POA: Diagnosis not present

## 2019-10-26 DIAGNOSIS — X32XXXD Exposure to sunlight, subsequent encounter: Secondary | ICD-10-CM | POA: Diagnosis not present

## 2019-10-26 DIAGNOSIS — L570 Actinic keratosis: Secondary | ICD-10-CM | POA: Diagnosis not present

## 2019-10-26 DIAGNOSIS — L820 Inflamed seborrheic keratosis: Secondary | ICD-10-CM | POA: Diagnosis not present

## 2019-10-27 DIAGNOSIS — H353231 Exudative age-related macular degeneration, bilateral, with active choroidal neovascularization: Secondary | ICD-10-CM | POA: Diagnosis not present

## 2019-10-27 DIAGNOSIS — H35033 Hypertensive retinopathy, bilateral: Secondary | ICD-10-CM | POA: Diagnosis not present

## 2019-10-27 DIAGNOSIS — H401131 Primary open-angle glaucoma, bilateral, mild stage: Secondary | ICD-10-CM | POA: Diagnosis not present

## 2019-10-27 DIAGNOSIS — H44111 Panuveitis, right eye: Secondary | ICD-10-CM | POA: Diagnosis not present

## 2019-10-27 DIAGNOSIS — H4311 Vitreous hemorrhage, right eye: Secondary | ICD-10-CM | POA: Diagnosis not present

## 2019-10-27 DIAGNOSIS — Z961 Presence of intraocular lens: Secondary | ICD-10-CM | POA: Diagnosis not present

## 2020-01-19 DIAGNOSIS — H353231 Exudative age-related macular degeneration, bilateral, with active choroidal neovascularization: Secondary | ICD-10-CM | POA: Diagnosis not present

## 2020-01-19 DIAGNOSIS — Z961 Presence of intraocular lens: Secondary | ICD-10-CM | POA: Diagnosis not present

## 2020-01-19 DIAGNOSIS — H35033 Hypertensive retinopathy, bilateral: Secondary | ICD-10-CM | POA: Diagnosis not present

## 2020-01-19 DIAGNOSIS — H4311 Vitreous hemorrhage, right eye: Secondary | ICD-10-CM | POA: Diagnosis not present

## 2020-01-19 DIAGNOSIS — H44111 Panuveitis, right eye: Secondary | ICD-10-CM | POA: Diagnosis not present

## 2020-01-19 DIAGNOSIS — H401131 Primary open-angle glaucoma, bilateral, mild stage: Secondary | ICD-10-CM | POA: Diagnosis not present

## 2020-02-15 ENCOUNTER — Telehealth: Payer: Self-pay | Admitting: Medical Oncology

## 2020-02-15 ENCOUNTER — Inpatient Hospital Stay: Payer: Medicare Other | Attending: Internal Medicine

## 2020-02-15 ENCOUNTER — Encounter (HOSPITAL_COMMUNITY): Payer: Self-pay

## 2020-02-15 ENCOUNTER — Other Ambulatory Visit: Payer: Self-pay

## 2020-02-15 ENCOUNTER — Ambulatory Visit (HOSPITAL_COMMUNITY)
Admission: RE | Admit: 2020-02-15 | Discharge: 2020-02-15 | Disposition: A | Discharge status: Home / Self Care | Payer: Medicare Other | Source: Ambulatory Visit | Attending: Internal Medicine | Admitting: Internal Medicine

## 2020-02-15 DIAGNOSIS — C3490 Malignant neoplasm of unspecified part of unspecified bronchus or lung: Secondary | ICD-10-CM | POA: Insufficient documentation

## 2020-02-15 DIAGNOSIS — I2510 Atherosclerotic heart disease of native coronary artery without angina pectoris: Secondary | ICD-10-CM | POA: Diagnosis not present

## 2020-02-15 DIAGNOSIS — J439 Emphysema, unspecified: Secondary | ICD-10-CM | POA: Diagnosis not present

## 2020-02-15 DIAGNOSIS — J984 Other disorders of lung: Secondary | ICD-10-CM | POA: Diagnosis not present

## 2020-02-15 LAB — CMP (CANCER CENTER ONLY)
ALT: 21 U/L (ref 0–44)
AST: 17 U/L (ref 15–41)
Albumin: 3.9 g/dL (ref 3.5–5.0)
Alkaline Phosphatase: 79 U/L (ref 38–126)
Anion gap: 8 (ref 5–15)
BUN: 11 mg/dL (ref 8–23)
CO2: 26 mmol/L (ref 22–32)
Calcium: 9.9 mg/dL (ref 8.9–10.3)
Chloride: 107 mmol/L (ref 98–111)
Creatinine: 1.05 mg/dL (ref 0.61–1.24)
GFR, Estimated: >60 mL/min (ref >60)
Glucose, Bld: 98 mg/dL (ref 70–99)
Potassium: 4.5 mmol/L (ref 3.5–5.1)
Sodium: 141 mmol/L (ref 135–145)
Total Bilirubin: 0.5 mg/dL (ref 0.3–1.2)
Total Protein: 7.3 g/dL (ref 6.5–8.1)

## 2020-02-15 LAB — CBC WITH DIFFERENTIAL (CANCER CENTER ONLY)
Abs Immature Granulocytes: 0.01 10*3/uL (ref 0.00–0.07)
Basophils Absolute: 0.1 10*3/uL (ref 0.0–0.1)
Basophils Relative: 1 %
Eosinophils Absolute: 0.2 10*3/uL (ref 0.0–0.5)
Eosinophils Relative: 3 %
HCT: 43.6 % (ref 39.0–52.0)
Hemoglobin: 14.7 g/dL (ref 13.0–17.0)
Immature Granulocytes: 0 %
Lymphocytes Relative: 28 %
Lymphs Abs: 1.7 10*3/uL (ref 0.7–4.0)
MCH: 32.4 pg (ref 26.0–34.0)
MCHC: 33.7 g/dL (ref 30.0–36.0)
MCV: 96 fL (ref 80.0–100.0)
Monocytes Absolute: 0.6 10*3/uL (ref 0.1–1.0)
Monocytes Relative: 9 %
Neutro Abs: 3.6 10*3/uL (ref 1.7–7.7)
Neutrophils Relative %: 59 %
Platelet Count: 197 10*3/uL (ref 150–400)
RBC: 4.54 MIL/uL (ref 4.22–5.81)
RDW: 12.4 % (ref 11.5–15.5)
WBC Count: 6.1 10*3/uL (ref 4.0–10.5)
nRBC: 0 % (ref 0.0–0.2)

## 2020-02-15 MED ORDER — IOHEXOL 300 MG/ML  SOLN
75.0000 mL | Freq: Once | INTRAMUSCULAR | Status: AC | PRN
  Administered 2020-02-15: 75 mL via INTRAVENOUS

## 2020-02-15 NOTE — Telephone Encounter (Signed)
02/22/20 -Requests Teleheath visit vs coming in. Schedule message sent.

## 2020-02-16 ENCOUNTER — Telehealth: Payer: Self-pay | Admitting: Internal Medicine

## 2020-02-16 NOTE — Telephone Encounter (Signed)
Changed appt per 11/22 sch msg - left message for with that appt has been changed and a text will be sent to her with the link

## 2020-02-22 ENCOUNTER — Encounter: Payer: Self-pay | Admitting: Internal Medicine

## 2020-02-22 ENCOUNTER — Inpatient Hospital Stay (HOSPITAL_BASED_OUTPATIENT_CLINIC_OR_DEPARTMENT_OTHER): Payer: Medicare Other | Admitting: Internal Medicine

## 2020-02-22 ENCOUNTER — Other Ambulatory Visit: Payer: Self-pay

## 2020-02-22 DIAGNOSIS — C3491 Malignant neoplasm of unspecified part of right bronchus or lung: Secondary | ICD-10-CM

## 2020-02-22 DIAGNOSIS — C3490 Malignant neoplasm of unspecified part of unspecified bronchus or lung: Secondary | ICD-10-CM | POA: Diagnosis not present

## 2020-02-22 DIAGNOSIS — I6523 Occlusion and stenosis of bilateral carotid arteries: Secondary | ICD-10-CM | POA: Diagnosis not present

## 2020-02-22 NOTE — Patient Instructions (Signed)
Steps to Quit Smoking Smoking tobacco is the leading cause of preventable death. It can affect almost every organ in the body. Smoking puts you and people around you at risk for many serious, long-lasting (chronic) diseases. Quitting smoking can be hard, but it is one of the best things that you can do for your health. It is never too late to quit. How do I get ready to quit? When you decide to quit smoking, make a plan to help you succeed. Before you quit:  Pick a date to quit. Set a date within the next 2 weeks to give you time to prepare.  Write down the reasons why you are quitting. Keep this list in places where you will see it often.  Tell your family, friends, and co-workers that you are quitting. Their support is important.  Talk with your doctor about the choices that may help you quit.  Find out if your health insurance will pay for these treatments.  Know the people, places, things, and activities that make you want to smoke (triggers). Avoid them. What first steps can I take to quit smoking?  Throw away all cigarettes at home, at work, and in your car.  Throw away the things that you use when you smoke, such as ashtrays and lighters.  Clean your car. Make sure to empty the ashtray.  Clean your home, including curtains and carpets. What can I do to help me quit smoking? Talk with your doctor about taking medicines and seeing a counselor at the same time. You are more likely to succeed when you do both.  If you are pregnant or breastfeeding, talk with your doctor about counseling or other ways to quit smoking. Do not take medicine to help you quit smoking unless your doctor tells you to do so. To quit smoking: Quit right away  Quit smoking totally, instead of slowly cutting back on how much you smoke over a period of time.  Go to counseling. You are more likely to quit if you go to counseling sessions regularly. Take medicine You may take medicines to help you quit. Some  medicines need a prescription, and some you can buy over-the-counter. Some medicines may contain a drug called nicotine to replace the nicotine in cigarettes. Medicines may:  Help you to stop having the desire to smoke (cravings).  Help to stop the problems that come when you stop smoking (withdrawal symptoms). Your doctor may ask you to use:  Nicotine patches, gum, or lozenges.  Nicotine inhalers or sprays.  Non-nicotine medicine that is taken by mouth. Find resources Find resources and other ways to help you quit smoking and remain smoke-free after you quit. These resources are most helpful when you use them often. They include:  Online chats with a counselor.  Phone quitlines.  Printed self-help materials.  Support groups or group counseling.  Text messaging programs.  Mobile phone apps. Use apps on your mobile phone or tablet that can help you stick to your quit plan. There are many free apps for mobile phones and tablets as well as websites. Examples include Quit Guide from the CDC and smokefree.gov  What things can I do to make it easier to quit?   Talk to your family and friends. Ask them to support and encourage you.  Call a phone quitline (1-800-QUIT-NOW), reach out to support groups, or work with a counselor.  Ask people who smoke to not smoke around you.  Avoid places that make you want to smoke,   such as: ? Bars. ? Parties. ? Smoke-break areas at work.  Spend time with people who do not smoke.  Lower the stress in your life. Stress can make you want to smoke. Try these things to help your stress: ? Getting regular exercise. ? Doing deep-breathing exercises. ? Doing yoga. ? Meditating. ? Doing a body scan. To do this, close your eyes, focus on one area of your body at a time from head to toe. Notice which parts of your body are tense. Try to relax the muscles in those areas. How will I feel when I quit smoking? Day 1 to 3 weeks Within the first 24 hours,  you may start to have some problems that come from quitting tobacco. These problems are very bad 2-3 days after you quit, but they do not often last for more than 2-3 weeks. You may get these symptoms:  Mood swings.  Feeling restless, nervous, angry, or annoyed.  Trouble concentrating.  Dizziness.  Strong desire for high-sugar foods and nicotine.  Weight gain.  Trouble pooping (constipation).  Feeling like you may vomit (nausea).  Coughing or a sore throat.  Changes in how the medicines that you take for other issues work in your body.  Depression.  Trouble sleeping (insomnia). Week 3 and afterward After the first 2-3 weeks of quitting, you may start to notice more positive results, such as:  Better sense of smell and taste.  Less coughing and sore throat.  Slower heart rate.  Lower blood pressure.  Clearer skin.  Better breathing.  Fewer sick days. Quitting smoking can be hard. Do not give up if you fail the first time. Some people need to try a few times before they succeed. Do your best to stick to your quit plan, and talk with your doctor if you have any questions or concerns. Summary  Smoking tobacco is the leading cause of preventable death. Quitting smoking can be hard, but it is one of the best things that you can do for your health.  When you decide to quit smoking, make a plan to help you succeed.  Quit smoking right away, not slowly over a period of time.  When you start quitting, seek help from your doctor, family, or friends. This information is not intended to replace advice given to you by your health care provider. Make sure you discuss any questions you have with your health care provider. Document Revised: 12/05/2018 Document Reviewed: 05/31/2018 Elsevier Patient Education  2020 Elsevier Inc.  

## 2020-02-22 NOTE — Progress Notes (Signed)
[facility] Telephone:([phone]) [phone]   Fax:([phone]) [phone]  PROGRESS NOTE FOR TELEMEDICINE VISITS  [name], MD [place] [address]  I connected with@ on 02/22/20 at 10:30 AM EST by video enabled telemedicine visit and verified that I am speaking with the correct person using two identifiers.   I discussed the limitations, risks, security and privacy concerns of performing an evaluation and management service by telemedicine and the availability of in-person appointments. I also discussed with the patient that there may be a patient responsible charge related to this service. The patient expressed understanding and agreed to proceed.  Other persons participating in the visit and their role in the encounter: His wife  Patient's location:  Home Provider's location: [facility] [place].  DIAGNOSIS:Clinically stage IIIA(T1a, N2, M0) non-small cell lung cancer, poorly differentiated squamous cell carcinoma diagnosed in November 2013   PRIOR THERAPY: status post course of concurrent chemoradiation with weekly carboplatin and paclitaxel when the patient was in [place].   CURRENT THERAPY: Observation.  INTERVAL HISTORY: Eric Hinton 79 y.o. male has a MyChart virtual visit with me today for evaluation and discussion of his discuss results.  The patient is feeling fine with no concerning complaints.  He denied having any changes in his health in the last year.  He has no chest pain, shortness of breath except with exertion with no cough or hemoptysis.  He denied having any fever or chills.  He has no nausea, vomiting, diarrhea or constipation.  He had repeat CT scan of the chest performed recently and we are having the visit for discussion of his scan results.   MEDICAL HISTORY: Past Medical History:  Diagnosis Date  . Arthritis   . Carotid artery disease ([place])    Status post bilateral CEA - Dr. Donnetta Hutching  . Constipation   . COPD (chronic  obstructive pulmonary disease) ([place])   . DDD (degenerative disc disease), cervical   . Degenerative disc disease, lumbar   . Essential hypertension   . Hyperlipidemia   . Macular degeneration   . Prostate cancer ([place])   . Skin cancer, basal cell   . Squamous cell lung cancer ([place]) 07/15/2015   Status post chemotherapy and XRT - Dr. Julien Nordmann  . Tubular adenoma of colon     ALLERGIES:  is allergic to bupropion.  MEDICATIONS:  Current Outpatient Medications  Medication Sig Dispense Refill  . aspirin 325 MG tablet Take 1 tablet (325 mg total) by mouth daily.    Marland Kitchen atorvastatin (LIPITOR) 10 MG tablet Take 10 mg by mouth daily.    . bevacizumab (AVASTIN) 400 MG/16ML SOLN 1.25 mg every 8 (eight) weeks. Left eye    . Cyanocobalamin (VITAMIN B-12) 2500 MCG SUBL Place 2,500 mcg under the tongue daily.    Marland Kitchen FLUoxetine (PROZAC) 20 MG capsule Take 20 mg by mouth daily.    Marland Kitchen latanoprost (XALATAN) 0.005 % ophthalmic solution Place 1 drop into both eyes at bedtime.    Marland Kitchen menthol-zinc oxide (GOLD BOND) powder Apply 1 application topically daily as needed (for itching).    . Multiple Vitamins-Minerals (PRESERVISION AREDS 2) CAPS Take 1 capsule by mouth 2 (two) times daily.     . naproxen sodium (ANAPROX) 220 MG tablet Take 440 mg by mouth 2 (two) times daily as needed (for pain).    . tamsulosin (FLOMAX) 0.4 MG CAPS capsule TAKE 1 CAPSULE BY MOUTH DAILY 30 capsule 0  . telmisartan (MICARDIS) 80 MG tablet  Take 80 mg by mouth daily.     No current facility-administered medications for this visit.    SURGICAL HISTORY:  Past Surgical History:  Procedure Laterality Date  . CATARACT EXTRACTION W/ INTRAOCULAR LENS  IMPLANT, BILATERAL Bilateral   . COLONOSCOPY    . COLONOSCOPY WITH PROPOFOL N/A 05/17/2017   Procedure: COLONOSCOPY WITH PROPOFOL;  Surgeon: Rogene Houston, MD;  [facility];  Service: Endoscopy;  Laterality: N/A;  10:15  . ENDARTERECTOMY Right 08/29/2015   Procedure: RIGHT CAROTID  ENDARTERECTOMY WITH PATCH ANGIOPLASTY;  Surgeon: Rosetta Posner, MD;  Location: [facility];  Service: Vascular;  Laterality: Right;  . ENDARTERECTOMY Left 10/07/2015   Procedure: LEFT CAROTID ARTERY ENDARTERECTOMY;  Surgeon: Rosetta Posner, MD;  Location: [facility];  Service: Vascular;  Laterality: Left;  . NOSE SURGERY     "rebulit my nose; related to skin cancer"  . PATCH ANGIOPLASTY Left 10/07/2015   Procedure: With [place];  Surgeon: Rosetta Posner, MD;  Location: [facility];  Service: Vascular;  Laterality: Left;  . POLYPECTOMY  05/17/2017   Procedure: POLYPECTOMY;  Surgeon: Rogene Houston, MD;  [facility];  Service: Endoscopy;;  colon   . PROSTATE BIOPSY    . SKIN CANCER EXCISION Right    neck  . TONSILLECTOMY      REVIEW OF SYSTEMS:  A comprehensive review of systems was negative except for: Respiratory: positive for dyspnea on exertion   LABORATORY DATA: Lab Results  Component Value Date   WBC 6.1 02/15/2020   HGB 14.7 02/15/2020   HCT 43.6 02/15/2020   MCV 96.0 02/15/2020   PLT 197 02/15/2020      Chemistry      Component Value Date/Time   NA 141 02/15/2020 1106   NA 139 08/03/2016 1029   K 4.5 02/15/2020 1106   K 4.8 08/03/2016 1029   CL 107 02/15/2020 1106   CO2 26 02/15/2020 1106   CO2 23 08/03/2016 1029   BUN 11 02/15/2020 1106   BUN 19.4 08/03/2016 1029   CREATININE 1.05 02/15/2020 1106   CREATININE 1.1 08/03/2016 1029      Component Value Date/Time   CALCIUM 9.9 02/15/2020 1106   CALCIUM 9.8 08/03/2016 1029   ALKPHOS 79 02/15/2020 1106   ALKPHOS 67 08/03/2016 1029   AST 17 02/15/2020 1106   AST 17 08/03/2016 1029   ALT 21 02/15/2020 1106   ALT 24 08/03/2016 1029   BILITOT 0.5 02/15/2020 1106   BILITOT 0.65 08/03/2016 1029       RADIOGRAPHIC STUDIES: CT Chest W Contrast  Result Date: 02/15/2020 CLINICAL DATA:  Non-small cell lung cancer restaging. EXAM: CT CHEST WITH CONTRAST TECHNIQUE: Multidetector CT imaging of the  chest was performed during intravenous contrast administration. CONTRAST:  14mL OMNIPAQUE IOHEXOL 300 MG/ML  SOLN COMPARISON:  Multiple prior CT scans dating back to 2018. The most recent is 02/12/2019 FINDINGS: Cardiovascular: The heart is normal in size. No pericardial effusion. Stable mild tortuosity, ectasia and moderate calcification of the thoracic aorta. No focal aneurysm or dissection. Branch vessel calcifications are stable including three-vessel coronary artery calcifications. Mediastinum/Nodes: Stable mediastinal and hilar lymph nodes. No new or progressive findings. 13 mm right hilar node on image 61/2 is unchanged. 9 mm right infrahilar lymph node on image 79/2 is unchanged. Left infrahilar lymph node on image 75/2 measures 7.5 mm and is stable. Lungs/Pleura: Stable right upper lobe scarring changes likely related to radiation fibrosis. Slightly more inferiorly  there is a band of subpleural scarring in the anterior aspect of the right middle lobe. Stable 6 mm nodule in the right lower lobe on image 83/5. Stable underlying emphysematous changes and pulmonary scarring. No superimposed infiltrates or effusions. Upper Abdomen: Stable hepatic and renal cysts. No worrisome hepatic or adrenal gland lesions. Stable aortic and branch vessel calcifications. Musculoskeletal: No chest wall mass, supraclavicular or axillary adenopathy. Stable 12.5 mm right thyroid nodule. Not clinically significant; no follow-up imaging recommended (ref: J Am Coll Radiol. 2015 Feb;12(2): 143-50). No significant bony findings. IMPRESSION: 1. Stable right upper lobe scarring changes likely related to radiation fibrosis. 2. Stable 6 mm right lower lobe pulmonary nodule. 3. Stable mediastinal and hilar lymph nodes. No new or progressive findings. 4. Stable emphysematous changes and pulmonary scarring. 5. Stable advanced atherosclerotic calcifications involving the thoracic and abdominal aorta and branch vessels including three-vessel  coronary artery calcifications. 6. Stable hepatic and renal cysts. 7. Emphysema and aortic atherosclerosis. Aortic Atherosclerosis (ICD10-I70.0) and Emphysema (ICD10-J43.9). Electronically Signed   By: Marijo Sanes M.D.   On: 02/15/2020 16:19    ASSESSMENT AND PLAN: This is a very pleasant 79 years old white male with history of stage IIIa non-small cell lung cancer diagnosed in November 2013 status post course of concurrent chemoradiation in [place]. The patient is currently on observation and he is feeling fine today with no concerning complaints. The patient had repeat CT scan of the chest performed recently.  I personally and independently reviewed the scan and discussed the results with the patient and his wife today. His scan showed no concerning findings for disease recurrence or metastasis. I recommended for him to continue on observation with repeat CT scan of the chest in 1 year. The patient was advised to call immediately if he has any concerning symptoms in the interval. I discussed the assessment and treatment plan with the patient. The patient was provided an opportunity to ask questions and all were answered. The patient agreed with the plan and demonstrated an understanding of the instructions.   The patient was advised to call back or seek an in-person evaluation if the symptoms worsen or if the condition fails to improve as anticipated.  I provided 12 minutes of face-to-face video visit time during this encounter, and > 50% was spent counseling as documented under my assessment & plan.  Eilleen Kempf, MD 02/22/2020 9:28 AM  Disclaimer: This note was dictated with voice recognition software. Similar sounding words can inadvertently be transcribed and may not be corrected upon review.

## 2020-02-25 DIAGNOSIS — C44319 Basal cell carcinoma of skin of other parts of face: Secondary | ICD-10-CM | POA: Diagnosis not present

## 2020-02-25 DIAGNOSIS — L308 Other specified dermatitis: Secondary | ICD-10-CM | POA: Diagnosis not present

## 2020-02-25 DIAGNOSIS — R208 Other disturbances of skin sensation: Secondary | ICD-10-CM | POA: Diagnosis not present

## 2020-03-28 DIAGNOSIS — X32XXXD Exposure to sunlight, subsequent encounter: Secondary | ICD-10-CM | POA: Diagnosis not present

## 2020-03-28 DIAGNOSIS — Z08 Encounter for follow-up examination after completed treatment for malignant neoplasm: Secondary | ICD-10-CM | POA: Diagnosis not present

## 2020-03-28 DIAGNOSIS — L570 Actinic keratosis: Secondary | ICD-10-CM | POA: Diagnosis not present

## 2020-03-28 DIAGNOSIS — Z85828 Personal history of other malignant neoplasm of skin: Secondary | ICD-10-CM | POA: Diagnosis not present

## 2020-05-03 DIAGNOSIS — H353231 Exudative age-related macular degeneration, bilateral, with active choroidal neovascularization: Secondary | ICD-10-CM | POA: Diagnosis not present

## 2020-05-03 DIAGNOSIS — H401131 Primary open-angle glaucoma, bilateral, mild stage: Secondary | ICD-10-CM | POA: Diagnosis not present

## 2020-05-03 DIAGNOSIS — Z961 Presence of intraocular lens: Secondary | ICD-10-CM | POA: Diagnosis not present

## 2020-05-03 DIAGNOSIS — H4311 Vitreous hemorrhage, right eye: Secondary | ICD-10-CM | POA: Diagnosis not present

## 2020-05-03 DIAGNOSIS — H35033 Hypertensive retinopathy, bilateral: Secondary | ICD-10-CM | POA: Diagnosis not present

## 2020-05-03 DIAGNOSIS — H44111 Panuveitis, right eye: Secondary | ICD-10-CM | POA: Diagnosis not present

## 2020-06-10 ENCOUNTER — Ambulatory Visit (INDEPENDENT_AMBULATORY_CARE_PROVIDER_SITE_OTHER): Payer: Medicare Other | Admitting: Physician Assistant

## 2020-06-10 ENCOUNTER — Other Ambulatory Visit: Payer: Self-pay

## 2020-06-10 ENCOUNTER — Ambulatory Visit (HOSPITAL_COMMUNITY)
Admission: RE | Admit: 2020-06-10 | Discharge: 2020-06-10 | Disposition: A | Discharge status: Home / Self Care | Payer: Medicare Other | Source: Ambulatory Visit | Attending: Vascular Surgery | Admitting: Vascular Surgery

## 2020-06-10 VITALS — BP 132/74 | HR 79 | Resp 18 | Ht 60.5 in | Wt 198.0 lb

## 2020-06-10 DIAGNOSIS — I6523 Occlusion and stenosis of bilateral carotid arteries: Secondary | ICD-10-CM | POA: Insufficient documentation

## 2020-06-10 NOTE — Progress Notes (Signed)
Established Carotid Patient   History of Present Illness   Eric Hinton is a 80 y.o. ([birth year]) male who presents for surveillance of carotid artery stenosis.  He is status post right carotid endarterectomy on 08/29/2015 and left carotid endarterectomy on 10/07/2015 both by Dr. Donnetta Hutching.  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  Since last office visit 1 year ago he denies any diagnosis of CVA or TIA.  He also denies any strokelike symptoms including slurring speech, further vision changes, or one-sided weakness.  He is taking an aspirin and a statin daily.  He is still an every day tobacco smoker.  He sees his PCP annually for management of chronic medical conditions including hypertension and hyperlipidemia.  The patient's PMH, PSH, SH, and FamHx were reviewed and are unchanged from prior visit.  Current Outpatient Medications  Medication Sig Dispense Refill  . aspirin 325 MG tablet Take 1 tablet (325 mg total) by mouth daily.    Marland Kitchen atorvastatin (LIPITOR) 10 MG tablet Take 10 mg by mouth daily.    . bevacizumab (AVASTIN) 400 MG/16ML SOLN 1.25 mg every 8 (eight) weeks. Left eye    . Cyanocobalamin (VITAMIN B-12) 2500 MCG SUBL Place 2,500 mcg under the tongue daily.    Marland Kitchen FLUoxetine (PROZAC) 20 MG capsule Take 20 mg by mouth daily.    Marland Kitchen latanoprost (XALATAN) 0.005 % ophthalmic solution Place 1 drop into both eyes at bedtime.    Marland Kitchen menthol-zinc oxide (GOLD BOND) powder Apply 1 application topically daily as needed (for itching).    . Multiple Vitamins-Minerals (PRESERVISION AREDS 2) CAPS Take 1 capsule by mouth 2 (two) times daily.     . naproxen sodium (ANAPROX) 220 MG tablet Take 440 mg by mouth 2 (two) times daily as needed (for pain).    . tamsulosin (FLOMAX) 0.4 MG CAPS capsule TAKE 1 CAPSULE BY MOUTH DAILY 30 capsule 0  . telmisartan (MICARDIS) 80 MG tablet Take 80 mg by mouth daily.     No current facility-administered medications for this visit.     REVIEW OF SYSTEMS (negative unless checked):   Cardiac:  []  Chest pain or chest pressure? []  Shortness of breath upon activity? []  Shortness of breath when lying flat? []  Irregular heart rhythm?  Vascular:  []  Pain in calf, thigh, or hip brought on by walking? []  Pain in feet at night that wakes you up from your sleep? []  Blood clot in your veins? []  Leg swelling?  Pulmonary:  []  Oxygen at home? []  Productive cough? []  Wheezing?  Neurologic:  []  Sudden weakness in arms or legs? []  Sudden numbness in arms or legs? []  Sudden onset of difficult speaking or slurred speech? []  Temporary loss of vision in one eye? []  Problems with dizziness?  Gastrointestinal:  []  Blood in stool? []  Vomited blood?  Genitourinary:  []  Burning when urinating? []  Blood in urine?  Psychiatric:  []  Major depression  Hematologic:  []  Bleeding problems? []  Problems with blood clotting?  Dermatologic:  []  Rashes or ulcers?  Constitutional:  []  Fever or chills?  Ear/Nose/Throat:  []  Change in hearing? []  Nose bleeds? []  Sore throat?  Musculoskeletal:  []  Back pain? []  Joint pain? []  Muscle pain?   Physical Examination   Vitals:   06/10/20 1215 06/10/20 1217  BP: 128/72 132/74  Pulse: 78 79  Resp: 18   Weight: 198 lb (89.8 kg)   Height: 5' 0.5" (1.537 m)    Body mass index is  38.03 kg/m.  General:  WDWN in NAD; vital signs documented above Gait: Not observed HENT: WNL, normocephalic Pulmonary: normal non-labored breathing , without Rales, rhonchi,  wheezing Cardiac: regular HR Abdomen: soft, NT, no masses Skin: without rashes Extremities: without ischemic changes, without Gangrene , without cellulitis; without open wounds;  Musculoskeletal: no muscle wasting or atrophy  Neurologic: A&O X 3;  CN grossly intact Psychiatric:  The pt has Normal affect.  Non-Invasive Vascular Imaging   B Carotid Duplex :   R ICA stenosis:  1-39%  R VA:  patent and  antegrade  L ICA stenosis:  1-39%  L VA:  patent and antegrade   Medical Decision Making   [name] is a 80 y.o. male who presents for surveillance of carotid artery stenosis with history of bilateral carotid endarterectomy   Carotid duplex unchanged over the past year demonstrating widely patent carotid endarterectomy sites without recurrent stenosis  Continue aspirin and statin daily  Encouraged smoking cessation  Recheck carotid duplex in 1 year   Dagoberto Ligas PA-C [facility] Office: [phone]  Clinic MD: Donzetta Matters

## 2020-06-23 DIAGNOSIS — R7301 Impaired fasting glucose: Secondary | ICD-10-CM | POA: Diagnosis not present

## 2020-06-23 DIAGNOSIS — Z6830 Body mass index (BMI) 30.0-30.9, adult: Secondary | ICD-10-CM | POA: Diagnosis not present

## 2020-06-23 DIAGNOSIS — I10 Essential (primary) hypertension: Secondary | ICD-10-CM | POA: Diagnosis not present

## 2020-06-23 DIAGNOSIS — R944 Abnormal results of kidney function studies: Secondary | ICD-10-CM | POA: Diagnosis not present

## 2020-06-23 DIAGNOSIS — R972 Elevated prostate specific antigen [PSA]: Secondary | ICD-10-CM | POA: Diagnosis not present

## 2020-06-23 DIAGNOSIS — C61 Malignant neoplasm of prostate: Secondary | ICD-10-CM | POA: Diagnosis not present

## 2020-06-23 DIAGNOSIS — J449 Chronic obstructive pulmonary disease, unspecified: Secondary | ICD-10-CM | POA: Diagnosis not present

## 2020-06-23 DIAGNOSIS — E782 Mixed hyperlipidemia: Secondary | ICD-10-CM | POA: Diagnosis not present

## 2020-06-23 DIAGNOSIS — E875 Hyperkalemia: Secondary | ICD-10-CM | POA: Diagnosis not present

## 2020-06-23 DIAGNOSIS — I350 Nonrheumatic aortic (valve) stenosis: Secondary | ICD-10-CM | POA: Diagnosis not present

## 2020-06-28 DIAGNOSIS — J449 Chronic obstructive pulmonary disease, unspecified: Secondary | ICD-10-CM | POA: Diagnosis not present

## 2020-06-28 DIAGNOSIS — R944 Abnormal results of kidney function studies: Secondary | ICD-10-CM | POA: Diagnosis not present

## 2020-06-28 DIAGNOSIS — R7301 Impaired fasting glucose: Secondary | ICD-10-CM | POA: Diagnosis not present

## 2020-06-28 DIAGNOSIS — I6529 Occlusion and stenosis of unspecified carotid artery: Secondary | ICD-10-CM | POA: Diagnosis not present

## 2020-06-28 DIAGNOSIS — H4010X0 Unspecified open-angle glaucoma, stage unspecified: Secondary | ICD-10-CM | POA: Diagnosis not present

## 2020-06-28 DIAGNOSIS — E782 Mixed hyperlipidemia: Secondary | ICD-10-CM | POA: Diagnosis not present

## 2020-06-28 DIAGNOSIS — I350 Nonrheumatic aortic (valve) stenosis: Secondary | ICD-10-CM | POA: Diagnosis not present

## 2020-06-28 DIAGNOSIS — I10 Essential (primary) hypertension: Secondary | ICD-10-CM | POA: Diagnosis not present

## 2020-06-28 DIAGNOSIS — I2510 Atherosclerotic heart disease of native coronary artery without angina pectoris: Secondary | ICD-10-CM | POA: Diagnosis not present

## 2020-06-28 DIAGNOSIS — H3530 Unspecified macular degeneration: Secondary | ICD-10-CM | POA: Diagnosis not present

## 2020-06-28 DIAGNOSIS — F17200 Nicotine dependence, unspecified, uncomplicated: Secondary | ICD-10-CM | POA: Diagnosis not present

## 2020-06-28 DIAGNOSIS — Z716 Tobacco abuse counseling: Secondary | ICD-10-CM | POA: Diagnosis not present

## 2020-07-26 DIAGNOSIS — H44111 Panuveitis, right eye: Secondary | ICD-10-CM | POA: Diagnosis not present

## 2020-07-26 DIAGNOSIS — I10 Essential (primary) hypertension: Secondary | ICD-10-CM | POA: Diagnosis not present

## 2020-07-26 DIAGNOSIS — H401131 Primary open-angle glaucoma, bilateral, mild stage: Secondary | ICD-10-CM | POA: Diagnosis not present

## 2020-07-26 DIAGNOSIS — Z961 Presence of intraocular lens: Secondary | ICD-10-CM | POA: Diagnosis not present

## 2020-07-26 DIAGNOSIS — H353231 Exudative age-related macular degeneration, bilateral, with active choroidal neovascularization: Secondary | ICD-10-CM | POA: Diagnosis not present

## 2020-07-26 DIAGNOSIS — H35033 Hypertensive retinopathy, bilateral: Secondary | ICD-10-CM | POA: Diagnosis not present

## 2020-07-26 DIAGNOSIS — H4311 Vitreous hemorrhage, right eye: Secondary | ICD-10-CM | POA: Diagnosis not present

## 2020-10-18 DIAGNOSIS — H353231 Exudative age-related macular degeneration, bilateral, with active choroidal neovascularization: Secondary | ICD-10-CM | POA: Diagnosis not present

## 2020-10-18 DIAGNOSIS — H44111 Panuveitis, right eye: Secondary | ICD-10-CM | POA: Diagnosis not present

## 2020-10-18 DIAGNOSIS — H4311 Vitreous hemorrhage, right eye: Secondary | ICD-10-CM | POA: Diagnosis not present

## 2020-10-18 DIAGNOSIS — H35033 Hypertensive retinopathy, bilateral: Secondary | ICD-10-CM | POA: Diagnosis not present

## 2020-10-18 DIAGNOSIS — H401131 Primary open-angle glaucoma, bilateral, mild stage: Secondary | ICD-10-CM | POA: Diagnosis not present

## 2020-10-18 DIAGNOSIS — Z961 Presence of intraocular lens: Secondary | ICD-10-CM | POA: Diagnosis not present

## 2020-11-07 DIAGNOSIS — D1801 Hemangioma of skin and subcutaneous tissue: Secondary | ICD-10-CM | POA: Diagnosis not present

## 2020-11-07 DIAGNOSIS — I781 Nevus, non-neoplastic: Secondary | ICD-10-CM | POA: Diagnosis not present

## 2020-11-07 DIAGNOSIS — L820 Inflamed seborrheic keratosis: Secondary | ICD-10-CM | POA: Diagnosis not present

## 2020-11-07 DIAGNOSIS — X32XXXD Exposure to sunlight, subsequent encounter: Secondary | ICD-10-CM | POA: Diagnosis not present

## 2020-11-07 DIAGNOSIS — Z1283 Encounter for screening for malignant neoplasm of skin: Secondary | ICD-10-CM | POA: Diagnosis not present

## 2020-11-07 DIAGNOSIS — L570 Actinic keratosis: Secondary | ICD-10-CM | POA: Diagnosis not present

## 2020-11-07 DIAGNOSIS — D225 Melanocytic nevi of trunk: Secondary | ICD-10-CM | POA: Diagnosis not present

## 2020-12-13 DIAGNOSIS — H35033 Hypertensive retinopathy, bilateral: Secondary | ICD-10-CM | POA: Diagnosis not present

## 2020-12-13 DIAGNOSIS — H44111 Panuveitis, right eye: Secondary | ICD-10-CM | POA: Diagnosis not present

## 2020-12-13 DIAGNOSIS — H401131 Primary open-angle glaucoma, bilateral, mild stage: Secondary | ICD-10-CM | POA: Diagnosis not present

## 2020-12-13 DIAGNOSIS — H4311 Vitreous hemorrhage, right eye: Secondary | ICD-10-CM | POA: Diagnosis not present

## 2020-12-13 DIAGNOSIS — H353231 Exudative age-related macular degeneration, bilateral, with active choroidal neovascularization: Secondary | ICD-10-CM | POA: Diagnosis not present

## 2020-12-13 DIAGNOSIS — Z961 Presence of intraocular lens: Secondary | ICD-10-CM | POA: Diagnosis not present

## 2021-01-06 DIAGNOSIS — C801 Malignant (primary) neoplasm, unspecified: Secondary | ICD-10-CM | POA: Diagnosis not present

## 2021-01-06 DIAGNOSIS — Z125 Encounter for screening for malignant neoplasm of prostate: Secondary | ICD-10-CM | POA: Diagnosis not present

## 2021-01-06 DIAGNOSIS — I10 Essential (primary) hypertension: Secondary | ICD-10-CM | POA: Diagnosis not present

## 2021-01-06 DIAGNOSIS — R7301 Impaired fasting glucose: Secondary | ICD-10-CM | POA: Diagnosis not present

## 2021-01-11 DIAGNOSIS — I998 Other disorder of circulatory system: Secondary | ICD-10-CM | POA: Diagnosis not present

## 2021-01-11 DIAGNOSIS — Z720 Tobacco use: Secondary | ICD-10-CM | POA: Diagnosis not present

## 2021-01-11 DIAGNOSIS — I2510 Atherosclerotic heart disease of native coronary artery without angina pectoris: Secondary | ICD-10-CM | POA: Diagnosis not present

## 2021-01-11 DIAGNOSIS — R7301 Impaired fasting glucose: Secondary | ICD-10-CM | POA: Diagnosis not present

## 2021-01-11 DIAGNOSIS — Z85118 Personal history of other malignant neoplasm of bronchus and lung: Secondary | ICD-10-CM | POA: Diagnosis not present

## 2021-01-11 DIAGNOSIS — Z0001 Encounter for general adult medical examination with abnormal findings: Secondary | ICD-10-CM | POA: Diagnosis not present

## 2021-01-11 DIAGNOSIS — R944 Abnormal results of kidney function studies: Secondary | ICD-10-CM | POA: Diagnosis not present

## 2021-01-11 DIAGNOSIS — J449 Chronic obstructive pulmonary disease, unspecified: Secondary | ICD-10-CM | POA: Diagnosis not present

## 2021-01-11 DIAGNOSIS — Z8546 Personal history of malignant neoplasm of prostate: Secondary | ICD-10-CM | POA: Diagnosis not present

## 2021-01-11 DIAGNOSIS — I10 Essential (primary) hypertension: Secondary | ICD-10-CM | POA: Diagnosis not present

## 2021-01-11 DIAGNOSIS — E782 Mixed hyperlipidemia: Secondary | ICD-10-CM | POA: Diagnosis not present

## 2021-01-11 DIAGNOSIS — H3530 Unspecified macular degeneration: Secondary | ICD-10-CM | POA: Diagnosis not present

## 2021-01-25 DIAGNOSIS — Z23 Encounter for immunization: Secondary | ICD-10-CM | POA: Diagnosis not present

## 2021-02-14 ENCOUNTER — Telehealth: Payer: Self-pay | Admitting: Medical Oncology

## 2021-02-14 DIAGNOSIS — C3490 Malignant neoplasm of unspecified part of unspecified bronchus or lung: Secondary | ICD-10-CM

## 2021-02-14 NOTE — Telephone Encounter (Signed)
Confirmed lab appt 11/28 at [facility]

## 2021-02-20 ENCOUNTER — Other Ambulatory Visit: Payer: Self-pay

## 2021-02-20 ENCOUNTER — Ambulatory Visit (HOSPITAL_COMMUNITY)
Admission: RE | Admit: 2021-02-20 | Discharge: 2021-02-20 | Disposition: A | Discharge status: Home / Self Care | Payer: Medicare Other | Source: Ambulatory Visit | Attending: Internal Medicine | Admitting: Internal Medicine

## 2021-02-20 ENCOUNTER — Other Ambulatory Visit (HOSPITAL_COMMUNITY)
Admission: RE | Admit: 2021-02-20 | Discharge: 2021-02-20 | Disposition: A | Discharge status: Home / Self Care | Payer: Medicare Other | Source: Ambulatory Visit | Attending: Internal Medicine | Admitting: Internal Medicine

## 2021-02-20 DIAGNOSIS — I700 Atherosclerosis of aorta: Secondary | ICD-10-CM | POA: Diagnosis not present

## 2021-02-20 DIAGNOSIS — R918 Other nonspecific abnormal finding of lung field: Secondary | ICD-10-CM | POA: Diagnosis not present

## 2021-02-20 DIAGNOSIS — J439 Emphysema, unspecified: Secondary | ICD-10-CM | POA: Diagnosis not present

## 2021-02-20 DIAGNOSIS — R911 Solitary pulmonary nodule: Secondary | ICD-10-CM | POA: Diagnosis not present

## 2021-02-20 DIAGNOSIS — C3490 Malignant neoplasm of unspecified part of unspecified bronchus or lung: Secondary | ICD-10-CM | POA: Insufficient documentation

## 2021-02-20 LAB — COMPREHENSIVE METABOLIC PANEL WITH GFR
ALT: 19 U/L (ref 0–44)
AST: 16 U/L (ref 15–41)
Albumin: 4.1 g/dL (ref 3.5–5.0)
Alkaline Phosphatase: 63 U/L (ref 38–126)
Anion gap: 9 (ref 5–15)
BUN: 17 mg/dL (ref 8–23)
CO2: 24 mmol/L (ref 22–32)
Calcium: 9.3 mg/dL (ref 8.9–10.3)
Chloride: 103 mmol/L (ref 98–111)
Creatinine, Ser: 1.02 mg/dL (ref 0.61–1.24)
GFR, Estimated: >60 mL/min
Glucose, Bld: 105 mg/dL — ABNORMAL HIGH (ref 70–99)
Potassium: 4 mmol/L (ref 3.5–5.1)
Sodium: 136 mmol/L (ref 135–145)
Total Bilirubin: 0.4 mg/dL (ref 0.3–1.2)
Total Protein: 7.2 g/dL (ref 6.5–8.1)

## 2021-02-20 LAB — CBC WITH DIFFERENTIAL/PLATELET
Abs Immature Granulocytes: 0.02 10*3/uL (ref 0.00–0.07)
Basophils Absolute: 0 10*3/uL (ref 0.0–0.1)
Basophils Relative: 1 %
Eosinophils Absolute: 0.1 10*3/uL (ref 0.0–0.5)
Eosinophils Relative: 3 %
HCT: 46 % (ref 39.0–52.0)
Hemoglobin: 15.4 g/dL (ref 13.0–17.0)
Immature Granulocytes: 0 %
Lymphocytes Relative: 30 %
Lymphs Abs: 1.5 10*3/uL (ref 0.7–4.0)
MCH: 32.4 pg (ref 26.0–34.0)
MCHC: 33.5 g/dL (ref 30.0–36.0)
MCV: 96.8 fL (ref 80.0–100.0)
Monocytes Absolute: 0.5 10*3/uL (ref 0.1–1.0)
Monocytes Relative: 10 %
Neutro Abs: 2.8 10*3/uL (ref 1.7–7.7)
Neutrophils Relative %: 56 %
Platelets: 173 10*3/uL (ref 150–400)
RBC: 4.75 MIL/uL (ref 4.22–5.81)
RDW: 12.6 % (ref 11.5–15.5)
WBC: 5 10*3/uL (ref 4.0–10.5)
nRBC: 0 % (ref 0.0–0.2)

## 2021-02-20 MED ORDER — IOHEXOL 300 MG/ML  SOLN
75.0000 mL | Freq: Once | INTRAMUSCULAR | Status: AC | PRN
  Administered 2021-02-20: 12:00:00 75 mL via INTRAVENOUS

## 2021-02-23 ENCOUNTER — Inpatient Hospital Stay: Payer: Medicare Other | Admitting: Physician Assistant

## 2021-03-08 DIAGNOSIS — Z85118 Personal history of other malignant neoplasm of bronchus and lung: Secondary | ICD-10-CM | POA: Diagnosis not present

## 2021-03-08 DIAGNOSIS — K029 Dental caries, unspecified: Secondary | ICD-10-CM | POA: Diagnosis not present

## 2021-03-08 DIAGNOSIS — Z634 Disappearance and death of family member: Secondary | ICD-10-CM | POA: Diagnosis not present

## 2021-03-08 DIAGNOSIS — J449 Chronic obstructive pulmonary disease, unspecified: Secondary | ICD-10-CM | POA: Diagnosis not present

## 2021-03-13 ENCOUNTER — Inpatient Hospital Stay: Payer: Medicare Other | Admitting: Internal Medicine

## 2021-03-21 DIAGNOSIS — H401131 Primary open-angle glaucoma, bilateral, mild stage: Secondary | ICD-10-CM | POA: Diagnosis not present

## 2021-03-21 DIAGNOSIS — H353231 Exudative age-related macular degeneration, bilateral, with active choroidal neovascularization: Secondary | ICD-10-CM | POA: Diagnosis not present

## 2021-03-21 DIAGNOSIS — H35033 Hypertensive retinopathy, bilateral: Secondary | ICD-10-CM | POA: Diagnosis not present

## 2021-03-21 DIAGNOSIS — Z961 Presence of intraocular lens: Secondary | ICD-10-CM | POA: Diagnosis not present

## 2021-05-11 ENCOUNTER — Ambulatory Visit: Payer: Medicare Other | Admitting: Internal Medicine

## 2021-05-11 ENCOUNTER — Inpatient Hospital Stay: Payer: Medicare Other | Attending: Internal Medicine | Admitting: Internal Medicine

## 2021-05-11 ENCOUNTER — Other Ambulatory Visit: Payer: Self-pay

## 2021-05-11 VITALS — BP 130/68 | HR 92 | Temp 97.6°F | Resp 19 | Ht 60.5 in | Wt 167.8 lb

## 2021-05-11 DIAGNOSIS — Z85118 Personal history of other malignant neoplasm of bronchus and lung: Secondary | ICD-10-CM | POA: Diagnosis not present

## 2021-05-11 DIAGNOSIS — Z9221 Personal history of antineoplastic chemotherapy: Secondary | ICD-10-CM | POA: Insufficient documentation

## 2021-05-11 DIAGNOSIS — Z923 Personal history of irradiation: Secondary | ICD-10-CM | POA: Diagnosis not present

## 2021-05-11 DIAGNOSIS — C3491 Malignant neoplasm of unspecified part of right bronchus or lung: Secondary | ICD-10-CM | POA: Diagnosis not present

## 2021-05-11 NOTE — Progress Notes (Signed)
[facility] [phone]   [phone]  OFFICE PROGRESS NOTE  [name], MD [place] [address]  DIAGNOSIS: Clinically stage IIIA (T1a, N2, M0) non-small cell lung cancer, poorly differentiated squamous cell carcinoma diagnosed in November 2013   PRIOR THERAPY: status post course of concurrent chemoradiation with weekly carboplatin and paclitaxel when the patient was in [place].   CURRENT THERAPY: Observation.  INTERVAL HISTORY: Eric Hinton 81 y.o. male returns to the clinic today for follow-up visit accompanied by his son Eric Hinton.  The patient is feeling fine today with no concerning complaints.  Unfortunately he lost his wife in November 2022 secondary to fall and unable to recover from that.  He was followed closely over the last few years with virtual visit and imaging studies.  He denied having any current chest pain, shortness of breath, cough or hemoptysis.  He denied having any fever or chills.  He has no nausea, vomiting, diarrhea or constipation.  He has no headache but has some visual changes recently.  He is wondering about switching his [place] and [place] issues.  He had repeat CT scan of the chest performed recently and he is here for evaluation and discussion of his scan results.  MEDICAL HISTORY: Past Medical History:  Diagnosis Date   Arthritis    Carotid artery disease ([facility])    Status post bilateral CEA - Dr. Donnetta Hutching   Constipation    COPD (chronic obstructive pulmonary disease) (HCC)    DDD (degenerative disc disease), cervical    Degenerative disc disease, lumbar    Essential hypertension    Hyperlipidemia    Macular degeneration    Prostate cancer ([facility])    Skin cancer, basal cell    Squamous cell lung cancer ([facility]) 07/15/2015   Status post chemotherapy and XRT - Dr. Julien Nordmann   Tubular adenoma of colon     ALLERGIES:  is allergic to  bupropion.  MEDICATIONS:  Current Outpatient Medications  Medication Sig Dispense Refill   aspirin 325 MG tablet Take 1 tablet (325 mg total) by mouth daily.     atorvastatin (LIPITOR) 10 MG tablet Take 10 mg by mouth daily.     bevacizumab (AVASTIN) 400 MG/16ML SOLN 1.25 mg every 8 (eight) weeks. Left eye     Cyanocobalamin (VITAMIN B-12) 2500 MCG SUBL Place 2,500 mcg under the tongue daily.     FLUoxetine (PROZAC) 20 MG capsule Take 20 mg by mouth daily.     latanoprost (XALATAN) 0.005 % ophthalmic solution Place 1 drop into both eyes at bedtime.     menthol-zinc oxide (GOLD BOND) powder Apply 1 application topically daily as needed (for itching).     Multiple Vitamins-Minerals (PRESERVISION AREDS 2) CAPS Take 1 capsule by mouth 2 (two) times daily.      naproxen sodium (ANAPROX) 220 MG tablet Take 440 mg by mouth 2 (two) times daily as needed (for pain).     tamsulosin (FLOMAX) 0.4 MG CAPS capsule TAKE 1 CAPSULE BY MOUTH DAILY 30 capsule 0   telmisartan (MICARDIS) 80 MG tablet Take 80 mg by mouth daily.     No current facility-administered medications for this visit.    SURGICAL HISTORY:  Past Surgical History:  Procedure Laterality Date   CATARACT EXTRACTION W/ INTRAOCULAR LENS  IMPLANT, BILATERAL Bilateral    COLONOSCOPY     COLONOSCOPY WITH PROPOFOL N/A 05/17/2017  Procedure: COLONOSCOPY WITH PROPOFOL;  Surgeon: Rogene Houston, MD;  [facility];  Service: Endoscopy;  Laterality: N/A;  10:15   ENDARTERECTOMY Right 08/29/2015   Procedure: RIGHT CAROTID ENDARTERECTOMY WITH PATCH ANGIOPLASTY;  Surgeon: Rosetta Posner, MD;  Location: [facility];  Service: Vascular;  Laterality: Right;   ENDARTERECTOMY Left 10/07/2015   Procedure: LEFT CAROTID ARTERY ENDARTERECTOMY;  Surgeon: Rosetta Posner, MD;  Location: [facility];  Service: Vascular;  Laterality: Left;   NOSE SURGERY     "rebulit my nose; related to skin cancer"   PATCH ANGIOPLASTY Left 10/07/2015   Procedure: With [place];  Surgeon: Rosetta Posner, MD;  Location: [facility];  Service: Vascular;  Laterality: Left;   POLYPECTOMY  05/17/2017   Procedure: POLYPECTOMY;  Surgeon: Rogene Houston, MD;  [facility];  Service: Endoscopy;;  colon    PROSTATE BIOPSY     SKIN CANCER EXCISION Right    neck   TONSILLECTOMY      REVIEW OF SYSTEMS:  A comprehensive review of systems was negative except for: Constitutional: positive for fatigue   PHYSICAL EXAMINATION: General appearance: alert, cooperative, and no distress Head: Normocephalic, without obvious abnormality, atraumatic Neck: no adenopathy, no JVD, supple, symmetrical, trachea midline, and thyroid not enlarged, symmetric, no tenderness/mass/nodules Lymph nodes: Cervical, supraclavicular, and axillary nodes normal. Resp: clear to auscultation bilaterally Back: symmetric, no curvature. ROM normal. No CVA tenderness. Cardio: regular rate and rhythm, S1, S2 normal, no murmur, click, rub or gallop GI: soft, non-tender; bowel sounds normal; no masses,  no organomegaly Extremities: extremities normal, atraumatic, no cyanosis or edema  ECOG PERFORMANCE STATUS: 1 - Symptomatic but completely ambulatory  Blood pressure 130/68, pulse 92, temperature 97.6 F (36.4 C), temperature source Oral, resp. rate 19, height 5' 0.5" (1.537 m), weight 167 lb 12.8 oz (76.1 kg), SpO2 98 %.  LABORATORY DATA: Lab Results  Component Value Date   WBC 5.0 02/20/2021   HGB 15.4 02/20/2021   HCT 46.0 02/20/2021   MCV 96.8 02/20/2021   PLT 173 02/20/2021      Chemistry      Component Value Date/Time   NA 136 02/20/2021 1123   NA 139 08/03/2016 1029   K 4.0 02/20/2021 1123   K 4.8 08/03/2016 1029   CL 103 02/20/2021 1123   CO2 24 02/20/2021 1123   CO2 23 08/03/2016 1029   BUN 17 02/20/2021 1123   BUN 19.4 08/03/2016 1029   CREATININE 1.02 02/20/2021 1123   CREATININE 1.05 02/15/2020 1106   CREATININE 1.1 08/03/2016 1029      Component  Value Date/Time   CALCIUM 9.3 02/20/2021 1123   CALCIUM 9.8 08/03/2016 1029   ALKPHOS 63 02/20/2021 1123   ALKPHOS 67 08/03/2016 1029   AST 16 02/20/2021 1123   AST 17 02/15/2020 1106   AST 17 08/03/2016 1029   ALT 19 02/20/2021 1123   ALT 21 02/15/2020 1106   ALT 24 08/03/2016 1029   BILITOT 0.4 02/20/2021 1123   BILITOT 0.5 02/15/2020 1106   BILITOT 0.65 08/03/2016 1029       RADIOGRAPHIC STUDIES: No results found.   ASSESSMENT AND PLAN:  This is a very pleasant 81 years old white male with history of stage IIIa non-small cell lung cancer diagnosed in November 2013 status post course of concurrent chemoradiation in [place].  The patient has been in observation since early 2014 with no concerning complaints. The patient is doing fine today with  no concerning complaints. His last CT scan of the chest in November 2022 showed no concerning findings for disease progression. I recommended for the patient to continue on observation with repeat CT scan of the chest in 1 year. Based on his request and preference, I will refer him to see Dr. Delton Coombes at the [facility] closer to home because of the logistic and transportation issues. The patient was advised to call if he has any concerning symptoms in the interval. The patient voices understanding of current disease status and treatment options and is in agreement with the current care plan. All questions were answered. The patient knows to call the clinic with any problems, questions or concerns. We can certainly see the patient much sooner if necessary.  Disclaimer: This note was dictated with voice recognition software. Similar sounding words can inadvertently be transcribed and may not be corrected upon review.

## 2021-06-07 ENCOUNTER — Other Ambulatory Visit: Payer: Self-pay

## 2021-06-07 ENCOUNTER — Encounter (HOSPITAL_COMMUNITY): Payer: Self-pay | Admitting: Hematology

## 2021-06-07 ENCOUNTER — Inpatient Hospital Stay (HOSPITAL_COMMUNITY): Payer: Medicare Other | Attending: Hematology | Admitting: Hematology

## 2021-06-07 VITALS — BP 126/68 | HR 93 | Temp 97.5°F | Resp 20 | Ht 63.0 in | Wt 170.1 lb

## 2021-06-07 DIAGNOSIS — Z8546 Personal history of malignant neoplasm of prostate: Secondary | ICD-10-CM | POA: Diagnosis not present

## 2021-06-07 DIAGNOSIS — C3490 Malignant neoplasm of unspecified part of unspecified bronchus or lung: Secondary | ICD-10-CM | POA: Insufficient documentation

## 2021-06-07 DIAGNOSIS — Z85038 Personal history of other malignant neoplasm of large intestine: Secondary | ICD-10-CM | POA: Insufficient documentation

## 2021-06-07 DIAGNOSIS — C3491 Malignant neoplasm of unspecified part of right bronchus or lung: Secondary | ICD-10-CM

## 2021-06-07 NOTE — Patient Instructions (Addendum)
[facility] at [facility] ?Discharge Instructions ? ?You were seen and examined today by Dr. Delton Coombes. Dr. Delton Coombes is a medical oncologist, meaning that he specializes in the treatment of cancer diagnoses. Dr. Delton Coombes discussed your past medical history, family history of cancers, and the events that led to you being here today. ? ?You are here to establish care for your previous diagnosis of lung cancer. Dr. Delton Coombes reviewed your most recent CT scan, which continues to be stable.  ? ?Dr. Delton Coombes has recommended continuing yearly follow-up. You will have a CT scan of your chest and labs prior to your next visit with Dr. Delton Coombes. ? ?Follow-up as scheduled. ? ? ?Thank you for choosing [facility] at [facility] to provide your oncology and hematology care.  To afford each patient quality time with our provider, please arrive at least 15 minutes before your scheduled appointment time.  ? ?If you have a lab appointment with the [facility] please come in thru the Main Entrance and check in at the main information desk. ? ?You need to re-schedule your appointment should you arrive 10 or more minutes late.  We strive to give you quality time with our providers, and arriving late affects you and other patients whose appointments are after yours.  Also, if you no show three or more times for appointments you may be dismissed from the clinic at the providers discretion.     ?Again, thank you for choosing [facility].  Our hope is that these requests will decrease the amount of time that you wait before being seen by our physicians.       ?_____________________________________________________________ ? ?Should you have questions after your visit to [facility], please contact our office at [phone] and follow the prompts.  Our office hours are 8:00 a.m. and 4:30 p.m. Monday - Friday.  Please note that voicemails left after 4:00  p.m. may not be returned until the following business day.  We are closed weekends and major holidays.  You do have access to a nurse 24-7, just call the main number to the clinic [phone] and do not press any options, hold on the line and a nurse will answer the phone.   ? ?For prescription refill requests, have your pharmacy contact our office and allow 72 hours.   ? ?Due to Covid, you will need to wear a mask upon entering the hospital. If you do not have a mask, a mask will be given to you at the Main Entrance upon arrival. For doctor visits, patients may have 1 support person age 19 or older with them. For treatment visits, patients can not have anyone with them due to social distancing guidelines and our immunocompromised population.  ? ? ? ?

## 2021-06-07 NOTE — Progress Notes (Signed)
? [name] ?[address] ?[place], [place] [address] ? ? ?CLINIC:  ?Medical Oncology/Hematology ? ?CONSULT NOTE ? ?Patient Care Team: ?[name], MD as PCP - General (Internal Medicine) ?Derek Jack, MD as Medical Oncologist (Medical Oncology) ? ?CHIEF COMPLAINTS/PURPOSE OF CONSULTATION:  ?Evaluation of squamous cell carcinoma of right lung to establish care  ? ?HISTORY OF PRESENTING ILLNESS:  ?[name] 81 y.o. male is here because of squamous cell carcinoma of right lung to establish care, at the request of Dr. Grayland Ormond. ? ?Today he reports feeling well, and he is accompanied by his son. He has a history of prostate cancer diagnosed in 2013 treated in [place] with 5 radiation treatment and prostate seed implanted; he did not undergo surgery at that time. He has a history of lung cancer which was treated with chemotherapy and radiation in [place]. He denies recent weight loss. He has an occasional cough, and he denies hemoptysis. He denies any recent changes to his health. He has neuropathy in his feet which he reports is tolerable.  ? ?He moved to [place] from [place] in 2017. He is here today as he no longer wants to make the trip into [place] to see an oncologist. He lives at home on his own. He is unable to drive due to macular degeneration which started in 2017. His granddaughter lives nearby, and her and his son assist him with transport and grocery shopping.  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 He currently smokes 2-3 ppd and has smoked for 75 years. His mother had colon cancer, and 4 paternal uncles as well as his father had prostate cancer.  ? ?MEDICAL HISTORY:  ?Past Medical History:  ?Diagnosis Date  ? Arthritis   ? Carotid artery disease ([facility])   ? Status post bilateral CEA - Dr. Donnetta Hutching  ? Constipation   ? COPD (chronic obstructive pulmonary disease) ([facility])   ? DDD (degenerative  disc disease), cervical   ? Degenerative disc disease, lumbar   ? Essential hypertension   ? Hyperlipidemia   ? Macular degeneration   ? Prostate cancer ([facility])   ? Skin cancer, basal cell   ? Squamous cell lung cancer ([facility]) 07/15/2015  ? Status post chemotherapy and XRT - Dr. Julien Nordmann  ? Tubular adenoma of colon   ? ? ?SURGICAL HISTORY: ?Past Surgical History:  ?Procedure Laterality Date  ? CATARACT EXTRACTION W/ INTRAOCULAR LENS  IMPLANT, BILATERAL Bilateral   ? COLONOSCOPY    ? COLONOSCOPY WITH PROPOFOL N/A 05/17/2017  ? Procedure: COLONOSCOPY WITH PROPOFOL;  Surgeon: Rogene Houston, MD;  [facility];  Service: Endoscopy;  Laterality: N/A;  10:15  ? ENDARTERECTOMY Right 08/29/2015  ? Procedure: RIGHT CAROTID ENDARTERECTOMY WITH PATCH ANGIOPLASTY;  Surgeon: Rosetta Posner, MD;  Location: [facility];  Service: Vascular;  Laterality: Right;  ? ENDARTERECTOMY Left 10/07/2015  ? Procedure: LEFT CAROTID ARTERY ENDARTERECTOMY;  Surgeon: Rosetta Posner, MD;  Location: [facility];  Service: Vascular;  Laterality: Left;  ? NOSE SURGERY    ? "rebulit my nose; related to skin cancer"  ? PATCH ANGIOPLASTY Left 10/07/2015  ? Procedure: With [place];  Surgeon: Rosetta Posner, MD;  Location: [facility];  Service: Vascular;  Laterality: Left;  ? POLYPECTOMY  05/17/2017  ? Procedure: POLYPECTOMY;  Surgeon: Rogene Houston, MD;  [facility];  Service: Endoscopy;;  colon ?  ? PROSTATE BIOPSY    ? SKIN CANCER EXCISION Right   ? neck  ? TONSILLECTOMY    ? ? ?SOCIAL HISTORY: ?Social History  ? ?Socioeconomic History  ? Marital status: Married  ?  Spouse name: Not on file  ? Number of children: Not on file  ? Years of education: Not on file  ? Highest education level: Not on file  ?Occupational History  ? Not on file  ?Tobacco Use  ? Smoking status: Every Day  ?  Packs/day: 1.50  ?  Years: 69.00  ?  Pack years: 103.50  ?  Types: Cigarettes  ?  Start date: 04/03/1946  ? Smokeless tobacco: Never  ?Vaping Use  ?  Vaping Use: Never used  ?Substance and Sexual Activity  ? Alcohol use: Yes  ?  Alcohol/week: 35.0 standard drinks  ?  Types: 35 Cans of beer per week  ?  Comment: 10/07/2015 "4-6 beers/day"  ? Drug use: No  ? Sexual activity: Not on file  ?Other Topics Concern  ? Not on file  ?Social History Narrative  ? Not on file  ? ?Social Determinants of Health  ? ?Financial Resource Strain: Not on file  ?Food Insecurity: Not on file  ?Transportation Needs: Not on file  ?Physical Activity: Not on file  ?Stress: Not on file  ?Social Connections: Not on file  ?Intimate Partner Violence: Not on file  ? ? ?FAMILY HISTORY: ?Family History  ?Problem Relation Age of Onset  ? Cancer Mother   ? Stroke Father   ? Heart disease Father   ? Diabetes Mellitus II Sister   ? ? ?ALLERGIES:  ?is allergic to bupropion. ? ?MEDICATIONS:  ?Current Outpatient Medications  ?Medication Sig Dispense Refill  ? aspirin 325 MG tablet Take 1 tablet (325 mg total) by mouth daily.    ? atorvastatin (LIPITOR) 10 MG tablet Take 10 mg by mouth daily.    ? bevacizumab (AVASTIN) 400 MG/16ML SOLN 1.25 mg every 8 (eight) weeks. Left eye    ? Cyanocobalamin (VITAMIN B-12) 2500 MCG SUBL Place 2,500 mcg under the tongue daily.    ? FLUoxetine (PROZAC) 20 MG capsule Take 20 mg by mouth daily.    ? latanoprost (XALATAN) 0.005 % ophthalmic solution Place 1 drop into both eyes at bedtime.    ? menthol-zinc oxide (GOLD BOND) powder Apply 1 application topically daily as needed (for itching).    ? Multiple Vitamins-Minerals (PRESERVISION AREDS 2) CAPS Take 1 capsule by mouth 2 (two) times daily.     ? naproxen sodium (ANAPROX) 220 MG tablet Take 440 mg by mouth 2 (two) times daily as needed (for pain).    ? tamsulosin (FLOMAX) 0.4 MG CAPS capsule TAKE 1 CAPSULE BY MOUTH DAILY 30 capsule 0  ? telmisartan (MICARDIS) 80 MG tablet Take 80 mg by mouth daily.    ? ?No current facility-administered medications for this visit.  ? ? ?REVIEW OF SYSTEMS:   ?Review of Systems   ?Constitutional:  Positive for fatigue. Negative for appetite change and unexpected weight change.  ?HENT:   Positive for trouble swallowing.   ?Respiratory:  Positive for cough (occasional). Negative for hemoptysis.   ?Neurological:  Positive for numbness (feet).  ?Hematological:  Bruises/bleeds easily.  ?Psychiatric/Behavioral:  Positive for sleep disturbance.   ?All other systems reviewed and are negative. ? ? ?PHYSICAL EXAMINATION: ?ECOG PERFORMANCE STATUS: 1 - Symptomatic but completely ambulatory ? ?Vitals:  ? 06/07/21 1306  ?BP: 126/68  ?Pulse: 93  ?  Resp: 20  ?Temp: (!) 97.5 ?F (36.4 ?C)  ?SpO2: 96%  ? ?Filed Weights  ? 06/07/21 1306  ?Weight: 170 lb 1 oz (77.1 kg)  ? ?Physical Exam ?Vitals reviewed.  ?Constitutional:   ?   Appearance: Normal appearance.  ?Cardiovascular:  ?   Rate and Rhythm: Normal rate and regular rhythm.  ?   Pulses: Normal pulses.  ?   Heart sounds: Normal heart sounds.  ?Pulmonary:  ?   Effort: Pulmonary effort is normal.  ?   Breath sounds: Normal breath sounds.  ?Abdominal:  ?   Palpations: Abdomen is soft. There is no hepatomegaly, splenomegaly or mass.  ?   Tenderness: There is no abdominal tenderness.  ?Musculoskeletal:  ?   Right lower leg: Edema (trace) present.  ?   Left lower leg: No edema.  ?Lymphadenopathy:  ?   Cervical: No cervical adenopathy.  ?   Right cervical: No superficial cervical adenopathy. ?   Left cervical: No superficial cervical adenopathy.  ?   Upper Body:  ?   Right upper body: No supraclavicular or axillary adenopathy.  ?   Left upper body: No supraclavicular or axillary adenopathy.  ?   Lower Body: No right inguinal adenopathy. No left inguinal adenopathy.  ?Neurological:  ?   General: No focal deficit present.  ?   Mental Status: He is alert and oriented to person, place, and time.  ?Psychiatric:     ?   Mood and Affect: Mood normal.     ?   Behavior: Behavior normal.  ? ? ? ?LABORATORY DATA:  ?I have reviewed the data as listed ?CBC Latest Ref Rng &  Units 02/20/2021 02/15/2020 02/12/2019  ?WBC 4.0 - 10.5 K/uL 5.0 6.1 6.1  ?Hemoglobin 13.0 - 17.0 g/dL 15.4 14.7 14.5  ?Hematocrit 39.0 - 52.0 % 46.0 43.6 44.7  ?Platelets 150 - 400 K/uL 173 197 197  ? ?CMP Latest Ref

## 2021-06-13 DIAGNOSIS — Z961 Presence of intraocular lens: Secondary | ICD-10-CM | POA: Diagnosis not present

## 2021-06-13 DIAGNOSIS — H35033 Hypertensive retinopathy, bilateral: Secondary | ICD-10-CM | POA: Diagnosis not present

## 2021-06-13 DIAGNOSIS — H353231 Exudative age-related macular degeneration, bilateral, with active choroidal neovascularization: Secondary | ICD-10-CM | POA: Diagnosis not present

## 2021-06-13 DIAGNOSIS — H44111 Panuveitis, right eye: Secondary | ICD-10-CM | POA: Diagnosis not present

## 2021-06-13 DIAGNOSIS — H4311 Vitreous hemorrhage, right eye: Secondary | ICD-10-CM | POA: Diagnosis not present

## 2021-06-13 DIAGNOSIS — H401131 Primary open-angle glaucoma, bilateral, mild stage: Secondary | ICD-10-CM | POA: Diagnosis not present

## 2021-07-14 ENCOUNTER — Other Ambulatory Visit: Payer: Self-pay

## 2021-07-14 DIAGNOSIS — I6523 Occlusion and stenosis of bilateral carotid arteries: Secondary | ICD-10-CM

## 2021-07-25 ENCOUNTER — Ambulatory Visit (HOSPITAL_COMMUNITY)
Admission: RE | Admit: 2021-07-25 | Discharge: 2021-07-25 | Disposition: A | Discharge status: Home / Self Care | Payer: Medicare Other | Source: Ambulatory Visit | Attending: Vascular Surgery | Admitting: Vascular Surgery

## 2021-07-25 ENCOUNTER — Encounter: Payer: Self-pay | Admitting: Vascular Surgery

## 2021-07-25 ENCOUNTER — Ambulatory Visit (INDEPENDENT_AMBULATORY_CARE_PROVIDER_SITE_OTHER): Payer: Medicare Other | Admitting: Vascular Surgery

## 2021-07-25 VITALS — BP 113/64 | HR 87 | Temp 98.6°F | Resp 20 | Ht 63.0 in | Wt 165.0 lb

## 2021-07-25 DIAGNOSIS — I6523 Occlusion and stenosis of bilateral carotid arteries: Secondary | ICD-10-CM | POA: Insufficient documentation

## 2021-07-25 NOTE — Progress Notes (Signed)
[facility] [place] ? ?ASSESSMENT / PLAN: ?81 y.o. male status post bilateral carotid endarterectomy.  Duplex shows widely patent endarterectomies with mild reaccumulation of atherosclerosis.  Continue best medical therapy for atherosclerosis.  He lives in [place] and would prefer to follow-up with Dr. Donnetta Hutching there, which we will arrange in 1 year. ? ?CHIEF COMPLAINT: Follow-up of carotid endarterectomies ? ?HISTORY OF PRESENT ILLNESS: ?Eric Hinton is a 81 y.o. male status post right carotid endarterectomy on 08/29/2015 and left carotid endarterectomy on 10/07/2015 both by Dr. Donnetta Hutching.  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  Since last office visit 1 year ago he denies any diagnosis of CVA or TIA.  He also denies any strokelike symptoms including slurring speech, further vision changes, or one-sided weakness.  He is taking an aspirin and a statin daily.  He is still an every day tobacco smoker.  He sees his PCP annually for management of chronic medical conditions including hypertension and hyperlipidemia.  He continues to do well overall. ? ?Past Medical History:  ?Diagnosis Date  ? Arthritis   ? Carotid artery disease ([place])   ? Status post bilateral CEA - Dr. Donnetta Hutching  ? Constipation   ? COPD (chronic obstructive pulmonary disease) ([place])   ? DDD (degenerative disc disease), cervical   ? Degenerative disc disease, lumbar   ? Essential hypertension   ? Hyperlipidemia   ? Macular degeneration   ? Prostate cancer ([place])   ? Skin cancer, basal cell   ? Squamous cell lung cancer ([place]) 07/15/2015  ? Status post chemotherapy and XRT - Dr. Julien Nordmann  ? Tubular adenoma of colon   ? ? ?Past Surgical History:  ?Procedure Laterality Date  ? CATARACT EXTRACTION W/ INTRAOCULAR LENS  IMPLANT, BILATERAL Bilateral   ? COLONOSCOPY    ? COLONOSCOPY WITH PROPOFOL N/A 05/17/2017  ? Procedure: COLONOSCOPY WITH PROPOFOL;  Surgeon: Rogene Houston, MD;  [facility];   Service: Endoscopy;  Laterality: N/A;  10:15  ? ENDARTERECTOMY Right 08/29/2015  ? Procedure: RIGHT CAROTID ENDARTERECTOMY WITH PATCH ANGIOPLASTY;  Surgeon: Rosetta Posner, MD;  Location: [facility];  Service: Vascular;  Laterality: Right;  ? ENDARTERECTOMY Left 10/07/2015  ? Procedure: LEFT CAROTID ARTERY ENDARTERECTOMY;  Surgeon: Rosetta Posner, MD;  Location: [facility];  Service: Vascular;  Laterality: Left;  ? NOSE SURGERY    ? "rebulit my nose; related to skin cancer"  ? PATCH ANGIOPLASTY Left 10/07/2015  ? Procedure: With [place];  Surgeon: Rosetta Posner, MD;  Location: [facility];  Service: Vascular;  Laterality: Left;  ? POLYPECTOMY  05/17/2017  ? Procedure: POLYPECTOMY;  Surgeon: Rogene Houston, MD;  [facility];  Service: Endoscopy;;  colon ?  ? PROSTATE BIOPSY    ? SKIN CANCER EXCISION Right   ? neck  ? TONSILLECTOMY    ? ? ?Family History  ?Problem Relation Age of Onset  ? Cancer Mother   ? Stroke Father   ? Heart disease Father   ? Diabetes Mellitus II Sister   ? ? ?Social History  ? ?Socioeconomic History  ? Marital status: Married  ?  Spouse name: Not on file  ? Number of children: Not on file  ? Years of education: Not on file  ? Highest education level: Not on file  ?Occupational History  ? Not on file  ?Tobacco Use  ? Smoking status: Every Day  ?  Packs/day: 2.00  ?  Years: 69.00  ?  Pack years: 138.00  ?  Types: Cigarettes  ?  Start date: 04/03/1946  ? Smokeless tobacco: Never  ?Vaping Use  ? Vaping Use: Never used  ?Substance and Sexual Activity  ? Alcohol use: Yes  ?  Alcohol/week: 35.0 standard drinks  ?  Types: 35 Cans of beer per week  ?  Comment: 10/07/2015 "4-6 beers/day"  ? Drug use: No  ? Sexual activity: Not on file  ?Other Topics Concern  ? Not on file  ?Social History Narrative  ? Not on file  ? ?Social Determinants of Health  ? ?Financial Resource Strain: Not on file  ?Food Insecurity: Not on file  ?Transportation Needs: Not on file  ?Physical Activity: Not on file   ?Stress: Not on file  ?Social Connections: Not on file  ?Intimate Partner Violence: Not on file  ? ? ?Allergies  ?Allergen Reactions  ? Bupropion Hives  ? ? ?Current Outpatient Medications  ?Medication Sig Dispense Refill  ? aspirin 325 MG tablet Take 1 tablet (325 mg total) by mouth daily.    ? atorvastatin (LIPITOR) 10 MG tablet Take 10 mg by mouth daily.    ? bevacizumab (AVASTIN) 400 MG/16ML SOLN 1.25 mg every 8 (eight) weeks. Left eye    ? Cyanocobalamin (VITAMIN B-12) 2500 MCG SUBL Place 2,500 mcg under the tongue daily.    ? FLUoxetine (PROZAC) 20 MG capsule Take 20 mg by mouth daily.    ? latanoprost (XALATAN) 0.005 % ophthalmic solution Place 1 drop into both eyes at bedtime.    ? menthol-zinc oxide (GOLD BOND) powder Apply 1 application topically daily as needed (for itching).    ? Multiple Vitamins-Minerals (PRESERVISION AREDS 2) CAPS Take 1 capsule by mouth 2 (two) times daily.     ? naproxen sodium (ANAPROX) 220 MG tablet Take 440 mg by mouth 2 (two) times daily as needed (for pain).    ? tamsulosin (FLOMAX) 0.4 MG CAPS capsule TAKE 1 CAPSULE BY MOUTH DAILY 30 capsule 0  ? telmisartan (MICARDIS) 80 MG tablet Take 80 mg by mouth daily.    ? ?No current facility-administered medications for this visit.  ? ? ?PHYSICAL EXAM ?Vitals:  ? 07/25/21 1318  ?BP: 113/64  ?Pulse: 87  ?Resp: 20  ?Temp: 98.6 ?F (37 ?C)  ?SpO2: 98%  ?Weight: 165 lb (74.8 kg)  ?Height: 5\' 3"  (1.6 m)  ? ?Well-appearing elderly man in no acute distress ?Well-healed carotid endarterectomy incisions ? ?PERTINENT LABORATORY AND RADIOLOGIC DATA ? ?Most recent CBC ? ?  Latest Ref Rng & Units 02/20/2021  ? 10:58 AM 02/15/2020  ? 11:06 AM 02/12/2019  ?  8:54 AM  ?CBC  ?WBC 4.0 - 10.5 K/uL 5.0   6.1   6.1    ?Hemoglobin 13.0 - 17.0 g/dL 15.4   14.7   14.5    ?Hematocrit 39.0 - 52.0 % 46.0   43.6   44.7    ?Platelets 150 - 400 K/uL 173   197   197    ?  ? ?Most recent CMP ? ?  Latest Ref Rng & Units 02/20/2021  ? 11:23 AM 02/15/2020  ? 11:06 AM  02/12/2019  ?  8:54 AM  ?CMP  ?Glucose 70 - 99 mg/dL 105   98   109    ?BUN 8 - 23 mg/dL 17   11   22     ?Creatinine 0.61 - 1.24 mg/dL 1.02   1.05   1.07    ?  Sodium 135 - 145 mmol/L 136   141   140    ?Potassium 3.5 - 5.1 mmol/L 4.0   4.5   4.7    ?Chloride 98 - 111 mmol/L 103   107   105    ?CO2 22 - 32 mmol/L 24   26   25     ?Calcium 8.9 - 10.3 mg/dL 9.3   9.9   9.6    ?Total Protein 6.5 - 8.1 g/dL 7.2   7.3   7.3    ?Total Bilirubin 0.3 - 1.2 mg/dL 0.4   0.5   0.7    ?Alkaline Phos 38 - 126 U/L 63   79   69    ?AST 15 - 41 U/L 16   17   18     ?ALT 0 - 44 U/L 19   21   23     ? ?Carotid duplex today shows ?Right Carotid: Velocities in the right ICA are consistent with a 1-39%  ?stenosis.  ? ?Left Carotid: Velocities in the left ICA are consistent with a 1-39%  ?stenosis.  ? ?Vertebrals:  Bilateral vertebral arteries demonstrate antegrade flow.  ?Subclavians: Normal flow hemodynamics were seen in bilateral subclavian  ?             arteries.  ? ?Yevonne Aline. Stanford Breed, MD ?[facility] [place] [facility] ?Office Phone Number: [phone] ?07/25/2021 5:17 PM ? ?Total time spent on preparing this encounter including chart review, data review, collecting history, examining the patient, coordinating care for this established patient, 40 minutes. ? ?Portions of this report may have been transcribed using voice recognition software.  Every effort has been made to ensure accuracy; however, inadvertent computerized transcription errors may still be present. ? ? ? ?

## 2021-09-18 ENCOUNTER — Telehealth: Payer: Self-pay

## 2021-09-18 ENCOUNTER — Other Ambulatory Visit: Payer: Self-pay

## 2021-09-18 DIAGNOSIS — R200 Anesthesia of skin: Secondary | ICD-10-CM

## 2021-09-18 NOTE — Telephone Encounter (Signed)
Pt called stating that he was experiencing numbness in his legs.  Reviewed pt's chart, returned pt's call for clarification, two identifiers used. Pt stated that he was having constant numbness in both legs, more in his R leg for the past 4-6 weeks. Pt denies any burning, aching, tingling, swelling, or redness. Informed him that I would work on getting an appt and call him back.  Called pt to inform him of scheduled appts. Pt stated that he relied on someone for transportation and would call my direct # tomorrow, Tuesday, to confirm if she was able to drive him that day. Voice understanding.

## 2021-09-19 DIAGNOSIS — Z961 Presence of intraocular lens: Secondary | ICD-10-CM | POA: Diagnosis not present

## 2021-09-19 DIAGNOSIS — H401131 Primary open-angle glaucoma, bilateral, mild stage: Secondary | ICD-10-CM | POA: Diagnosis not present

## 2021-09-19 DIAGNOSIS — H353231 Exudative age-related macular degeneration, bilateral, with active choroidal neovascularization: Secondary | ICD-10-CM | POA: Diagnosis not present

## 2021-09-19 DIAGNOSIS — H35033 Hypertensive retinopathy, bilateral: Secondary | ICD-10-CM | POA: Diagnosis not present

## 2021-09-20 ENCOUNTER — Ambulatory Visit (INDEPENDENT_AMBULATORY_CARE_PROVIDER_SITE_OTHER): Payer: Medicare Other | Admitting: Vascular Surgery

## 2021-09-20 ENCOUNTER — Ambulatory Visit: Payer: Medicare Other | Admitting: Vascular Surgery

## 2021-09-20 ENCOUNTER — Encounter: Payer: Self-pay | Admitting: Vascular Surgery

## 2021-09-20 ENCOUNTER — Ambulatory Visit (INDEPENDENT_AMBULATORY_CARE_PROVIDER_SITE_OTHER): Payer: Medicare Other

## 2021-09-20 VITALS — BP 132/76 | HR 69 | Temp 98.2°F | Resp 14 | Ht 64.0 in | Wt 167.4 lb

## 2021-09-20 DIAGNOSIS — R200 Anesthesia of skin: Secondary | ICD-10-CM | POA: Diagnosis not present

## 2021-09-20 DIAGNOSIS — I6523 Occlusion and stenosis of bilateral carotid arteries: Secondary | ICD-10-CM

## 2021-09-20 NOTE — Progress Notes (Signed)
[facility] [place]  Patient name: Eric Hinton MRN: [id number] DOB: Sep 30, 1940 Sex: male  REASON FOR VISIT: Evaluation lower extremity symptoms to rule out arterial insufficiency  HPI: Eric Hinton is a 81 y.o. male well-known to me from prior staged bilateral endarterectomies in 2017.  He recently saw Dr. Stanford Breed in our [facility] office with repeat carotid duplex showing widely patent endarterectomy sites bilaterally.  He called our office with concerns of tingling sensation in both lower extremities right greater than left with concern of arterial insufficiency.  He is seeing me today with noninvasive studies.  He does not have any history of claudication or tissue loss.  He reports this has been present for several weeks he noticed tingling sensation on the lateral aspect of his calf.  Past Medical History:  Diagnosis Date   Arthritis    Carotid artery disease ([place])    Status post bilateral CEA - Dr. Donnetta Hutching   Constipation    COPD (chronic obstructive pulmonary disease) ([facility])    DDD (degenerative disc disease), cervical    Degenerative disc disease, lumbar    Essential hypertension    Hyperlipidemia    Macular degeneration    Prostate cancer ([place])    Skin cancer, basal cell    Squamous cell lung cancer ([place]) 07/15/2015   Status post chemotherapy and XRT - Dr. Julien Nordmann   Tubular adenoma of colon     Family History  Problem Relation Age of Onset   Cancer Mother    Stroke Father    Heart disease Father    Diabetes Mellitus II Sister     SOCIAL HISTORY: Social History   Tobacco Use   Smoking status: Every Day    Packs/day: 2.00    Years: 69.00    Total pack years: 138.00    Types: Cigarettes    Start date: 04/03/1946   Smokeless tobacco: Never  Substance Use Topics   Alcohol use: Yes    Alcohol/week: 35.0 standard drinks of alcohol    Types: 35 Cans of beer per week    Comment: 10/07/2015 "4-6  beers/day"    Allergies  Allergen Reactions   Bupropion Hives    Current Outpatient Medications  Medication Sig Dispense Refill   aspirin 325 MG tablet Take 1 tablet (325 mg total) by mouth daily.     atorvastatin (LIPITOR) 10 MG tablet Take 10 mg by mouth daily.     bevacizumab (AVASTIN) 400 MG/16ML SOLN 1.25 mg every 8 (eight) weeks. Left eye     FLUoxetine (PROZAC) 20 MG capsule Take 20 mg by mouth daily.     latanoprost (XALATAN) 0.005 % ophthalmic solution Place 1 drop into both eyes at bedtime.     menthol-zinc oxide (GOLD BOND) powder Apply 1 application topically daily as needed (for itching).     Multiple Vitamins-Minerals (PRESERVISION AREDS 2) CAPS Take 1 capsule by mouth 2 (two) times daily.     tamsulosin (FLOMAX) 0.4 MG CAPS capsule TAKE 1 CAPSULE BY MOUTH DAILY 30 capsule 0   telmisartan (MICARDIS) 80 MG tablet Take 80 mg by mouth daily.     Cyanocobalamin (VITAMIN B-12) 2500 MCG SUBL Place 2,500 mcg under the tongue daily. (Patient not taking: Reported on 09/20/2021)     naproxen sodium (ANAPROX) 220 MG tablet Take 440 mg by mouth 2 (two) times daily as needed (for pain). (Patient not taking: Reported on 09/20/2021)     No current facility-administered medications for this visit.  REVIEW OF SYSTEMS:  [X]  denotes positive finding, [ ]  denotes negative finding Cardiac  Comments:  Chest pain or chest pressure:    Shortness of breath upon exertion:    Short of breath when lying flat:    Irregular heart rhythm:        Vascular    Pain in calf, thigh, or hip brought on by ambulation:    Pain in feet at night that wakes you up from your sleep:     Blood clot in your veins:    Leg swelling:           PHYSICAL EXAM: Vitals:   09/20/21 1452  BP: 132/76  Pulse: 69  Resp: 14  Temp: 98.2 F (36.8 C)  TempSrc: Temporal  SpO2: 97%  Weight: 167 lb 6.4 oz (75.9 kg)  Height: 5\' 4"  (1.626 m)    GENERAL: The patient is a well-nourished male, in no acute distress.  The vital signs are documented above. CARDIOVASCULAR: Carotid arteries without bruits bilaterally.  2+ radial pulses bilaterally.  2+ posterior tibial pulses bilaterally. PULMONARY: There is good air exchange  MUSCULOSKELETAL: There are no major deformities or cyanosis. NEUROLOGIC: No focal weakness or paresthesias are detected. SKIN: There are no ulcers or rashes noted. PSYCHIATRIC: The patient has a normal affect.  DATA:  Noninvasive studies reveal ankle arm index of 1.0 on the right and 0.9 on the left with triphasic waveforms bilaterally  MEDICAL ISSUES: Discussed these findings with the patient.  Explained that he has no evidence of lower extremity arterial sufficiency.  Suspect that this is related to very mild neuropathy.  He is having minimal symptoms associated with this and I suggest that he continue to monitor this and not initiate medical treatment unless this progresses.  He is comfortable with this suggestion.  He will see me again in 1 year with a bilateral carotid duplex follow-up    Rosetta Posner, MD FACS [facility] [phone]  Note: Portions of this report may have been transcribed using voice recognition software.  Every effort has been made to ensure accuracy; however, inadvertent computerized transcription errors may still be present.

## 2021-10-03 DIAGNOSIS — R7301 Impaired fasting glucose: Secondary | ICD-10-CM | POA: Diagnosis not present

## 2021-10-03 DIAGNOSIS — E782 Mixed hyperlipidemia: Secondary | ICD-10-CM | POA: Diagnosis not present

## 2021-10-04 DIAGNOSIS — Z8546 Personal history of malignant neoplasm of prostate: Secondary | ICD-10-CM | POA: Diagnosis not present

## 2021-10-04 DIAGNOSIS — E782 Mixed hyperlipidemia: Secondary | ICD-10-CM | POA: Diagnosis not present

## 2021-10-04 DIAGNOSIS — I998 Other disorder of circulatory system: Secondary | ICD-10-CM | POA: Diagnosis not present

## 2021-10-04 DIAGNOSIS — H3530 Unspecified macular degeneration: Secondary | ICD-10-CM | POA: Diagnosis not present

## 2021-10-04 DIAGNOSIS — K029 Dental caries, unspecified: Secondary | ICD-10-CM | POA: Diagnosis not present

## 2021-10-04 DIAGNOSIS — R7301 Impaired fasting glucose: Secondary | ICD-10-CM | POA: Diagnosis not present

## 2021-10-04 DIAGNOSIS — Z720 Tobacco use: Secondary | ICD-10-CM | POA: Diagnosis not present

## 2021-10-04 DIAGNOSIS — I2510 Atherosclerotic heart disease of native coronary artery without angina pectoris: Secondary | ICD-10-CM | POA: Diagnosis not present

## 2021-10-04 DIAGNOSIS — J449 Chronic obstructive pulmonary disease, unspecified: Secondary | ICD-10-CM | POA: Diagnosis not present

## 2021-10-04 DIAGNOSIS — Z85118 Personal history of other malignant neoplasm of bronchus and lung: Secondary | ICD-10-CM | POA: Diagnosis not present

## 2021-10-04 DIAGNOSIS — Z634 Disappearance and death of family member: Secondary | ICD-10-CM | POA: Diagnosis not present

## 2021-10-04 DIAGNOSIS — I10 Essential (primary) hypertension: Secondary | ICD-10-CM | POA: Diagnosis not present

## 2021-12-26 DIAGNOSIS — H44111 Panuveitis, right eye: Secondary | ICD-10-CM | POA: Diagnosis not present

## 2021-12-26 DIAGNOSIS — Z961 Presence of intraocular lens: Secondary | ICD-10-CM | POA: Diagnosis not present

## 2021-12-26 DIAGNOSIS — H353231 Exudative age-related macular degeneration, bilateral, with active choroidal neovascularization: Secondary | ICD-10-CM | POA: Diagnosis not present

## 2021-12-26 DIAGNOSIS — H35033 Hypertensive retinopathy, bilateral: Secondary | ICD-10-CM | POA: Diagnosis not present

## 2021-12-26 DIAGNOSIS — H401131 Primary open-angle glaucoma, bilateral, mild stage: Secondary | ICD-10-CM | POA: Diagnosis not present

## 2021-12-26 DIAGNOSIS — H4311 Vitreous hemorrhage, right eye: Secondary | ICD-10-CM | POA: Diagnosis not present

## 2022-01-23 DIAGNOSIS — D225 Melanocytic nevi of trunk: Secondary | ICD-10-CM | POA: Diagnosis not present

## 2022-01-23 DIAGNOSIS — L820 Inflamed seborrheic keratosis: Secondary | ICD-10-CM | POA: Diagnosis not present

## 2022-02-20 ENCOUNTER — Ambulatory Visit (HOSPITAL_COMMUNITY)
Admission: RE | Admit: 2022-02-20 | Discharge: 2022-02-20 | Disposition: A | Discharge status: Home / Self Care | Payer: Medicare Other | Source: Ambulatory Visit | Attending: Hematology | Admitting: Hematology

## 2022-02-20 ENCOUNTER — Inpatient Hospital Stay: Payer: Medicare Other

## 2022-02-20 ENCOUNTER — Encounter (HOSPITAL_COMMUNITY): Payer: Self-pay | Admitting: Radiology

## 2022-02-20 DIAGNOSIS — C3490 Malignant neoplasm of unspecified part of unspecified bronchus or lung: Secondary | ICD-10-CM | POA: Insufficient documentation

## 2022-02-20 DIAGNOSIS — J439 Emphysema, unspecified: Secondary | ICD-10-CM | POA: Diagnosis not present

## 2022-02-20 DIAGNOSIS — C3491 Malignant neoplasm of unspecified part of right bronchus or lung: Secondary | ICD-10-CM | POA: Insufficient documentation

## 2022-02-20 DIAGNOSIS — R918 Other nonspecific abnormal finding of lung field: Secondary | ICD-10-CM | POA: Diagnosis not present

## 2022-02-20 LAB — CBC WITH DIFFERENTIAL/PLATELET
Abs Immature Granulocytes: 0.01 10*3/uL (ref 0.00–0.07)
Basophils Absolute: 0 10*3/uL (ref 0.0–0.1)
Basophils Relative: 1 %
Eosinophils Absolute: 0.1 10*3/uL (ref 0.0–0.5)
Eosinophils Relative: 2 %
HCT: 41.8 % (ref 39.0–52.0)
Hemoglobin: 14.6 g/dL (ref 13.0–17.0)
Immature Granulocytes: 0 %
Lymphocytes Relative: 30 %
Lymphs Abs: 1.4 10*3/uL (ref 0.7–4.0)
MCH: 33.3 pg (ref 26.0–34.0)
MCHC: 34.9 g/dL (ref 30.0–36.0)
MCV: 95.2 fL (ref 80.0–100.0)
Monocytes Absolute: 0.4 10*3/uL (ref 0.1–1.0)
Monocytes Relative: 9 %
Neutro Abs: 2.7 10*3/uL (ref 1.7–7.7)
Neutrophils Relative %: 58 %
Platelets: 174 10*3/uL (ref 150–400)
RBC: 4.39 MIL/uL (ref 4.22–5.81)
RDW: 12.2 % (ref 11.5–15.5)
WBC: 4.7 10*3/uL (ref 4.0–10.5)
nRBC: 0 % (ref 0.0–0.2)

## 2022-02-20 LAB — COMPREHENSIVE METABOLIC PANEL
ALT: 19 U/L (ref 0–44)
AST: 17 U/L (ref 15–41)
Albumin: 3.8 g/dL (ref 3.5–5.0)
Alkaline Phosphatase: 58 U/L (ref 38–126)
Anion gap: 7 (ref 5–15)
BUN: 15 mg/dL (ref 8–23)
CO2: 27 mmol/L (ref 22–32)
Calcium: 9.1 mg/dL (ref 8.9–10.3)
Chloride: 105 mmol/L (ref 98–111)
Creatinine, Ser: 1.07 mg/dL (ref 0.61–1.24)
GFR, Estimated: >60 mL/min (ref >60)
Glucose, Bld: 109 mg/dL — ABNORMAL HIGH (ref 70–99)
Potassium: 3.8 mmol/L (ref 3.5–5.1)
Sodium: 139 mmol/L (ref 135–145)
Total Bilirubin: 0.8 mg/dL (ref 0.3–1.2)
Total Protein: 6.6 g/dL (ref 6.5–8.1)

## 2022-02-20 MED ORDER — IOHEXOL 300 MG/ML  SOLN
100.0000 mL | Freq: Once | INTRAMUSCULAR | Status: AC | PRN
  Administered 2022-02-20: 75 mL via INTRAVENOUS

## 2022-02-21 ENCOUNTER — Other Ambulatory Visit: Payer: Medicare Other

## 2022-02-21 ENCOUNTER — Other Ambulatory Visit (HOSPITAL_COMMUNITY): Payer: Medicare Other

## 2022-02-27 ENCOUNTER — Inpatient Hospital Stay: Payer: Medicare Other | Attending: Hematology | Admitting: Hematology

## 2022-02-27 VITALS — BP 110/61 | HR 94 | Temp 97.4°F | Resp 18 | Ht 64.0 in | Wt 164.0 lb

## 2022-02-27 DIAGNOSIS — Z85118 Personal history of other malignant neoplasm of bronchus and lung: Secondary | ICD-10-CM | POA: Insufficient documentation

## 2022-02-27 DIAGNOSIS — Z8546 Personal history of malignant neoplasm of prostate: Secondary | ICD-10-CM | POA: Insufficient documentation

## 2022-02-27 DIAGNOSIS — C3491 Malignant neoplasm of unspecified part of right bronchus or lung: Secondary | ICD-10-CM | POA: Diagnosis not present

## 2022-02-27 DIAGNOSIS — Z08 Encounter for follow-up examination after completed treatment for malignant neoplasm: Secondary | ICD-10-CM | POA: Diagnosis not present

## 2022-02-27 NOTE — Progress Notes (Signed)
[place],  [address]   CLINIC:  Medical Oncology/Hematology  PCP:  [name], MD [place] [address] [phone]   REASON FOR VISIT:  Follow-up for stage III AA right lung squamous cell carcinoma  PRIOR THERAPY: Chemoradiation therapy  NGS Results: Not done  CURRENT THERAPY: Surveillance  BRIEF ONCOLOGIC HISTORY:  Oncology History Overview Note  H/O Stage IIIA poorly differentiated squamous cell carcinoma of right lung (TXN2M0) treated with concurrent chemoradiation consisting of weekly Carbo/Taxo (02/28/2012- 5/72/6203) complicated by hospitalizations (2) and issues with thrombocytopenia resulting in completing 6/8 cycles of chemotherapy.  He underwent XRT from 02/25/2012- 04/29/2012.   Squamous cell carcinoma of right lung ([facility])  12/03/2011 PET scan   Persistent focal uptake in the rectoanal bowel, benign inflammatory possible neoplastic process cannot be excluded. Multiple enlarging hypermetabolic mediastinal lymph nodes concerning for malignancy. RML density favoring benign etiology   12/13/2011 Procedure   Colonoscopy found tubular adenomas at 75 cm and 40 cm   01/14/2012 Imaging   CT chest-no significant interval change in mediastinal lymphadenopathy when compared to 11/2011.  Spiculated opacity in the right upper lobe not significantly changed from 11/2011. Stable RML noncalcified on her nodules. Diffuse emphysema.   02/08/2012 Procedure   Bronchoscopy revealing chronic bronchitis, multiple mucous plugs are present and lavaged out from the lung. Needle aspiration of mediastinal lymph node performed.   02/08/2012 Pathology Results   Needle aspiration of mediastinal lymph node-Poorly differentiated squamous cell carcinoma   02/19/2012 Imaging   MRI brain-negative   02/19/2012 Cancer Staging   Clinically Stage IIIA   02/25/2012 - 04/29/2012 Radiation Therapy   Dr. Earle Gell   02/28/2012 - 04/17/2012  Chemotherapy   Weekly Carbo/Taxol requiring dose reduction due to thrombocytopenia/hospitalization.   03/27/2012 Treatment Plan Change   Treatment held due to thrombocytopenia/hospitalization   04/10/2012 Treatment Plan Change   Treatment held due to thrombocytopenia/hospitalization   04/17/2012 Treatment Plan Change   Patient refused any further chemotherapy.   05/19/2015 Imaging   CT chest- Mild interval increase in the prominence of chronic irregular densities in the anterior right mid lung. Most compatible with areas of scarring and atelectasis. Recommend CT follow-up in 3-6 months. Mildly prominent right hilar lymph nodes.   08/17/2015 Imaging   CT chest- Areas of architectural distortion, bronchiectasis + consolidation in the RUL + RML are presumably treatment related. Given absence of a recent prior comparison examination, it is difficult to definitively exclude residual disease.     CANCER STAGING: Cancer Staging  Squamous cell carcinoma of right lung (HCC) Staging form: Lung, AJCC 7th Edition - Clinical: Stage Unknown (TX, N2, M0) - Signed by Baird Cancer, PA-C on 07/15/2015    INTERVAL HISTORY:  Mr. Mau 81 y.o. male seen for follow-up of right lung squamous cell cancer.  He denies any recent infections of the lung in the last 6 months.  Denies any new onset pains.    REVIEW OF SYSTEMS:  Review of Systems  Respiratory:  Positive for shortness of breath (On exertion).   Neurological:  Positive for numbness.  Psychiatric/Behavioral:  Positive for sleep disturbance.   All other systems reviewed and are negative.    PAST MEDICAL/SURGICAL HISTORY:  Past Medical History:  Diagnosis Date   Arthritis    Carotid artery disease ([facility])    Status post bilateral CEA - Dr. Donnetta Hutching   Constipation    COPD (chronic obstructive pulmonary disease) ([facility])  DDD (degenerative disc disease), cervical    Degenerative disc disease, lumbar    Essential hypertension     Hyperlipidemia    Macular degeneration    Prostate cancer (HCC)    Skin cancer, basal cell    Squamous cell lung cancer ([place]) 07/15/2015   Status post chemotherapy and XRT - Dr. Julien Nordmann   Tubular adenoma of colon    Past Surgical History:  Procedure Laterality Date   CATARACT EXTRACTION W/ INTRAOCULAR LENS  IMPLANT, BILATERAL Bilateral    COLONOSCOPY     COLONOSCOPY WITH PROPOFOL N/A 05/17/2017   Procedure: COLONOSCOPY WITH PROPOFOL;  Surgeon: Rogene Houston, MD;  [facility];  Service: Endoscopy;  Laterality: N/A;  10:15   ENDARTERECTOMY Right 08/29/2015   Procedure: RIGHT CAROTID ENDARTERECTOMY WITH PATCH ANGIOPLASTY;  Surgeon: Rosetta Posner, MD;  Location: [facility];  Service: Vascular;  Laterality: Right;   ENDARTERECTOMY Left 10/07/2015   Procedure: LEFT CAROTID ARTERY ENDARTERECTOMY;  Surgeon: Rosetta Posner, MD;  Location: [facility];  Service: Vascular;  Laterality: Left;   NOSE SURGERY     "rebulit my nose; related to skin cancer"   PATCH ANGIOPLASTY Left 10/07/2015   Procedure: With [place];  Surgeon: Rosetta Posner, MD;  Location: [facility];  Service: Vascular;  Laterality: Left;   POLYPECTOMY  05/17/2017   Procedure: POLYPECTOMY;  Surgeon: Rogene Houston, MD;  [facility];  Service: Endoscopy;;  colon    PROSTATE BIOPSY     SKIN CANCER EXCISION Right    neck   TONSILLECTOMY       SOCIAL HISTORY:  Social History   Socioeconomic History   Marital status: Married    Spouse name: Not on file   Number of children: Not on file   Years of education: Not on file   Highest education level: Not on file  Occupational History   Not on file  Tobacco Use   Smoking status: Every Day    Packs/day: 2.00    Years: 69.00    Total pack years: 138.00    Types: Cigarettes    Start date: 04[birth year]03/1946   Smokeless tobacco: Never  Vaping Use   Vaping Use: Never used  Substance and Sexual Activity   Alcohol use: Yes    Alcohol/week: 35.0 standard  drinks of alcohol    Types: 35 Cans of beer per week    Comment: 10/07/2015 "4-6 beers/day"   Drug use: No   Sexual activity: Not on file  Other Topics Concern   Not on file  Social History Narrative   Not on file   Social Determinants of Health   Financial Resource Strain: Not on file  Food Insecurity: Not on file  Transportation Needs: Not on file  Physical Activity: Not on file  Stress: Not on file  Social Connections: Not on file  Intimate Partner Violence: Not on file    FAMILY HISTORY:  Family History  Problem Relation Age of Onset   Cancer Mother    Stroke Father    Heart disease Father    Diabetes Mellitus II Sister     CURRENT MEDICATIONS:  Outpatient Encounter Medications as of 02/27/2022  Medication Sig Note   aspirin 325 MG tablet Take 1 tablet (325 mg total) by mouth daily.    atorvastatin (LIPITOR) 10 MG tablet Take 10 mg by mouth daily.    bevacizumab (AVASTIN) 400 MG/16ML SOLN 1.25 mg every 8 (eight) weeks. Left eye  Cyanocobalamin (VITAMIN B-12) 2500 MCG SUBL Place 2,500 mcg under the tongue daily.    FLUoxetine (PROZAC) 20 MG capsule Take 20 mg by mouth daily.    latanoprost (XALATAN) 0.005 % ophthalmic solution Place 1 drop into both eyes at bedtime.    menthol-zinc oxide (GOLD BOND) powder Apply 1 application topically daily as needed (for itching).    Multiple Vitamins-Minerals (PRESERVISION AREDS 2) CAPS Take 1 capsule by mouth 2 (two) times daily.    naproxen sodium (ANAPROX) 220 MG tablet Take 440 mg by mouth 2 (two) times daily as needed (for pain).    tamsulosin (FLOMAX) 0.4 MG CAPS capsule TAKE 1 CAPSULE BY MOUTH DAILY    telmisartan (MICARDIS) 80 MG tablet Take 80 mg by mouth daily. 02/17/2018: " I take 1/2 a tablet"   No facility-administered encounter medications on file as of 02/27/2022.    ALLERGIES:  Allergies  Allergen Reactions   Bupropion Hives     PHYSICAL EXAM:  ECOG Performance status: 1  Vitals:   02/27/22 1518  BP:  110/61  Pulse: 94  Resp: 18  Temp: (!) 97.4 F (36.3 C)  SpO2: 97%   Filed Weights   02/27/22 1518  Weight: 164 lb (74.4 kg)   Physical Exam Vitals reviewed.  Constitutional:      Appearance: Normal appearance.  Cardiovascular:     Rate and Rhythm: Normal rate and regular rhythm.     Heart sounds: Normal heart sounds.  Pulmonary:     Effort: Pulmonary effort is normal.     Breath sounds: Normal breath sounds.  Neurological:     Mental Status: He is alert.  Psychiatric:        Mood and Affect: Mood normal.        Behavior: Behavior normal.      LABORATORY DATA:  I have reviewed the labs as listed.  CBC    Component Value Date/Time   WBC 4.7 02/20/2022 1222   RBC 4.39 02/20/2022 1222   HGB 14.6 02/20/2022 1222   HGB 14.7 02/15/2020 1106   HGB 13.8 08/03/2016 1029   HCT 41.8 02/20/2022 1222   HCT 40.9 08/03/2016 1029   PLT 174 02/20/2022 1222   PLT 197 02/15/2020 1106   PLT 157 08/03/2016 1029   MCV 95.2 02/20/2022 1222   MCV 96.2 08/03/2016 1029   MCH 33.3 02/20/2022 1222   MCHC 34.9 02/20/2022 1222   RDW 12.2 02/20/2022 1222   RDW 13.7 08/03/2016 1029   LYMPHSABS 1.4 02/20/2022 1222   LYMPHSABS 1.5 08/03/2016 1029   MONOABS 0.4 02/20/2022 1222   MONOABS 0.5 08/03/2016 1029   EOSABS 0.1 02/20/2022 1222   EOSABS 0.1 08/03/2016 1029   BASOSABS 0.0 02/20/2022 1222   BASOSABS 0.0 08/03/2016 1029      Latest Ref Rng & Units 02/20/2022   12:22 PM 02/20/2021   11:23 AM 02/15/2020   11:06 AM  CMP  Glucose 70 - 99 mg/dL 109  105  98   BUN 8 - 23 mg/dL 15  17  11    Creatinine 0.61 - 1.24 mg/dL 1.07  1.02  1.05   Sodium 135 - 145 mmol/L 139  136  141   Potassium 3.5 - 5.1 mmol/L 3.8  4.0  4.5   Chloride 98 - 111 mmol/L 105  103  107   CO2 22 - 32 mmol/L 27  24  26    Calcium 8.9 - 10.3 mg/dL 9.1  9.3  9.9   Total Protein 6.5 -  8.1 g/dL 6.6  7.2  7.3   Total Bilirubin 0.3 - 1.2 mg/dL 0.8  0.4  0.5   Alkaline Phos 38 - 126 U/L 58  63  79   AST 15 - 41 U/L  17  16  17    ALT 0 - 44 U/L 19  19  21      DIAGNOSTIC IMAGING:  I have independently reviewed the scans and discussed with the patient.  ASSESSMENT:  Stage IIIa (T1 aN2 M0) squamous cell carcinoma of right lung: - Diagnosed in #2013, status post concurrent chemoradiation therapy with carboplatin and paclitaxel weekly, treated in [place], [place]. - He is being followed by Dr. Julien Nordmann in [place] once a year with scans. - Last CT chest with contrast on 02/20/2021 did not show any evidence of recurrence.    Social/family history: - He is recently widowed and lives by himself.  He is retired from [employer] and also worked as a Engineer, agricultural at [employer]. - Current active smoker, smokes 2 to 3 packs/day for the last 74 years. - Mother had "cancer in her abdomen".  Father and 4 paternal uncles had prostate cancer.  3.  Prostate cancer: - Diagnosed in 2013 and had seed implant and "Calypso treatment" with 5 treatments of radiation.  He reports that his most recent PSA was undetectable.     PLAN:  1.  Stage IIIa (T1 N2 M0) right lung squamous cell carcinoma: - Reviewed CT chest (02/20/2022): Small nodules in the left upper lobe measuring 6 mm and 4 mm nonspecific but new from previous imaging.  Other findings are stable. - Recommend follow-up CT scan of the chest in 6 months.  2.  Prostate cancer: - Because of his personal and extensive family history, I have recommended genetic testing.  We will make a referral.      Orders placed this encounter:  Orders Placed This Encounter  Procedures   [place], [place] [phone]

## 2022-02-27 NOTE — Patient Instructions (Signed)
[facility]  Discharge Instructions  You were seen and examined today by Dr. Delton Coombes.  Dr. Delton Coombes discussed your most recent lab work and CT scan which revealed that there is two small spots on your left lung. Your labs look good.  We will repeat scan before your next appointment. Dr. Delton Coombes is going to set you up with Geneticist.  Follow-up as scheduled.    Thank you for choosing [facility] to provide your oncology and hematology care.   To afford each patient quality time with our provider, please arrive at least 15 minutes before your scheduled appointment time. You may need to reschedule your appointment if you arrive late (10 or more minutes). Arriving late affects you and other patients whose appointments are after yours.  Also, if you miss three or more appointments without notifying the office, you may be dismissed from the clinic at the provider's discretion.    Again, thank you for choosing [facility].  Our hope is that these requests will decrease the amount of time that you wait before being seen by our physicians.   If you have a lab appointment with the [facility] please come in thru the Main Entrance and check in at the main information desk.           _____________________________________________________________  Should you have questions after your visit to [facility], please contact our office at [phone] and follow the prompts.  Our office hours are 8:00 a.m. to 4:30 p.m. Monday - Thursday and 8:00 a.m. to 2:30 p.m. Friday.  Please note that voicemails left after 4:00 p.m. may not be returned until the following business day.  We are closed weekends and all major holidays.  You do have access to a nurse 24-7, just call the main number to the clinic [phone] and do not press any options, hold on the line and a nurse will answer the phone.    For prescription refill  requests, have your pharmacy contact our office and allow 72 hours.    Masks are optional in the cancer centers. If you would like for your care team to wear a mask while they are taking care of you, please let them know. You may have one support person who is at least 81 years old accompany you for your appointments.

## 2022-02-28 ENCOUNTER — Ambulatory Visit: Payer: Medicare Other | Admitting: Hematology

## 2022-03-05 ENCOUNTER — Ambulatory Visit: Payer: Medicare Other | Admitting: Dietician

## 2022-03-05 NOTE — Progress Notes (Signed)
Nutrition Assessment   Reason for Assessment: +MST (wt loss, poor appetite)   ASSESSMENT: 81 year old male with SCC of right lung diagnosed in 2013. S/p concurrent chemoradiation with carbo/paclitaxel (treated in [place]). Patient is on surveillance at this time. He is followed by Dr. Delton Coombes.   Spoke with patient via telephone. Introduced self and services available at [facility]. Patient appreciative of call and agreeable to telephone visit. Patient endorses decreased appetite over the past year. He says his wife passed away last 2023-03-16 and this has affected his appetite. Patient reports he has a caregiver that comes in 4 days/week. She prepares meals, takes him to appointments/errands, cleans house. Patient states she is "trying to fatten him up" and recalls scrambled eggs, sausage patty, gravy, biscuit this morning. Patient does not drink milk. He has tried Boost/Ensure in the past but did not like these.     Nutrition Focused Physical Exam: unable to complete (telephone visit)   Medications: B12, MVI, flomax, lipitor, prozac   Labs: 11/28 labs reviewed    Anthropometrics: Pt ~30 lbs (16%) under reported usual weight; concerning   Height: 5'4" Weight: 164 lb  UBW: 195-200 lb (couple of years ago per pt - noted 198 lb 06/10/20) BMI: 28.15    NUTRITION DIAGNOSIS: Unintentional weight loss related to social/environmental circumstances as evidenced by pt report, 16% decrease from usual weight in the last 18 months    INTERVENTION:  Encouraged high calorie high protein foods Encouraged small frequent meals/snacks  Will mail handouts + contact information     MONITORING, EVALUATION, GOAL: weight trends, intake   Next Visit: To be scheduled as needed

## 2022-03-06 DIAGNOSIS — H4311 Vitreous hemorrhage, right eye: Secondary | ICD-10-CM | POA: Diagnosis not present

## 2022-03-06 DIAGNOSIS — H35033 Hypertensive retinopathy, bilateral: Secondary | ICD-10-CM | POA: Diagnosis not present

## 2022-03-06 DIAGNOSIS — H401131 Primary open-angle glaucoma, bilateral, mild stage: Secondary | ICD-10-CM | POA: Diagnosis not present

## 2022-03-06 DIAGNOSIS — Z961 Presence of intraocular lens: Secondary | ICD-10-CM | POA: Diagnosis not present

## 2022-03-06 DIAGNOSIS — H44111 Panuveitis, right eye: Secondary | ICD-10-CM | POA: Diagnosis not present

## 2022-03-06 DIAGNOSIS — H353231 Exudative age-related macular degeneration, bilateral, with active choroidal neovascularization: Secondary | ICD-10-CM | POA: Diagnosis not present

## 2022-03-13 DIAGNOSIS — R7301 Impaired fasting glucose: Secondary | ICD-10-CM | POA: Diagnosis not present

## 2022-03-13 DIAGNOSIS — E782 Mixed hyperlipidemia: Secondary | ICD-10-CM | POA: Diagnosis not present

## 2022-03-20 DIAGNOSIS — Z634 Disappearance and death of family member: Secondary | ICD-10-CM | POA: Diagnosis not present

## 2022-03-20 DIAGNOSIS — K5909 Other constipation: Secondary | ICD-10-CM | POA: Diagnosis not present

## 2022-03-20 DIAGNOSIS — Z85118 Personal history of other malignant neoplasm of bronchus and lung: Secondary | ICD-10-CM | POA: Diagnosis not present

## 2022-03-20 DIAGNOSIS — E782 Mixed hyperlipidemia: Secondary | ICD-10-CM | POA: Diagnosis not present

## 2022-03-20 DIAGNOSIS — F418 Other specified anxiety disorders: Secondary | ICD-10-CM | POA: Diagnosis not present

## 2022-03-20 DIAGNOSIS — Z0001 Encounter for general adult medical examination with abnormal findings: Secondary | ICD-10-CM | POA: Diagnosis not present

## 2022-03-20 DIAGNOSIS — I2510 Atherosclerotic heart disease of native coronary artery without angina pectoris: Secondary | ICD-10-CM | POA: Diagnosis not present

## 2022-03-20 DIAGNOSIS — E875 Hyperkalemia: Secondary | ICD-10-CM | POA: Diagnosis not present

## 2022-03-20 DIAGNOSIS — I998 Other disorder of circulatory system: Secondary | ICD-10-CM | POA: Diagnosis not present

## 2022-03-20 DIAGNOSIS — H3530 Unspecified macular degeneration: Secondary | ICD-10-CM | POA: Diagnosis not present

## 2022-03-20 DIAGNOSIS — I10 Essential (primary) hypertension: Secondary | ICD-10-CM | POA: Diagnosis not present

## 2022-03-20 DIAGNOSIS — J449 Chronic obstructive pulmonary disease, unspecified: Secondary | ICD-10-CM | POA: Diagnosis not present

## 2022-04-09 ENCOUNTER — Encounter (INDEPENDENT_AMBULATORY_CARE_PROVIDER_SITE_OTHER): Payer: Self-pay | Admitting: *Deleted

## 2022-05-29 DIAGNOSIS — H353231 Exudative age-related macular degeneration, bilateral, with active choroidal neovascularization: Secondary | ICD-10-CM | POA: Diagnosis not present

## 2022-08-07 DIAGNOSIS — H35033 Hypertensive retinopathy, bilateral: Secondary | ICD-10-CM | POA: Diagnosis not present

## 2022-08-07 DIAGNOSIS — H401131 Primary open-angle glaucoma, bilateral, mild stage: Secondary | ICD-10-CM | POA: Diagnosis not present

## 2022-08-07 DIAGNOSIS — H353231 Exudative age-related macular degeneration, bilateral, with active choroidal neovascularization: Secondary | ICD-10-CM | POA: Diagnosis not present

## 2022-08-27 ENCOUNTER — Other Ambulatory Visit: Payer: Self-pay

## 2022-08-27 DIAGNOSIS — C3491 Malignant neoplasm of unspecified part of right bronchus or lung: Secondary | ICD-10-CM

## 2022-08-28 ENCOUNTER — Inpatient Hospital Stay: Payer: Medicare Other

## 2022-08-28 ENCOUNTER — Ambulatory Visit (HOSPITAL_COMMUNITY): Payer: Medicare Other

## 2022-08-28 ENCOUNTER — Other Ambulatory Visit: Payer: Medicare Other

## 2022-08-29 ENCOUNTER — Ambulatory Visit: Payer: Medicare Other | Admitting: Hematology

## 2022-09-04 ENCOUNTER — Ambulatory Visit (HOSPITAL_COMMUNITY)
Admission: RE | Admit: 2022-09-04 | Discharge: 2022-09-04 | Disposition: A | Discharge status: Home / Self Care | Payer: Medicare Other | Source: Ambulatory Visit | Attending: Hematology | Admitting: Hematology

## 2022-09-04 ENCOUNTER — Inpatient Hospital Stay: Payer: Medicare Other | Admitting: Hematology

## 2022-09-04 ENCOUNTER — Inpatient Hospital Stay: Payer: Medicare Other | Attending: Hematology

## 2022-09-04 DIAGNOSIS — Z85118 Personal history of other malignant neoplasm of bronchus and lung: Secondary | ICD-10-CM | POA: Insufficient documentation

## 2022-09-04 DIAGNOSIS — C3491 Malignant neoplasm of unspecified part of right bronchus or lung: Secondary | ICD-10-CM | POA: Insufficient documentation

## 2022-09-04 DIAGNOSIS — Z8546 Personal history of malignant neoplasm of prostate: Secondary | ICD-10-CM | POA: Insufficient documentation

## 2022-09-04 DIAGNOSIS — R911 Solitary pulmonary nodule: Secondary | ICD-10-CM | POA: Diagnosis not present

## 2022-09-04 DIAGNOSIS — Z08 Encounter for follow-up examination after completed treatment for malignant neoplasm: Secondary | ICD-10-CM | POA: Insufficient documentation

## 2022-09-04 DIAGNOSIS — C3490 Malignant neoplasm of unspecified part of unspecified bronchus or lung: Secondary | ICD-10-CM | POA: Diagnosis not present

## 2022-09-04 LAB — COMPREHENSIVE METABOLIC PANEL
ALT: 57 U/L — ABNORMAL HIGH (ref 0–44)
AST: 28 U/L (ref 15–41)
Albumin: 3.5 g/dL (ref 3.5–5.0)
Alkaline Phosphatase: 65 U/L (ref 38–126)
Anion gap: 9 (ref 5–15)
BUN: 17 mg/dL (ref 8–23)
CO2: 26 mmol/L (ref 22–32)
Calcium: 9.1 mg/dL (ref 8.9–10.3)
Chloride: 102 mmol/L (ref 98–111)
Creatinine, Ser: 1.13 mg/dL (ref 0.61–1.24)
GFR, Estimated: >60 mL/min (ref >60)
Glucose, Bld: 112 mg/dL — ABNORMAL HIGH (ref 70–99)
Potassium: 3.8 mmol/L (ref 3.5–5.1)
Sodium: 137 mmol/L (ref 135–145)
Total Bilirubin: 0.8 mg/dL (ref 0.3–1.2)
Total Protein: 6.7 g/dL (ref 6.5–8.1)

## 2022-09-04 LAB — CBC WITH DIFFERENTIAL/PLATELET
Abs Immature Granulocytes: 0 10*3/uL (ref 0.00–0.07)
Basophils Absolute: 0.1 10*3/uL (ref 0.0–0.1)
Basophils Relative: 1 %
Eosinophils Absolute: 0.1 10*3/uL (ref 0.0–0.5)
Eosinophils Relative: 1 %
HCT: 40.1 % (ref 39.0–52.0)
Hemoglobin: 13.5 g/dL (ref 13.0–17.0)
Lymphocytes Relative: 62 %
Lymphs Abs: 4.7 10*3/uL — ABNORMAL HIGH (ref 0.7–4.0)
MCH: 32 pg (ref 26.0–34.0)
MCHC: 33.7 g/dL (ref 30.0–36.0)
MCV: 95 fL (ref 80.0–100.0)
Monocytes Absolute: 0.4 10*3/uL (ref 0.1–1.0)
Monocytes Relative: 5 %
Neutro Abs: 2.4 10*3/uL (ref 1.7–7.7)
Neutrophils Relative %: 31 %
Platelets: 237 10*3/uL (ref 150–400)
RBC: 4.22 MIL/uL (ref 4.22–5.81)
RDW: 13.2 % (ref 11.5–15.5)
WBC: 7.6 10*3/uL (ref 4.0–10.5)
nRBC: 0 % (ref 0.0–0.2)

## 2022-09-04 MED ORDER — IOHEXOL 300 MG/ML  SOLN
75.0000 mL | Freq: Once | INTRAMUSCULAR | Status: AC | PRN
  Administered 2022-09-04: 75 mL via INTRAVENOUS

## 2022-09-11 DIAGNOSIS — R7301 Impaired fasting glucose: Secondary | ICD-10-CM | POA: Diagnosis not present

## 2022-09-11 DIAGNOSIS — Z125 Encounter for screening for malignant neoplasm of prostate: Secondary | ICD-10-CM | POA: Diagnosis not present

## 2022-09-11 DIAGNOSIS — E782 Mixed hyperlipidemia: Secondary | ICD-10-CM | POA: Diagnosis not present

## 2022-09-17 NOTE — Progress Notes (Signed)
[facility] [address] [place] [address]    Clinic Day:  09/18/2022  Referring physician: Benita Stabile, MD  Patient Care Team: Benita Stabile, MD as PCP - General (Internal Medicine) Doreatha Massed, MD as Medical Oncologist (Medical Oncology)   ASSESSMENT & PLAN:   Assessment: 1. Stage IIIa (T1 aN2 M0) squamous cell carcinoma of right lung: - Diagnosed in #2013, status post concurrent chemoradiation therapy with carboplatin and paclitaxel weekly, treated in [place], [place]. - He is being followed by Dr. Arbutus Ped in [place] once a year with scans. - Last CT chest with contrast on 02/20/2021 did not show any evidence of recurrence.  2. Social/family history: - He is recently widowed and lives by himself.  He is retired from [employer] and also worked as a Audiological scientist at [employer]. - Current active smoker, smokes 2 to 3 packs/day for the last 74 years. - Mother had "cancer in her abdomen".  Father and 4 paternal uncles had prostate cancer.  3.  Prostate cancer: - Diagnosed in 2013 and had seed implant and "Calypso treatment" with 5 treatments of radiation.  He reports that his most recent PSA was undetectable.    Plan: 1.  Stage IIIa (T1 N2 M0) right lung squamous cell carcinoma: - I reviewed CT chest with contrast from 09/04/2022: Enlarged right hilar lymph node measuring 14 mm, previously 12 mm.  New part solid nodule of right upper lobe measuring 11 x 8 mm with 4 mm solid component.  Irregular solid left upper lobe lung nodule increased in size. - I have recommended PET scan and follow-up.   2.  Prostate cancer: - Because of personal history of prostate cancer and extensive family history, I have recommended genetics evaluation.    Orders Placed This Encounter  Procedures   NM PET Image Restag (PS) Skull Base To Thigh    Standing Status:   Future    Standing Expiration Date:   09/18/2023    Order Specific Question:    If indicated for the ordered procedure, I authorize the administration of a radiopharmaceutical per Radiology protocol    Answer:   Yes    Order Specific Question:   Preferred imaging location?    Answer:   [facility]      I,Katie Daubenspeck,acting as a Neurosurgeon for [employer], MD.,have documented all relevant documentation on the behalf of Doreatha Massed, MD,as directed by  Doreatha Massed, MD while in the presence of Doreatha Massed, MD.   I, Doreatha Massed MD, have reviewed the above documentation for accuracy and completeness, and I agree with the above.   Doreatha Massed, MD   6/25/20245:55 PM  CHIEF COMPLAINT:   Diagnosis: right lung squamous cell carcinoma    Cancer Staging  Squamous cell carcinoma of right lung (HCC) Staging form: Lung, AJCC 7th Edition - Clinical: Stage Unknown (TX, N2, M0) - Signed by Ellouise Newer, PA-C on 07/15/2015    Prior Therapy: chemoradiation therapy  Current Therapy:  surveillance   HISTORY OF PRESENT ILLNESS:   Oncology History Overview Note  H/O Stage IIIA poorly differentiated squamous cell carcinoma of right lung (TXN2M0) treated with concurrent chemoradiation consisting of weekly Carbo/Taxo (02/28/2012- 04/17/2012) complicated by hospitalizations (2) and issues with thrombocytopenia resulting in completing 6/8 cycles of chemotherapy.  He underwent XRT from 02/25/2012- 04/29/2012.   Squamous cell carcinoma of right lung (HCC)  12/03/2011 PET scan   Persistent focal uptake in the rectoanal  bowel, benign inflammatory possible neoplastic process cannot be excluded. Multiple enlarging hypermetabolic mediastinal lymph nodes concerning for malignancy. RML density favoring benign etiology   12/13/2011 Procedure   Colonoscopy found tubular adenomas at 75 cm and 40 cm   01/14/2012 Imaging   CT chest-no significant interval change in mediastinal lymphadenopathy when compared to 11/2011.  Spiculated opacity in the  right upper lobe not significantly changed from 11/2011. Stable RML noncalcified on her nodules. Diffuse emphysema.   02/08/2012 Procedure   Bronchoscopy revealing chronic bronchitis, multiple mucous plugs are present and lavaged out from the lung. Needle aspiration of mediastinal lymph node performed.   02/08/2012 Pathology Results   Needle aspiration of mediastinal lymph node-Poorly differentiated squamous cell carcinoma   02/19/2012 Imaging   MRI brain-negative   02/19/2012 Cancer Staging   Clinically Stage IIIA   02/25/2012 - 04/29/2012 Radiation Therapy   Dr. Lissa Merlin   02/28/2012 - 04/17/2012 Chemotherapy   Weekly Carbo/Taxol requiring dose reduction due to thrombocytopenia/hospitalization.   03/27/2012 Treatment Plan Change   Treatment held due to thrombocytopenia/hospitalization   04/10/2012 Treatment Plan Change   Treatment held due to thrombocytopenia/hospitalization   04/17/2012 Treatment Plan Change   Patient refused any further chemotherapy.   05/19/2015 Imaging   CT chest- Mild interval increase in the prominence of chronic irregular densities in the anterior right mid lung. Most compatible with areas of scarring and atelectasis. Recommend CT follow-up in 3-6 months. Mildly prominent right hilar lymph nodes.   08/17/2015 Imaging   CT chest- Areas of architectural distortion, bronchiectasis + consolidation in the RUL + RML are presumably treatment related. Given absence of a recent prior comparison examination, it is difficult to definitively exclude residual disease.      INTERVAL HISTORY:   Eric Hinton is a 82 y.o. male presenting to clinic today for follow up of right lung squamous cell carcinoma. He was last seen by me on 02/27/22.  Since his last visit, he underwent surveillance chest CT on 09/04/22 showing: slight increase in size of enlarged right hilar lymph node; increase in size of irregular solid LUL pulmonary nodule; new part-solid nodule of RUL.  Today, he states that he  is doing well overall. His appetite level is at 80%. His energy level is at 40%.  PAST MEDICAL HISTORY:   Past Medical History: Past Medical History:  Diagnosis Date   Arthritis    Carotid artery disease (HCC)    Status post bilateral CEA - Dr. Arbie Cookey   Constipation    COPD (chronic obstructive pulmonary disease) (HCC)    DDD (degenerative disc disease), cervical    Degenerative disc disease, lumbar    Essential hypertension    Hyperlipidemia    Macular degeneration    Prostate cancer (HCC)    Skin cancer, basal cell    Squamous cell lung cancer (HCC) 07/15/2015   Status post chemotherapy and XRT - Dr. Arbutus Ped   Tubular adenoma of colon     Surgical History: Past Surgical History:  Procedure Laterality Date   CATARACT EXTRACTION W/ INTRAOCULAR LENS  IMPLANT, BILATERAL Bilateral    COLONOSCOPY     COLONOSCOPY WITH PROPOFOL N/A 05/17/2017   Procedure: COLONOSCOPY WITH PROPOFOL;  Surgeon: Malissa Hippo, MD;  [facility];  Service: Endoscopy;  Laterality: N/A;  10:15   ENDARTERECTOMY Right 08/29/2015   Procedure: RIGHT CAROTID ENDARTERECTOMY WITH PATCH ANGIOPLASTY;  Surgeon: Larina Earthly, MD;  Location: [facility] OR;  Service: Vascular;  Laterality: Right;   ENDARTERECTOMY Left 10/07/2015  Procedure: LEFT CAROTID ARTERY ENDARTERECTOMY;  Surgeon: Larina Earthly, MD;  Location: [facility] OR;  Service: Vascular;  Laterality: Left;   NOSE SURGERY     "rebulit my nose; related to skin cancer"   PATCH ANGIOPLASTY Left 10/07/2015   Procedure: With HEMASHIELD PLATINUM PATCH ANGIOPLASTY;  Surgeon: Larina Earthly, MD;  Location: [facility] OR;  Service: Vascular;  Laterality: Left;   POLYPECTOMY  05/17/2017   Procedure: POLYPECTOMY;  Surgeon: Malissa Hippo, MD;  Location: [facility];  Service: Endoscopy;;  colon    PROSTATE BIOPSY     SKIN CANCER EXCISION Right    neck   TONSILLECTOMY      Social History: Social History   Socioeconomic History   Marital status: Married    Spouse name: Not  on file   Number of children: Not on file   Years of education: Not on file   Highest education level: Not on file  Occupational History   Not on file  Tobacco Use   Smoking status: Every Day    Packs/day: 2.00    Years: 69.00    Additional pack years: 0.00    Total pack years: 138.00    Types: Cigarettes    Start date: [birth year]03/1946   Smokeless tobacco: Never  Vaping Use   Vaping Use: Never used  Substance and Sexual Activity   Alcohol use: Yes    Alcohol/week: 35.0 standard drinks of alcohol    Types: 35 Cans of beer per week    Comment: 10/07/2015 "4-6 beers/day"   Drug use: No   Sexual activity: Not on file  Other Topics Concern   Not on file  Social History Narrative   Not on file   Social Determinants of Health   Financial Resource Strain: Not on file  Food Insecurity: Not on file  Transportation Needs: Not on file  Physical Activity: Not on file  Stress: Not on file  Social Connections: Not on file  Intimate Partner Violence: Not on file    Family History: Family History  Problem Relation Age of Onset   Cancer Mother    Stroke Father    Heart disease Father    Diabetes Mellitus II Sister     Current Medications:  Current Outpatient Medications:    aspirin 325 MG tablet, Take 1 tablet (325 mg total) by mouth daily., Disp: , Rfl:    atorvastatin (LIPITOR) 10 MG tablet, Take 10 mg by mouth daily., Disp: , Rfl:    bevacizumab (AVASTIN) 400 MG/16ML SOLN, 1.25 mg every 8 (eight) weeks. Left eye, Disp: , Rfl:    Cyanocobalamin (VITAMIN B-12) 2500 MCG SUBL, Place 2,500 mcg under the tongue daily., Disp: , Rfl:    FLUoxetine (PROZAC) 20 MG capsule, Take 20 mg by mouth daily., Disp: , Rfl:    latanoprost (XALATAN) 0.005 % ophthalmic solution, Place 1 drop into both eyes at bedtime., Disp: , Rfl:    menthol-zinc oxide (GOLD BOND) powder, Apply 1 application topically daily as needed (for itching)., Disp: , Rfl:    Multiple Vitamins-Minerals (PRESERVISION AREDS 2)  CAPS, Take 1 capsule by mouth 2 (two) times daily., Disp: , Rfl:    naproxen sodium (ANAPROX) 220 MG tablet, Take 440 mg by mouth 2 (two) times daily as needed (for pain)., Disp: , Rfl:    tamsulosin (FLOMAX) 0.4 MG CAPS capsule, TAKE 1 CAPSULE BY MOUTH DAILY, Disp: 30 capsule, Rfl: 0   telmisartan (MICARDIS) 80 MG tablet, Take 80 mg by mouth daily.,  Disp: , Rfl:    Allergies: Allergies  Allergen Reactions   Bupropion Hives    REVIEW OF SYSTEMS:   Review of Systems  Constitutional:  Negative for chills, fatigue and fever.  HENT:   Negative for lump/mass, mouth sores, nosebleeds, sore throat and trouble swallowing.   Eyes:  Negative for eye problems.  Respiratory:  Positive for shortness of breath. Negative for cough.   Cardiovascular:  Negative for chest pain, leg swelling and palpitations.  Gastrointestinal:  Negative for abdominal pain, constipation, diarrhea, nausea and vomiting.  Genitourinary:  Negative for bladder incontinence, difficulty urinating, dysuria, frequency, hematuria and nocturia.   Musculoskeletal:  Negative for arthralgias, back pain, flank pain, myalgias and neck pain.  Skin:  Negative for itching and rash.  Neurological:  Positive for numbness. Negative for dizziness and headaches.  Hematological:  Does not bruise/bleed easily.  Psychiatric/Behavioral:  Positive for depression and sleep disturbance. Negative for suicidal ideas. The patient is not nervous/anxious.   All other systems reviewed and are negative.    VITALS:   Blood pressure 136/65, pulse (!) 103, temperature 98.2 F (36.8 C), temperature source Oral, resp. rate 18, height 5\' 4"  (1.626 m), weight 173 lb 9.6 oz (78.7 kg), SpO2 100 %.  Wt Readings from Last 3 Encounters:  09/18/22 173 lb 9.6 oz (78.7 kg)  02/27/22 164 lb (74.4 kg)  09/20/21 167 lb 6.4 oz (75.9 kg)    Body mass index is 29.8 kg/m.  Performance status (ECOG): 1 - Symptomatic but completely ambulatory  PHYSICAL EXAM:    Physical Exam Vitals and nursing note reviewed. Exam conducted with a chaperone present.  Constitutional:      Appearance: Normal appearance.  Cardiovascular:     Rate and Rhythm: Normal rate and regular rhythm.     Pulses: Normal pulses.     Heart sounds: Normal heart sounds.  Pulmonary:     Effort: Pulmonary effort is normal.     Breath sounds: Normal breath sounds.  Abdominal:     Palpations: Abdomen is soft. There is no hepatomegaly, splenomegaly or mass.     Tenderness: There is no abdominal tenderness.  Musculoskeletal:     Right lower leg: No edema.     Left lower leg: No edema.  Lymphadenopathy:     Cervical: No cervical adenopathy.     Right cervical: No superficial, deep or posterior cervical adenopathy.    Left cervical: No superficial, deep or posterior cervical adenopathy.     Upper Body:     Right upper body: No supraclavicular or axillary adenopathy.     Left upper body: No supraclavicular or axillary adenopathy.  Neurological:     General: No focal deficit present.     Mental Status: He is alert and oriented to person, place, and time.  Psychiatric:        Mood and Affect: Mood normal.        Behavior: Behavior normal.     LABS:      Latest Ref Rng & Units 09/04/2022   12:07 PM 02/20/2022   12:22 PM 02/20/2021   10:58 AM  CBC  WBC 4.0 - 10.5 K/uL 7.6  4.7  5.0   Hemoglobin 13.0 - 17.0 g/dL 88.4  16.6  06.3   Hematocrit 39.0 - 52.0 % 40.1  41.8  46.0   Platelets 150 - 400 K/uL 237  174  173       Latest Ref Rng & Units 09/04/2022   12:07 PM 02/20/2022  12:22 PM 02/20/2021   11:23 AM  CMP  Glucose 70 - 99 mg/dL 528  413  244   BUN 8 - 23 mg/dL 17  15  17    Creatinine 0.61 - 1.24 mg/dL 0.10  2.72  5.36   Sodium 135 - 145 mmol/L 137  139  136   Potassium 3.5 - 5.1 mmol/L 3.8  3.8  4.0   Chloride 98 - 111 mmol/L 102  105  103   CO2 22 - 32 mmol/L 26  27  24    Calcium 8.9 - 10.3 mg/dL 9.1  9.1  9.3   Total Protein 6.5 - 8.1 g/dL 6.7  6.6  7.2    Total Bilirubin 0.3 - 1.2 mg/dL 0.8  0.8  0.4   Alkaline Phos 38 - 126 U/L 65  58  63   AST 15 - 41 U/L 28  17  16    ALT 0 - 44 U/L 57  19  19      No results found for: "CEA1", "CEA" / No results found for: "CEA1", "CEA" No results found for: "PSA1" No results found for: "UYQ034" No results found for: "CAN125"  No results found for: "TOTALPROTELP", "ALBUMINELP", "A1GS", "A2GS", "BETS", "BETA2SER", "GAMS", "MSPIKE", "SPEI" No results found for: "TIBC", "FERRITIN", "IRONPCTSAT" No results found for: "LDH"   STUDIES:   CT Chest W Contrast  Result Date: 09/10/2022 CLINICAL DATA:  Lung cancer follow-up; * Tracking Code: BO * EXAM: CT CHEST WITH CONTRAST TECHNIQUE: Multidetector CT imaging of the chest was performed during intravenous contrast administration. RADIATION DOSE REDUCTION: This exam was performed according to the departmental dose-optimization program which includes automated exposure control, adjustment of the mA and/or kV according to patient size and/or use of iterative reconstruction technique. CONTRAST:  75mL OMNIPAQUE IOHEXOL 300 MG/ML  SOLN COMPARISON:  Multiple priors, most recent chest CT dated February 20, 2022 FINDINGS: Cardiovascular: Normal heart size. No pericardial effusion. Severe aortic valve calcifications, unchanged when compared with the prior. Normal caliber thoracic aorta with severe atherosclerotic disease. Mitral annular calcifications. Moderate coronary artery calcifications. Mediastinum/Nodes: Esophagus and thyroid are unremarkable. Enlarged right hilar lymph node measuring 14 mm on series 2, image 91, previously 12 mm. No enlarged mediastinal or left hilar lymph nodes. Lungs/Pleura: Central airways are patent. Stable right upper lobe and right middle lobe posttreatment changes interval increased size of irregular left upper lobe pulmonary nodule, previously two adjacent small nodules, measures 11 x 7 mm on series 5 image 32, previously nodules measured 6 and 4  mm. New part solid nodule of the right upper lobe measuring 11 x 8 mm with 4 mm solid component. No pleural effusion. Upper Abdomen: Low-attenuation liver lesions are unchanged when compared with the prior exam and likely simple cysts. No acute abnormality. Musculoskeletal: No aggressive appearing osseous lesions. IMPRESSION: 1. Enlarged right hilar lymph node is slightly increased in size and concerning for metastatic disease. Consider PET-CT for further evaluation. 2. Irregular solid left upper lobe pulmonary nodule is increased in size and concerning for synchronous primary malignancy. Could also be further evaluated with PET-CT. 3. New part solid nodule of the right upper lobe. Recommend attention on follow-up. 4. Aortic valve calcifications, coronary artery calcifications, and aortic atherosclerosis (ICD10-I70.0). Electronically Signed   By: Allegra Lai M.D.   On: 09/10/2022 21:47

## 2022-09-18 ENCOUNTER — Inpatient Hospital Stay (HOSPITAL_BASED_OUTPATIENT_CLINIC_OR_DEPARTMENT_OTHER): Payer: Medicare Other | Admitting: Hematology

## 2022-09-18 VITALS — BP 136/65 | HR 103 | Temp 98.2°F | Resp 18 | Ht 64.0 in | Wt 173.6 lb

## 2022-09-18 DIAGNOSIS — Z08 Encounter for follow-up examination after completed treatment for malignant neoplasm: Secondary | ICD-10-CM | POA: Diagnosis not present

## 2022-09-18 DIAGNOSIS — Z85118 Personal history of other malignant neoplasm of bronchus and lung: Secondary | ICD-10-CM | POA: Diagnosis not present

## 2022-09-18 DIAGNOSIS — C3491 Malignant neoplasm of unspecified part of right bronchus or lung: Secondary | ICD-10-CM | POA: Diagnosis not present

## 2022-09-18 DIAGNOSIS — Z8546 Personal history of malignant neoplasm of prostate: Secondary | ICD-10-CM | POA: Diagnosis not present

## 2022-09-18 NOTE — Patient Instructions (Addendum)
[facility] at [facility] Discharge Instructions   You were seen and examined today by Dr. Ellin Saba.  He reviewed the results of your CT scan. It is showing there is a new area in the lymph nodes in the chest and that the spots have gotten bigger.   We will obtain a special scan called a PET scan to investigate this further.   We will refer you back to genetics.  Return as scheduled.    Thank you for choosing  Cancer Center at [facility] to provide your oncology and hematology care.  To afford each patient quality time with our provider, please arrive at least 15 minutes before your scheduled appointment time.   If you have a lab appointment with the Cancer Center please come in thru the Main Entrance and check in at the main information desk.  You need to re-schedule your appointment should you arrive 10 or more minutes late.  We strive to give you quality time with our providers, and arriving late affects you and other patients whose appointments are after yours.  Also, if you no show three or more times for appointments you may be dismissed from the clinic at the providers discretion.     Again, thank you for choosing [facility].  Our hope is that these requests will decrease the amount of time that you wait before being seen by our physicians.       _____________________________________________________________  Should you have questions after your visit to [facility], please contact our office at [phone] and follow the prompts.  Our office hours are 8:00 a.m. and 4:30 p.m. Monday - Friday.  Please note that voicemails left after 4:00 p.m. may not be returned until the following business day.  We are closed weekends and major holidays.  You do have access to a nurse 24-7, just call the main number to the clinic [phone] and do not press any options, hold on the line and a nurse will answer the phone.     For prescription refill requests, have your pharmacy contact our office and allow 72 hours.    Due to Covid, you will need to wear a mask upon entering the hospital. If you do not have a mask, a mask will be given to you at the Main Entrance upon arrival. For doctor visits, patients may have 1 support person age 56 or older with them. For treatment visits, patients can not have anyone with them due to social distancing guidelines and our immunocompromised population.

## 2022-09-20 ENCOUNTER — Ambulatory Visit (HOSPITAL_COMMUNITY)
Admission: RE | Admit: 2022-09-20 | Discharge: 2022-09-20 | Disposition: A | Discharge status: Home / Self Care | Payer: Medicare Other | Source: Ambulatory Visit | Attending: Hematology | Admitting: Hematology

## 2022-09-20 DIAGNOSIS — I700 Atherosclerosis of aorta: Secondary | ICD-10-CM | POA: Diagnosis not present

## 2022-09-20 DIAGNOSIS — R918 Other nonspecific abnormal finding of lung field: Secondary | ICD-10-CM | POA: Insufficient documentation

## 2022-09-20 DIAGNOSIS — R590 Localized enlarged lymph nodes: Secondary | ICD-10-CM | POA: Insufficient documentation

## 2022-09-20 DIAGNOSIS — J439 Emphysema, unspecified: Secondary | ICD-10-CM | POA: Insufficient documentation

## 2022-09-20 DIAGNOSIS — N400 Enlarged prostate without lower urinary tract symptoms: Secondary | ICD-10-CM | POA: Insufficient documentation

## 2022-09-20 DIAGNOSIS — K118 Other diseases of salivary glands: Secondary | ICD-10-CM | POA: Diagnosis not present

## 2022-09-20 DIAGNOSIS — C3491 Malignant neoplasm of unspecified part of right bronchus or lung: Secondary | ICD-10-CM | POA: Insufficient documentation

## 2022-09-20 DIAGNOSIS — R911 Solitary pulmonary nodule: Secondary | ICD-10-CM | POA: Diagnosis not present

## 2022-09-20 MED ORDER — FLUDEOXYGLUCOSE F - 18 (FDG) INJECTION
9.3700 | Freq: Once | INTRAVENOUS | Status: AC | PRN
  Administered 2022-09-20: 9.37 via INTRAVENOUS

## 2022-09-25 NOTE — Progress Notes (Signed)
[facility] [address] [place] [address]    Clinic Day:  09/26/2022  Referring physician: Benita Stabile, MD  Patient Care Team: Benita Stabile, MD as PCP - General (Internal Medicine) Doreatha Massed, MD as Medical Oncologist (Medical Oncology)   ASSESSMENT & PLAN:   Assessment: 1. Stage IIIa (T1 aN2 M0) squamous cell carcinoma of right lung: - Diagnosed in #2013, status post concurrent chemoradiation therapy with carboplatin and paclitaxel weekly, treated in [place], [place]. - He is being followed by Dr. Arbutus Ped in [place] once a year with scans. - Last CT chest with contrast on 02/20/2021 did not show any evidence of recurrence.  2. Social/family history: - He is recently widowed and lives by himself.  He is retired from [employer] and also worked as a Audiological scientist at [employer]. - Current active smoker, smokes 2 to 3 packs/day for the last 74 years. - Mother had "cancer in her abdomen".  Father and 4 paternal uncles had prostate cancer.  3.  Prostate cancer: - Diagnosed in 2013 and had seed implant and "Calypso treatment" with 5 treatments of radiation.  He reports that his most recent PSA was undetectable.    Plan: 1.  Stage IIIa (T1 N2 M0) right lung squamous cell carcinoma: - CT chest (09/04/2022): Enlarged right hilar lymph node measuring 14 mm, previously 12 mm.  Irregular solid left upper lobe lung nodule increased in size.  New part solid nodule of the right upper lobe measuring 11 x 8 mm with 4 mm solid component. - PET scan (09/20/2022): Left upper lobe 1.2 cm nodule is hypermetabolic SUV 2.6.  Mixed attenuation posterior right upper lobe nodule is mildly hypermetabolic at 11 mm, SUV 1.8.  Enlarging right hilar lymph node 1.4 cm with SUV 3.5. - Findings are highly concerning for recurrence/new primary. - Recommend bronchoscopy and biopsy.  Will make referral to pulmonary. - RTC 1 week after biopsy.    2.   Prostate cancer: - I have recommended genetics evaluation because of personal and family history.    No orders of the defined types were placed in this encounter.     I,Katie Daubenspeck,acting as a Neurosurgeon for Doreatha Massed, MD.,have documented all relevant documentation on the behalf of Doreatha Massed, MD,as directed by  Doreatha Massed, MD while in the presence of Doreatha Massed, MD.   I, Doreatha Massed MD, have reviewed the above documentation for accuracy and completeness, and I agree with the above.   Doreatha Massed, MD   7/3/20246:38 PM  CHIEF COMPLAINT:   Diagnosis: right lung squamous cell carcinoma    Cancer Staging  Squamous cell carcinoma of right lung (HCC) Staging form: Lung, AJCC 7th Edition - Clinical: Stage Unknown (TX, N2, M0) - Signed by Ellouise Newer, PA-C on 07/15/2015    Prior Therapy: chemoradiation therapy   Current Therapy:  surveillance    HISTORY OF PRESENT ILLNESS:   Oncology History Overview Note  H/O Stage IIIA poorly differentiated squamous cell carcinoma of right lung (TXN2M0) treated with concurrent chemoradiation consisting of weekly Carbo/Taxo (02/28/2012- 04/17/2012) complicated by hospitalizations (2) and issues with thrombocytopenia resulting in completing 6/8 cycles of chemotherapy.  He underwent XRT from 02/25/2012- 04/29/2012.   Squamous cell carcinoma of right lung (HCC)  12/03/2011 PET scan   Persistent focal uptake in the rectoanal bowel, benign inflammatory possible neoplastic process cannot be excluded. Multiple enlarging hypermetabolic mediastinal lymph nodes concerning for malignancy. RML density favoring benign  etiology   12/13/2011 Procedure   Colonoscopy found tubular adenomas at 75 cm and 40 cm   01/14/2012 Imaging   CT chest-no significant interval change in mediastinal lymphadenopathy when compared to 11/2011.  Spiculated opacity in the right upper lobe not significantly changed from 11/2011.  Stable RML noncalcified on her nodules. Diffuse emphysema.   02/08/2012 Procedure   Bronchoscopy revealing chronic bronchitis, multiple mucous plugs are present and lavaged out from the lung. Needle aspiration of mediastinal lymph node performed.   02/08/2012 Pathology Results   Needle aspiration of mediastinal lymph node-Poorly differentiated squamous cell carcinoma   02/19/2012 Imaging   MRI brain-negative   02/19/2012 Cancer Staging   Clinically Stage IIIA   02/25/2012 - 04/29/2012 Radiation Therapy   Dr. Lissa Merlin   02/28/2012 - 04/17/2012 Chemotherapy   Weekly Carbo/Taxol requiring dose reduction due to thrombocytopenia/hospitalization.   03/27/2012 Treatment Plan Change   Treatment held due to thrombocytopenia/hospitalization   04/10/2012 Treatment Plan Change   Treatment held due to thrombocytopenia/hospitalization   04/17/2012 Treatment Plan Change   Patient refused any further chemotherapy.   05/19/2015 Imaging   CT chest- Mild interval increase in the prominence of chronic irregular densities in the anterior right mid lung. Most compatible with areas of scarring and atelectasis. Recommend CT follow-up in 3-6 months. Mildly prominent right hilar lymph nodes.   08/17/2015 Imaging   CT chest- Areas of architectural distortion, bronchiectasis + consolidation in the RUL + RML are presumably treatment related. Given absence of a recent prior comparison examination, it is difficult to definitively exclude residual disease.      INTERVAL HISTORY:   Eric Hinton is a 82 y.o. male presenting to clinic today for follow up of right lung squamous cell carcinoma. He was last seen by me on 09/18/22.  Since his last visit, he underwent restaging chest CT on 09/20/22 showing: mild hypermetabolism to the right hilar/infrahilar node; hypermetabolic LUL pulmonary nodule; low-level activity to posterolateral RUL nodule, indeterminate; no tracer-avid distant metastasis.  Today, he states that he is doing well  overall. His appetite level is at 100%. His energy level is at 80%.  PAST MEDICAL HISTORY:   Past Medical History: Past Medical History:  Diagnosis Date   Arthritis    Carotid artery disease (HCC)    Status post bilateral CEA - Dr. Arbie Cookey   Constipation    COPD (chronic obstructive pulmonary disease) (HCC)    DDD (degenerative disc disease), cervical    Degenerative disc disease, lumbar    Essential hypertension    Hyperlipidemia    Macular degeneration    Prostate cancer (HCC)    Skin cancer, basal cell    Squamous cell lung cancer (HCC) 07/15/2015   Status post chemotherapy and XRT - Dr. Arbutus Ped   Tubular adenoma of colon     Surgical History: Past Surgical History:  Procedure Laterality Date   CATARACT EXTRACTION W/ INTRAOCULAR LENS  IMPLANT, BILATERAL Bilateral    COLONOSCOPY     COLONOSCOPY WITH PROPOFOL N/A 05/17/2017   Procedure: COLONOSCOPY WITH PROPOFOL;  Surgeon: Malissa Hippo, MD;  [facility]: [facility];  Service: Endoscopy;  Laterality: N/A;  10:15   ENDARTERECTOMY Right 08/29/2015   Procedure: RIGHT CAROTID ENDARTERECTOMY WITH PATCH ANGIOPLASTY;  Surgeon: Larina Earthly, MD;  Location: [facility] OR;  Service: Vascular;  Laterality: Right;   ENDARTERECTOMY Left 10/07/2015   Procedure: LEFT CAROTID ARTERY ENDARTERECTOMY;  Surgeon: Larina Earthly, MD;  Location: [facility] OR;  Service: Vascular;  Laterality: Left;  NOSE SURGERY     "rebulit my nose; related to skin cancer"   PATCH ANGIOPLASTY Left 10/07/2015   Procedure: With HEMASHIELD PLATINUM PATCH ANGIOPLASTY;  Surgeon: Larina Earthly, MD;  Location: [facility] OR;  Service: Vascular;  Laterality: Left;   POLYPECTOMY  05/17/2017   Procedure: POLYPECTOMY;  Surgeon: Malissa Hippo, MD;  Location: [facility];  Service: Endoscopy;;  colon    PROSTATE BIOPSY     SKIN CANCER EXCISION Right    neck   TONSILLECTOMY      Social History: Social History   Socioeconomic History   Marital status: Married    Spouse name: Not on file    Number of children: Not on file   Years of education: Not on file   Highest education level: Not on file  Occupational History   Not on file  Tobacco Use   Smoking status: Every Day    Packs/day: 2.00    Years: 69.00    Additional pack years: 0.00    Total pack years: 138.00    Types: Cigarettes    Start date: [birth year]/1946   Smokeless tobacco: Never  Vaping Use   Vaping Use: Never used  Substance and Sexual Activity   Alcohol use: Yes    Alcohol/week: 35.0 standard drinks of alcohol    Types: 35 Cans of beer per week    Comment: 10/07/2015 "4-6 beers/day"   Drug use: No   Sexual activity: Not on file  Other Topics Concern   Not on file  Social History Narrative   Not on file   Social Determinants of Health   Financial Resource Strain: Not on file  Food Insecurity: Not on file  Transportation Needs: Not on file  Physical Activity: Not on file  Stress: Not on file  Social Connections: Not on file  Intimate Partner Violence: Not on file    Family History: Family History  Problem Relation Age of Onset   Cancer Mother    Stroke Father    Heart disease Father    Diabetes Mellitus II Sister     Current Medications:  Current Outpatient Medications:    aspirin 325 MG tablet, Take 1 tablet (325 mg total) by mouth daily., Disp: , Rfl:    atorvastatin (LIPITOR) 10 MG tablet, Take 10 mg by mouth daily., Disp: , Rfl:    bevacizumab (AVASTIN) 400 MG/16ML SOLN, 1.25 mg every 8 (eight) weeks. Left eye, Disp: , Rfl:    Cyanocobalamin (VITAMIN B-12) 2500 MCG SUBL, Place 2,500 mcg under the tongue daily., Disp: , Rfl:    FLUoxetine (PROZAC) 20 MG capsule, Take 20 mg by mouth daily., Disp: , Rfl:    latanoprost (XALATAN) 0.005 % ophthalmic solution, Place 1 drop into both eyes at bedtime., Disp: , Rfl:    menthol-zinc oxide (GOLD BOND) powder, Apply 1 application topically daily as needed (for itching)., Disp: , Rfl:    Multiple Vitamins-Minerals (PRESERVISION AREDS 2) CAPS, Take 1  capsule by mouth 2 (two) times daily., Disp: , Rfl:    naproxen sodium (ANAPROX) 220 MG tablet, Take 440 mg by mouth 2 (two) times daily as needed (for pain)., Disp: , Rfl:    tamsulosin (FLOMAX) 0.4 MG CAPS capsule, TAKE 1 CAPSULE BY MOUTH DAILY, Disp: 30 capsule, Rfl: 0   telmisartan (MICARDIS) 80 MG tablet, Take 80 mg by mouth daily., Disp: , Rfl:    Allergies: Allergies  Allergen Reactions   Bupropion Hives    REVIEW OF SYSTEMS:  Review of Systems  Constitutional:  Negative for chills, fatigue and fever.  HENT:   Negative for lump/mass, mouth sores, nosebleeds, sore throat and trouble swallowing.   Eyes:  Negative for eye problems.  Respiratory:  Positive for shortness of breath. Negative for cough.   Cardiovascular:  Negative for chest pain, leg swelling and palpitations.  Gastrointestinal:  Negative for abdominal pain, constipation, diarrhea, nausea and vomiting.  Genitourinary:  Negative for bladder incontinence, difficulty urinating, dysuria, frequency, hematuria and nocturia.   Musculoskeletal:  Negative for arthralgias, back pain, flank pain, myalgias and neck pain.  Skin:  Negative for itching and rash.  Neurological:  Positive for numbness. Negative for dizziness and headaches.  Hematological:  Does not bruise/bleed easily.  Psychiatric/Behavioral:  Negative for depression, sleep disturbance and suicidal ideas. The patient is not nervous/anxious.   All other systems reviewed and are negative.    VITALS:   Blood pressure 126/79, pulse 97, temperature 97.8 F (36.6 C), temperature source Oral, resp. rate 16, SpO2 96 %.  Wt Readings from Last 3 Encounters:  09/18/22 173 lb 9.6 oz (78.7 kg)  02/27/22 164 lb (74.4 kg)  09/20/21 167 lb 6.4 oz (75.9 kg)    There is no height or weight on file to calculate BMI.  Performance status (ECOG): 1 - Symptomatic but completely ambulatory  PHYSICAL EXAM:   Physical Exam Vitals and nursing note reviewed. Exam conducted with a  chaperone present.  Constitutional:      Appearance: Normal appearance.  Cardiovascular:     Rate and Rhythm: Normal rate and regular rhythm.     Pulses: Normal pulses.     Heart sounds: Normal heart sounds.  Pulmonary:     Effort: Pulmonary effort is normal.     Breath sounds: Normal breath sounds.  Abdominal:     Palpations: Abdomen is soft. There is no hepatomegaly, splenomegaly or mass.     Tenderness: There is no abdominal tenderness.  Musculoskeletal:     Right lower leg: No edema.     Left lower leg: No edema.  Lymphadenopathy:     Cervical: No cervical adenopathy.     Right cervical: No superficial, deep or posterior cervical adenopathy.    Left cervical: No superficial, deep or posterior cervical adenopathy.     Upper Body:     Right upper body: No supraclavicular or axillary adenopathy.     Left upper body: No supraclavicular or axillary adenopathy.  Neurological:     General: No focal deficit present.     Mental Status: He is alert and oriented to person, place, and time.  Psychiatric:        Mood and Affect: Mood normal.        Behavior: Behavior normal.     LABS:      Latest Ref Rng & Units 09/04/2022   12:07 PM 02/20/2022   12:22 PM 02/20/2021   10:58 AM  CBC  WBC 4.0 - 10.5 K/uL 7.6  4.7  5.0   Hemoglobin 13.0 - 17.0 g/dL 16.1  09.6  04.5   Hematocrit 39.0 - 52.0 % 40.1  41.8  46.0   Platelets 150 - 400 K/uL 237  174  173       Latest Ref Rng & Units 09/04/2022   12:07 PM 02/20/2022   12:22 PM 02/20/2021   11:23 AM  CMP  Glucose 70 - 99 mg/dL 409  811  914   BUN 8 - 23 mg/dL 17  15  17  Creatinine 0.61 - 1.24 mg/dL 1.61  0.96  0.45   Sodium 135 - 145 mmol/L 137  139  136   Potassium 3.5 - 5.1 mmol/L 3.8  3.8  4.0   Chloride 98 - 111 mmol/L 102  105  103   CO2 22 - 32 mmol/L 26  27  24    Calcium 8.9 - 10.3 mg/dL 9.1  9.1  9.3   Total Protein 6.5 - 8.1 g/dL 6.7  6.6  7.2   Total Bilirubin 0.3 - 1.2 mg/dL 0.8  0.8  0.4   Alkaline Phos 38 - 126  U/L 65  58  63   AST 15 - 41 U/L 28  17  16    ALT 0 - 44 U/L 57  19  19      No results found for: "CEA1", "CEA" / No results found for: "CEA1", "CEA" No results found for: "PSA1" No results found for: "WUJ811" No results found for: "CAN125"  No results found for: "TOTALPROTELP", "ALBUMINELP", "A1GS", "A2GS", "BETS", "BETA2SER", "GAMS", "MSPIKE", "SPEI" No results found for: "TIBC", "FERRITIN", "IRONPCTSAT" No results found for: "LDH"   STUDIES:   NM PET Image Restag (PS) Skull Base To Thigh  Result Date: 09/25/2022 CLINICAL DATA:  Initial treatment strategy for squamous cell carcinoma of right lung. EXAM: NUCLEAR MEDICINE PET SKULL BASE TO THIGH TECHNIQUE: 9.4 mCi F-18 FDG was injected intravenously. Full-ring PET imaging was performed from the skull base to thigh after the radiotracer. CT data was obtained and used for attenuation correction and anatomic localization. Fasting blood glucose: 105 mg/dl COMPARISON:  Multiple chest CTs, most recent 09/04/2022. FINDINGS: Mediastinal blood pool activity: SUV max 3.0 Liver activity: SUV max Not applicable. NECK: Deep left parotid hypermetabolic nodule measures 4 mm and a S.U.V. max of 5.0 on 37/3. No cervical nodal hypermetabolism. Incidental CT findings: No cervical adenopathy. CHEST: The left upper lobe 1.2 cm nodule is hypermetabolic at a S.U.V. max of 2.6 on 78/3. Low-level activity within the anteromedial right upper lobe radiation induced consolidation, without local recurrence. The mixed attenuation posterior right upper lobe nodule on prior CT is mildly hypermetabolic at 11 mm and a S.U.V. max of 1.8 on 101/3. The enlarging right hilar/infrahilar node is hypermetabolic at 1.4 cm and a S.U.V. max of 3.5 on 115/3. Incidental CT findings: Deferred to recent diagnostic CT. Aortic and coronary artery calcification. Centrilobular emphysema. ABDOMEN/PELVIS: No abdominopelvic parenchymal or nodal hypermetabolism. Incidental CT findings: Normal adrenal  glands. Left renal vascular calcification with probable interpolar collecting system punctate calculus. Left renal macrolobulated cyst versus adjacent cysts including at up to 7.5 cm. Present back to 2011 and does not warrant imaging follow-up. Hepatic cysts.  Mild prostatomegaly. SKELETON: No abnormal marrow activity. Incidental CT findings: None. IMPRESSION: 1. The right hilar/infrahilar node is mildly hypermetabolic and suspicious for nodal metastasis, especially given interval enlargement over prior CTs. 2. Hypermetabolic left upper lobe pulmonary nodule, favoring metachronous primary bronchogenic carcinoma. 3. Low-level activity corresponding to a posterolateral right upper lobe mixed attenuation nodule. Indeterminate but suspicious for metachronous primary. 4. No tracer avid distant metastasis. 5. Deep left parotid hypermetabolic 4 mm nodule is favored to represent an incidental primary such as a Warthin's tumor. 6. Incidental findings, including: Aortic atherosclerosis (ICD10-I70.0), coronary artery atherosclerosis and emphysema (ICD10-J43.9). Prostatomegaly. Probable left nephrolithiasis. Electronically Signed   By: Jeronimo Greaves M.D.   On: 09/25/2022 10:28   CT Chest W Contrast  Result Date: 09/10/2022 CLINICAL DATA:  Lung cancer follow-up; * Tracking Code:  BO * EXAM: CT CHEST WITH CONTRAST TECHNIQUE: Multidetector CT imaging of the chest was performed during intravenous contrast administration. RADIATION DOSE REDUCTION: This exam was performed according to the departmental dose-optimization program which includes automated exposure control, adjustment of the mA and/or kV according to patient size and/or use of iterative reconstruction technique. CONTRAST:  75mL OMNIPAQUE IOHEXOL 300 MG/ML  SOLN COMPARISON:  Multiple priors, most recent chest CT dated February 20, 2022 FINDINGS: Cardiovascular: Normal heart size. No pericardial effusion. Severe aortic valve calcifications, unchanged when compared with  the prior. Normal caliber thoracic aorta with severe atherosclerotic disease. Mitral annular calcifications. Moderate coronary artery calcifications. Mediastinum/Nodes: Esophagus and thyroid are unremarkable. Enlarged right hilar lymph node measuring 14 mm on series 2, image 91, previously 12 mm. No enlarged mediastinal or left hilar lymph nodes. Lungs/Pleura: Central airways are patent. Stable right upper lobe and right middle lobe posttreatment changes interval increased size of irregular left upper lobe pulmonary nodule, previously two adjacent small nodules, measures 11 x 7 mm on series 5 image 32, previously nodules measured 6 and 4 mm. New part solid nodule of the right upper lobe measuring 11 x 8 mm with 4 mm solid component. No pleural effusion. Upper Abdomen: Low-attenuation liver lesions are unchanged when compared with the prior exam and likely simple cysts. No acute abnormality. Musculoskeletal: No aggressive appearing osseous lesions. IMPRESSION: 1. Enlarged right hilar lymph node is slightly increased in size and concerning for metastatic disease. Consider PET-CT for further evaluation. 2. Irregular solid left upper lobe pulmonary nodule is increased in size and concerning for synchronous primary malignancy. Could also be further evaluated with PET-CT. 3. New part solid nodule of the right upper lobe. Recommend attention on follow-up. 4. Aortic valve calcifications, coronary artery calcifications, and aortic atherosclerosis (ICD10-I70.0). Electronically Signed   By: Allegra Lai M.D.   On: 09/10/2022 21:47

## 2022-09-26 ENCOUNTER — Inpatient Hospital Stay: Payer: Medicare Other | Attending: Hematology | Admitting: Hematology

## 2022-09-26 VITALS — BP 126/79 | HR 97 | Temp 97.8°F | Resp 16

## 2022-09-26 DIAGNOSIS — Z85118 Personal history of other malignant neoplasm of bronchus and lung: Secondary | ICD-10-CM | POA: Diagnosis not present

## 2022-09-26 DIAGNOSIS — Z8546 Personal history of malignant neoplasm of prostate: Secondary | ICD-10-CM | POA: Diagnosis not present

## 2022-09-26 DIAGNOSIS — Z08 Encounter for follow-up examination after completed treatment for malignant neoplasm: Secondary | ICD-10-CM | POA: Insufficient documentation

## 2022-09-26 DIAGNOSIS — C3491 Malignant neoplasm of unspecified part of right bronchus or lung: Secondary | ICD-10-CM

## 2022-09-26 NOTE — Patient Instructions (Addendum)
[facility] at [facility] Discharge Instructions   You were seen and examined today by Dr. Ellin Saba.  He reviewed the results of your PET scan. There are spots lighting up on the left lung. There is also a lymph node in the right lung that is mildly lighting up. There were no other spots in your body that are suspicious for cancer.   We will need to arrange for you to have a biopsy of these areas in the lung. This will need to be done by a lung doctor. They are in [place]. We will make a referral to them for you.    Thank you for choosing [facility] at [facility] to provide your oncology and hematology care.  To afford each patient quality time with our provider, please arrive at least 15 minutes before your scheduled appointment time.   If you have a lab appointment with the Cancer Center please come in thru the Main Entrance and check in at the main information desk.  You need to re-schedule your appointment should you arrive 10 or more minutes late.  We strive to give you quality time with our providers, and arriving late affects you and other patients whose appointments are after yours.  Also, if you no show three or more times for appointments you may be dismissed from the clinic at the providers discretion.     Again, thank you for choosing [facility].  Our hope is that these requests will decrease the amount of time that you wait before being seen by our physicians.       _____________________________________________________________  Should you have questions after your visit to [facility], please contact our office at [phone] and follow the prompts.  Our office hours are 8:00 a.m. and 4:30 p.m. Monday - Friday.  Please note that voicemails left after 4:00 p.m. may not be returned until the following business day.  We are closed weekends and major holidays.  You do have access to a nurse 24-7, just call  the main number to the clinic [phone] and do not press any options, hold on the line and a nurse will answer the phone.    For prescription refill requests, have your pharmacy contact our office and allow 72 hours.    Due to Covid, you will need to wear a mask upon entering the hospital. If you do not have a mask, a mask will be given to you at the Main Entrance upon arrival. For doctor visits, patients may have 1 support person age 82 or older with them. For treatment visits, patients can not have anyone with them due to social distancing guidelines and our immunocompromised population.

## 2022-10-04 ENCOUNTER — Inpatient Hospital Stay (HOSPITAL_COMMUNITY): Admission: RE | Admit: 2022-10-04 | Payer: Medicare Other | Source: Ambulatory Visit

## 2022-10-09 ENCOUNTER — Inpatient Hospital Stay: Payer: Medicare Other | Admitting: Hematology

## 2022-10-16 DIAGNOSIS — E782 Mixed hyperlipidemia: Secondary | ICD-10-CM | POA: Diagnosis not present

## 2022-10-16 DIAGNOSIS — J449 Chronic obstructive pulmonary disease, unspecified: Secondary | ICD-10-CM | POA: Diagnosis not present

## 2022-10-16 DIAGNOSIS — I10 Essential (primary) hypertension: Secondary | ICD-10-CM | POA: Diagnosis not present

## 2022-10-16 DIAGNOSIS — G44009 Cluster headache syndrome, unspecified, not intractable: Secondary | ICD-10-CM | POA: Diagnosis not present

## 2022-10-16 DIAGNOSIS — I998 Other disorder of circulatory system: Secondary | ICD-10-CM | POA: Diagnosis not present

## 2022-10-16 DIAGNOSIS — Z85118 Personal history of other malignant neoplasm of bronchus and lung: Secondary | ICD-10-CM | POA: Diagnosis not present

## 2022-10-16 DIAGNOSIS — Z0000 Encounter for general adult medical examination without abnormal findings: Secondary | ICD-10-CM | POA: Diagnosis not present

## 2022-10-16 DIAGNOSIS — I2510 Atherosclerotic heart disease of native coronary artery without angina pectoris: Secondary | ICD-10-CM | POA: Diagnosis not present

## 2022-10-16 DIAGNOSIS — H409 Unspecified glaucoma: Secondary | ICD-10-CM | POA: Diagnosis not present

## 2022-10-16 DIAGNOSIS — K5909 Other constipation: Secondary | ICD-10-CM | POA: Diagnosis not present

## 2022-10-16 DIAGNOSIS — N400 Enlarged prostate without lower urinary tract symptoms: Secondary | ICD-10-CM | POA: Diagnosis not present

## 2022-10-16 DIAGNOSIS — I739 Peripheral vascular disease, unspecified: Secondary | ICD-10-CM | POA: Diagnosis not present

## 2022-10-25 ENCOUNTER — Ambulatory Visit: Payer: Medicare Other | Admitting: Hematology

## 2022-10-29 ENCOUNTER — Encounter: Payer: Self-pay | Admitting: Dermatology

## 2022-10-30 ENCOUNTER — Inpatient Hospital Stay: Payer: Medicare Other | Admitting: Hematology

## 2022-10-30 ENCOUNTER — Telehealth: Payer: Self-pay

## 2022-10-30 NOTE — Telephone Encounter (Signed)
8/6 @ 3:14 pm Patient left voicemail for return call.  Patient wanted to know if we provide MR-LINAC therapy treatments here at our facility.  Reach out to [employer] (Support [employer]), she stated that we don't do these treatments due to it would require MRI guidance and we use CT guidance for treatments.  Patient is aware and was grateful for call back.

## 2022-11-01 ENCOUNTER — Inpatient Hospital Stay: Payer: Medicare Other | Admitting: Licensed Clinical Social Worker

## 2022-11-13 ENCOUNTER — Ambulatory Visit (INDEPENDENT_AMBULATORY_CARE_PROVIDER_SITE_OTHER): Payer: Medicare Other | Admitting: Emergency Medicine

## 2022-11-13 ENCOUNTER — Encounter: Payer: Self-pay | Admitting: Emergency Medicine

## 2022-11-13 VITALS — BP 140/70 | HR 82 | Ht 63.0 in | Wt 173.6 lb

## 2022-11-13 DIAGNOSIS — R918 Other nonspecific abnormal finding of lung field: Secondary | ICD-10-CM | POA: Diagnosis not present

## 2022-11-13 NOTE — H&P (View-Only) (Signed)
Subjective:    Patient ID: Eric Hinton, male    DOB: 06-Jul-1940, 82 y.o.   MRN: [id number]  HPI 82 year old active smoker (150 pack years) with a history of prostate cancer (2013, [facility]), stage IIIa squamous cell lung cancer (2013, chemoradiation, [place]).  Has been followed by Dr. Arbutus Ped and now by Dr. Ellin Saba for surveillance.  PMH also significant for CAD, hypertension, COPD not currently on inhaled medication.  He had surveillance CT chest imaging 09/04/2022 and then PET scan on 09/20/2022 as below Today he reports that he has some exertional SOB, no cough. No BD's but he was just seen at the [place] and it sounds like an inhaler was sent in for him > sounds like albuterol.   CT scan of the chest 09/04/2022 reviewed by me, shows enlarged right hilar node 14 mm (from 12 mm), stable right upper lobe and right middle lobe posttreatment changes.  Enlargement of left upper lobe pulmonary nodules, now 11 x 7 mm (from 6 mm), a new part solid right upper lobe nodule 11 mm with a 4 mm solid component.  PET scan 09/20/2022 reviewed by me shows some mild hypermetabolic activity in the 11 mm right upper lobe nodule, hypermetabolism noted in a 1.2 cm left upper lobe nodule, hypermetabolism in the enlarging right hilar lymphadenopathy.     Review of Systems As per HPI  Past Medical History:  Diagnosis Date   Arthritis    Carotid artery disease (HCC)    Status post bilateral CEA - Dr. Arbie Cookey   Constipation    COPD (chronic obstructive pulmonary disease) (HCC)    DDD (degenerative disc disease), cervical    Degenerative disc disease, lumbar    Essential hypertension    Hyperlipidemia    Macular degeneration    Prostate cancer (HCC)    Skin cancer, basal cell    Squamous cell lung cancer (HCC) 07/15/2015   Status post chemotherapy and XRT - Dr. Arbutus Ped   Tubular adenoma of colon      Family History  Problem Relation Age of Onset   Cancer Mother    Stroke Father    Heart disease Father     Diabetes Mellitus II Sister      Social History   Socioeconomic History   Marital status: Married    Spouse name: Not on file   Number of children: Not on file   Years of education: Not on file   Highest education level: Not on file  Occupational History   Not on file  Tobacco Use   Smoking status: Every Day    Current packs/day: 2.00    Average packs/day: 2.0 packs/day for 76.6 years (153.2 ttl pk-yrs)    Types: Cigarettes    Start date: 04/03/1946   Smokeless tobacco: Never  Vaping Use   Vaping status: Never Used  Substance and Sexual Activity   Alcohol use: Yes    Alcohol/week: 35.0 standard drinks of alcohol    Types: 35 Cans of beer per week    Comment: 10/07/2015 "4-6 beers/day"   Drug use: No   Sexual activity: Not on file  Other Topics Concern   Not on file  Social History Narrative   Not on file   Social Determinants of Health   Financial Resource Strain: Not on file  Food Insecurity: Not on file  Transportation Needs: Not on file  Physical Activity: Not on file  Stress: Not on file  Social Connections: Not on file  Intimate Partner Violence:  Not on file    Was in the [employer], 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    Allergies  Allergen Reactions   Bupropion Hives     Outpatient Medications Prior to Visit  Medication Sig Dispense Refill   aspirin 325 MG tablet Take 1 tablet (325 mg total) by mouth daily.     atorvastatin (LIPITOR) 10 MG tablet Take 10 mg by mouth daily.     bevacizumab (AVASTIN) 400 MG/16ML SOLN 1.25 mg every 8 (eight) weeks. Left eye     Cyanocobalamin (VITAMIN B-12) 2500 MCG SUBL Place 2,500 mcg under the tongue daily.     FLUoxetine (PROZAC) 20 MG capsule Take 20 mg by mouth daily.     latanoprost (XALATAN) 0.005 % ophthalmic solution Place 1 drop into both eyes at bedtime.     menthol-zinc oxide (GOLD BOND) powder Apply 1 application topically daily as  needed (for itching).     Multiple Vitamins-Minerals (PRESERVISION AREDS 2) CAPS Take 1 capsule by mouth 2 (two) times daily.     naproxen sodium (ANAPROX) 220 MG tablet Take 440 mg by mouth 2 (two) times daily as needed (for pain).     tamsulosin (FLOMAX) 0.4 MG CAPS capsule TAKE 1 CAPSULE BY MOUTH DAILY 30 capsule 0   telmisartan (MICARDIS) 80 MG tablet Take 80 mg by mouth daily.     No facility-administered medications prior to visit.        Objective:   Physical Exam Vitals:   11/13/22 1031  BP: (!) 140/70  Pulse: 82  SpO2: 98%  Weight: 173 lb 9.6 oz (78.7 kg)  Height: 5\' 3"  (1.6 m)   Gen: Pleasant, well-nourished, in no distress,  normal affect  ENT: No lesions,  mouth clear,  oropharynx clear, no postnasal drip  Neck: No JVD, no stridor  Lungs: No use of accessory muscles, no crackles or wheezing on normal respiration, no wheeze on forced expiration  Cardiovascular: RRR, heart sounds normal, no murmur or gallops, no peripheral edema  Musculoskeletal: No deformities, no cyanosis or clubbing  Neuro: alert, awake, non focal  Skin: Warm, no lesions or rash      Assessment & Plan:  Pulmonary nodules With mild hypermetabolism in appearance suspicious for recurrence of lung cancer.  He does still smoke.  Discussed with him the pros and cons of bronchoscopy and he does want to proceed.  He can do this on Tuesdays and we will try to get this set up on 12/11/2022  We reviewed your CT chest and your PET scan today. We will arrange for navigational bronchoscopy and endobronchial ultrasound to evaluate your pulmonary nodules and enlarged lymph nodes.  This will be done as an outpatient under general anesthesia at [facility] endoscopy.  You will need a designated driver and someone to watch you on that day after the procedure.  We will try to get this done on 12/11/2022.  You will need to stop your aspirin 2 days prior. Agree with starting albuterol.  You can use 2 puffs up to  every 4 hours if needed for shortness of breath, chest tightness, wheezing.  We may decide to perform pulmonary function testing at some point going forward. Try to work on decreasing your cigarettes if possible Follow with T. Parrett or Dr. Delton Coombes in 5 weeks, after your bronchoscopy   Levy Pupa, MD, PhD 11/13/2022, 11:11 AM [facility] [phone] or if no  answer before 7:00PM call [phone] For any issues after 7:00PM please call eLink [phone]

## 2022-11-13 NOTE — Patient Instructions (Addendum)
We reviewed your CT chest and your PET scan today. We will arrange for navigational bronchoscopy and endobronchial ultrasound to evaluate your pulmonary nodules and enlarged lymph nodes.  This will be done as an outpatient under general anesthesia at [facility] endoscopy.  You will need a designated driver and someone to watch you on that day after the procedure.  We will try to get this done on 12/11/2022.  You will need to stop your aspirin 2 days prior. Agree with starting albuterol.  You can use 2 puffs up to every 4 hours if needed for shortness of breath, chest tightness, wheezing.  We may decide to perform pulmonary function testing at some point going forward. Try to work on decreasing your cigarettes if possible Follow with T. Parrett or Dr. Delton Coombes in 5 weeks, after your bronchoscopy

## 2022-11-13 NOTE — Assessment & Plan Note (Signed)
With mild hypermetabolism in appearance suspicious for recurrence of lung cancer.  He does still smoke.  Discussed with him the pros and cons of bronchoscopy and he does want to proceed.  He can do this on Tuesdays and we will try to get this set up on 12/11/2022  We reviewed your CT chest and your PET scan today. We will arrange for navigational bronchoscopy and endobronchial ultrasound to evaluate your pulmonary nodules and enlarged lymph nodes.  This will be done as an outpatient under general anesthesia at [facility] endoscopy.  You will need a designated driver and someone to watch you on that day after the procedure.  We will try to get this done on 12/11/2022.  You will need to stop your aspirin 2 days prior. Agree with starting albuterol.  You can use 2 puffs up to every 4 hours if needed for shortness of breath, chest tightness, wheezing.  We may decide to perform pulmonary function testing at some point going forward. Try to work on decreasing your cigarettes if possible Follow with T. Parrett or Dr. Delton Coombes in 5 weeks, after your bronchoscopy

## 2022-11-13 NOTE — Progress Notes (Signed)
Subjective:    Patient ID: Eric Hinton, male    DOB: [birth year], 83 y.o.   MRN: [id number]  HPI 82 year old active smoker (150 pack years) with a history of prostate cancer (2013, [facility]), stage IIIa squamous cell lung cancer (2013, chemoradiation, [place]).  Has been followed by Dr. Arbutus Ped and now by Dr. Ellin Saba for surveillance.  PMH also significant for CAD, hypertension, COPD not currently on inhaled medication.  He had surveillance CT chest imaging 09/04/2022 and then PET scan on 09/20/2022 as below Today he reports that he has some exertional SOB, no cough. No BD's but he was just seen at the [place] and it sounds like an inhaler was sent in for him > sounds like albuterol.   CT scan of the chest 09/04/2022 reviewed by me, shows enlarged right hilar node 14 mm (from 12 mm), stable right upper lobe and right middle lobe posttreatment changes.  Enlargement of left upper lobe pulmonary nodules, now 11 x 7 mm (from 6 mm), a new part solid right upper lobe nodule 11 mm with a 4 mm solid component.  PET scan 09/20/2022 reviewed by me shows some mild hypermetabolic activity in the 11 mm right upper lobe nodule, hypermetabolism noted in a 1.2 cm left upper lobe nodule, hypermetabolism in the enlarging right hilar lymphadenopathy.     Review of Systems As per HPI  Past Medical History:  Diagnosis Date   Arthritis    Carotid artery disease (HCC)    Status post bilateral CEA - Dr. Arbie Cookey   Constipation    COPD (chronic obstructive pulmonary disease) (HCC)    DDD (degenerative disc disease), cervical    Degenerative disc disease, lumbar    Essential hypertension    Hyperlipidemia    Macular degeneration    Prostate cancer (HCC)    Skin cancer, basal cell    Squamous cell lung cancer (HCC) 07/15/2015   Status post chemotherapy and XRT - Dr. Arbutus Ped   Tubular adenoma of colon      Family History  Problem Relation Age of Onset   Cancer Mother    Stroke Father    Heart disease Father     Diabetes Mellitus II Sister      Social History   Socioeconomic History   Marital status: Married    Spouse name: Not on file   Number of children: Not on file   Years of education: Not on file   Highest education level: Not on file  Occupational History   Not on file  Tobacco Use   Smoking status: Every Day    Current packs/day: 2.00    Average packs/day: 2.0 packs/day for 76.6 years (153.2 ttl pk-yrs)    Types: Cigarettes    Start date: 04/03/1946   Smokeless tobacco: Never  Vaping Use   Vaping status: Never Used  Substance and Sexual Activity   Alcohol use: Yes    Alcohol/week: 35.0 standard drinks of alcohol    Types: 35 Cans of beer per week    Comment: 10/07/2015 "4-6 beers/day"   Drug use: No   Sexual activity: Not on file  Other Topics Concern   Not on file  Social History Narrative   Not on file   Social Determinants of Health   Financial Resource Strain: Not on file  Food Insecurity: Not on file  Transportation Needs: Not on file  Physical Activity: Not on file  Stress: Not on file  Social Connections: Not on file  Intimate Partner Violence:  Not on file    Was in the [employer], 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    Allergies  Allergen Reactions   Bupropion Hives     Outpatient Medications Prior to Visit  Medication Sig Dispense Refill   aspirin 325 MG tablet Take 1 tablet (325 mg total) by mouth daily.     atorvastatin (LIPITOR) 10 MG tablet Take 10 mg by mouth daily.     bevacizumab (AVASTIN) 400 MG/16ML SOLN 1.25 mg every 8 (eight) weeks. Left eye     Cyanocobalamin (VITAMIN B-12) 2500 MCG SUBL Place 2,500 mcg under the tongue daily.     FLUoxetine (PROZAC) 20 MG capsule Take 20 mg by mouth daily.     latanoprost (XALATAN) 0.005 % ophthalmic solution Place 1 drop into both eyes at bedtime.     menthol-zinc oxide (GOLD BOND) powder Apply 1 application topically daily as  needed (for itching).     Multiple Vitamins-Minerals (PRESERVISION AREDS 2) CAPS Take 1 capsule by mouth 2 (two) times daily.     naproxen sodium (ANAPROX) 220 MG tablet Take 440 mg by mouth 2 (two) times daily as needed (for pain).     tamsulosin (FLOMAX) 0.4 MG CAPS capsule TAKE 1 CAPSULE BY MOUTH DAILY 30 capsule 0   telmisartan (MICARDIS) 80 MG tablet Take 80 mg by mouth daily.     No facility-administered medications prior to visit.        Objective:   Physical Exam Vitals:   11/13/22 1031  BP: (!) 140/70  Pulse: 82  SpO2: 98%  Weight: 173 lb 9.6 oz (78.7 kg)  Height: 5\' 3"  (1.6 m)   Gen: Pleasant, well-nourished, in no distress,  normal affect  ENT: No lesions,  mouth clear,  oropharynx clear, no postnasal drip  Neck: No JVD, no stridor  Lungs: No use of accessory muscles, no crackles or wheezing on normal respiration, no wheeze on forced expiration  Cardiovascular: RRR, heart sounds normal, no murmur or gallops, no peripheral edema  Musculoskeletal: No deformities, no cyanosis or clubbing  Neuro: alert, awake, non focal  Skin: Warm, no lesions or rash      Assessment & Plan:  Pulmonary nodules With mild hypermetabolism in appearance suspicious for recurrence of lung cancer.  He does still smoke.  Discussed with him the pros and cons of bronchoscopy and he does want to proceed.  He can do this on Tuesdays and we will try to get this set up on 12/11/2022  We reviewed your CT chest and your PET scan today. We will arrange for navigational bronchoscopy and endobronchial ultrasound to evaluate your pulmonary nodules and enlarged lymph nodes.  This will be done as an outpatient under general anesthesia at [facility] endoscopy.  You will need a designated driver and someone to watch you on that day after the procedure.  We will try to get this done on 12/11/2022.  You will need to stop your aspirin 2 days prior. Agree with starting albuterol.  You can use 2 puffs up to  every 4 hours if needed for shortness of breath, chest tightness, wheezing.  We may decide to perform pulmonary function testing at some point going forward. Try to work on decreasing your cigarettes if possible Follow with T. Parrett or Dr. Delton Coombes in 5 weeks, after your bronchoscopy   Levy Pupa, MD, PhD 11/13/2022, 11:11 AM [facility] [phone] or if no  answer before 7:00PM call [phone] For any issues after 7:00PM please call eLink [phone]

## 2022-11-20 DIAGNOSIS — H353231 Exudative age-related macular degeneration, bilateral, with active choroidal neovascularization: Secondary | ICD-10-CM | POA: Diagnosis not present

## 2022-11-27 ENCOUNTER — Ambulatory Visit (HOSPITAL_COMMUNITY)
Admission: RE | Admit: 2022-11-27 | Discharge: 2022-11-27 | Disposition: A | Discharge status: Home / Self Care | Payer: Medicare Other | Source: Ambulatory Visit | Attending: Emergency Medicine | Admitting: Emergency Medicine

## 2022-11-27 DIAGNOSIS — I3139 Other pericardial effusion (noninflammatory): Secondary | ICD-10-CM | POA: Diagnosis not present

## 2022-11-27 DIAGNOSIS — C3491 Malignant neoplasm of unspecified part of right bronchus or lung: Secondary | ICD-10-CM | POA: Diagnosis not present

## 2022-11-27 DIAGNOSIS — J439 Emphysema, unspecified: Secondary | ICD-10-CM | POA: Diagnosis not present

## 2022-11-27 DIAGNOSIS — R918 Other nonspecific abnormal finding of lung field: Secondary | ICD-10-CM | POA: Insufficient documentation

## 2022-11-27 DIAGNOSIS — I700 Atherosclerosis of aorta: Secondary | ICD-10-CM | POA: Diagnosis not present

## 2022-11-28 ENCOUNTER — Ambulatory Visit (HOSPITAL_COMMUNITY): Payer: Medicare Other

## 2022-12-10 ENCOUNTER — Encounter (HOSPITAL_COMMUNITY): Payer: Self-pay | Admitting: Emergency Medicine

## 2022-12-10 NOTE — Progress Notes (Signed)
SDW call  Patient was given pre-op instructions over the phone. Patient verbalized understanding of instructions provided.     PCP - Dr. Nita Sells Cardiologist - denies Pulmonary:    PPM/ICD - denies Device Orders - na Rep Notified - na   Chest x-ray - n/a EKG -  DOS, 12/11/2022 Stress Test -04/10/2016 ECHO - 04/10/2016 Cardiac Cath -   Sleep Study/sleep apnea/CPAP: denies  Non-diabetic  Blood Thinner Instructions: denies Aspirin Instructions:states last dose 12/08/2022   ERAS Protcol - NPO   COVID TEST- n/a    Anesthesia review: Yes. HTN, CAD, high cholesterol   Patient denies shortness of breath, fever, cough and chest pain over the phone call  Your procedure is scheduled on Tuesday September 17,; 2024  Report to [facility] Main Entrance "A" at  0530  A.M., then check in with the Admitting office.  Call this number if you have problems the morning of surgery:  [phone]   If you have any questions prior to your surgery date call [phone]: Open Monday-Friday 8am-4pm If you experience any cold or flu symptoms such as cough, fever, chills, shortness of breath, etc. between now and your scheduled surgery, please notify us at the above number     Remember:  Do not eat or drink after midnight the night before your surgery  Take these medicines the morning of surgery with A SIP OF WATER:  Prozac, flomax, lipitor, eye drops  As of today, STOP taking any Aspirin (unless otherwise instructed by your surgeon) Aleve, Naproxen, Ibuprofen, Motrin, Advil, Goody's, BC's, all herbal medications, fish oil, and all vitamins.

## 2022-12-10 NOTE — Progress Notes (Signed)
Anesthesia Chart Review: Same day workup  82 year old male with pertinent history including active smoker (150 pack years) with associated COPD, prostate cancer, stage IIIa squamous cell lung cancer s/p chemoradiation in 2013 in [place], HTN, COPD, carotid stenosis s/p bilateral CEA in 2017 (last duplex 07/2021 showed bilateral 1 to 39% ICA stenosis).  Patient will need day of surgery labs and evaluation.  CT super D chest 11/27/2022: IMPRESSION: Essentially stable appearance of the lungs. Bandlike opacity in the right upper lobe centrally is stable as well as the adjacent right upper lobe areas of separate nodularity. The left upper lobe spiculated nodule is similar as well.   There is 5 mm central left upper lobe nodule which is now cavitary. Attention on follow-up.   Stable right-sided hilar lymph nodes.   Aortic Atherosclerosis (ICD10-I70.0) and Emphysema (ICD10-J43.9).  Carotid duplex 07/25/2021: Summary:  Right Carotid: Velocities in the right ICA are consistent with a 1-39% stenosis.  Left Carotid: Velocities in the left ICA are consistent with a 1-39% stenosis.  Vertebrals: Bilateral vertebral arteries demonstrate antegrade flow.  Subclavians: Normal flow hemodynamics were seen in bilateral subclavian arteries.   Nuclear stress 04/10/2016: The study is normal. There are no perfusion defects consistent with prior infarct or current ischemia. This is a low risk study. The left ventricular ejection fraction is normal (55-65%). There was no ST segment deviation noted during stress.  TTE 04/10/2016: - Left ventricle: The cavity size was normal. Wall thickness was    normal. Systolic function was normal. The estimated ejection    fraction was in the range of 55% to 60%. Wall motion was normal;    there were no regional wall motion abnormalities. Left    ventricular diastolic function parameters were normal.  - Aortic valve: Moderately calcified annulus. Trileaflet;    moderately  thickened leaflets. There was mild stenosis. Valve    area (VTI): 1.56 cm^2. Valve area (Vmax): 1.56 cm^2. Valve area    (Vmean): 1.31 cm^2.  - Mitral valve: Mildly calcified annulus. Mildly thickened leaflets    . Valve area by pressure half-time: 2.12 cm^2. Valve area by    continuity equation (using LVOT flow): 1.8 cm^2.  - Systemic veins: IVC is small suggesting low RA pressure and    hypovolemia.     Youssouf, [facility]/Anesthesiology Phone [phone] 12/10/2022 9:28 AM

## 2022-12-10 NOTE — Anesthesia Preprocedure Evaluation (Addendum)
Anesthesia Evaluation  Patient identified by MRN, date of birth, ID band Patient awake    Reviewed: Allergy & Precautions, H&P , NPO status , Patient's Chart, lab work & pertinent test results  Airway Mallampati: II  TM Distance: >3 FB Neck ROM: Full    Dental no notable dental hx.    Pulmonary COPD, Current Smoker SSC of right Lung   Pulmonary exam normal breath sounds clear to auscultation       Cardiovascular hypertension, Normal cardiovascular exam Rhythm:Regular Rate:Normal  Hx of carotid stenosis s/p bl CEA  Normal BiV function on TTE   Neuro/Psych negative neurological ROS  negative psych ROS   GI/Hepatic negative GI ROS, Neg liver ROS,,,  Endo/Other  negative endocrine ROS    Renal/GU negative Renal ROS  negative genitourinary   Musculoskeletal  (+) Arthritis ,    Abdominal   Peds negative pediatric ROS (+)  Hematology negative hematology ROS (+)   Anesthesia Other Findings   Reproductive/Obstetrics negative OB ROS                             Anesthesia Physical Anesthesia Plan  ASA: 3  Anesthesia Plan: General   Post-op Pain Management:    Induction: Intravenous  PONV Risk Score and Plan: Treatment may vary due to age or medical condition and Ondansetron  Airway Management Planned: Oral ETT  Additional Equipment:   Intra-op Plan:   Post-operative Plan: Extubation in OR  Informed Consent: I have reviewed the patients History and Physical, chart, labs and discussed the procedure including the risks, benefits and alternatives for the proposed anesthesia with the patient or authorized representative who has indicated his/her understanding and acceptance.     Dental advisory given  Plan Discussed with: Surgeon  Anesthesia Plan Comments: (PAT note by Antionette Poles, PA-C: 82 year old male with pertinent history including active smoker (150 pack years) with  associated COPD, prostate cancer, stage IIIa squamous cell lung cancer s/p chemoradiation in 2013 in [place], HTN, COPD, carotid stenosis s/p bilateral CEA in 2017 (last duplex 07/2021 showed bilateral 1 to 39% ICA stenosis).  Patient will need day of surgery labs and evaluation.  CT super D chest 11/27/2022: IMPRESSION: Essentially stable appearance of the lungs. Bandlike opacity in the right upper lobe centrally is stable as well as the adjacent right upper lobe areas of separate nodularity. The left upper lobe spiculated nodule is similar as well.  There is 5 mm central left upper lobe nodule which is now cavitary. Attention on follow-up.  Stable right-sided hilar lymph nodes.  Aortic Atherosclerosis (ICD10-I70.0) and Emphysema (ICD10-J43.9).  Carotid duplex 07/25/2021: Summary:  Right Carotid: Velocities in the right ICA are consistent with a 1-39% stenosis.  Left Carotid: Velocities in the left ICA are consistent with a 1-39% stenosis.  Vertebrals:Bilateral vertebral arteries demonstrate antegrade flow.  Subclavians: Normal flow hemodynamics were seen in bilateral subclavian arteries.   Nuclear stress 04/10/2016:  The study is normal. There are no perfusion defects consistent with prior infarct or current ischemia.  This is a low risk study.  The left ventricular ejection fraction is normal (55-65%).  There was no ST segment deviation noted during stress.  TTE 04/10/2016: - Left ventricle: The cavity size was normal. Wall thickness was  normal. Systolic function was normal. The estimated ejection  fraction was in the range of 55% to 60%. Wall motion was normal;  there were no regional wall motion abnormalities. Left  ventricular  diastolic function parameters were normal.  - Aortic valve: Moderately calcified annulus. Trileaflet;  moderately thickened leaflets. There was mild stenosis. Valve  area (VTI): 1.56 cm^2. Valve area (Vmax): 1.56 cm^2. Valve area   (Vmean): 1.31 cm^2.  - Mitral valve: Mildly calcified annulus. Mildly thickened leaflets  . Valve area by pressure half-time: 2.12 cm^2. Valve area by  continuity equation (using LVOT flow): 1.8 cm^2.  - Systemic veins: IVC is small suggesting low RA pressure and  hypovolemia.    )        Anesthesia Quick Evaluation

## 2022-12-11 ENCOUNTER — Ambulatory Visit (HOSPITAL_COMMUNITY): Payer: Medicare Other

## 2022-12-11 ENCOUNTER — Ambulatory Visit (HOSPITAL_COMMUNITY)
Admission: RE | Admit: 2022-12-11 | Discharge: 2022-12-11 | Disposition: A | Discharge status: Home / Self Care | Payer: Medicare Other | Attending: Emergency Medicine | Admitting: Emergency Medicine

## 2022-12-11 ENCOUNTER — Encounter (HOSPITAL_COMMUNITY)
Admission: RE | Disposition: A | Discharge status: Home / Self Care | Payer: Self-pay | Source: Home / Self Care | Attending: Emergency Medicine

## 2022-12-11 ENCOUNTER — Ambulatory Visit (HOSPITAL_COMMUNITY): Payer: Self-pay | Admitting: Physician Assistant

## 2022-12-11 ENCOUNTER — Ambulatory Visit (HOSPITAL_BASED_OUTPATIENT_CLINIC_OR_DEPARTMENT_OTHER): Payer: Medicare Other | Admitting: Physician Assistant

## 2022-12-11 ENCOUNTER — Encounter (HOSPITAL_COMMUNITY): Payer: Self-pay | Admitting: Emergency Medicine

## 2022-12-11 DIAGNOSIS — C3412 Malignant neoplasm of upper lobe, left bronchus or lung: Secondary | ICD-10-CM | POA: Diagnosis not present

## 2022-12-11 DIAGNOSIS — R911 Solitary pulmonary nodule: Secondary | ICD-10-CM | POA: Diagnosis not present

## 2022-12-11 DIAGNOSIS — R918 Other nonspecific abnormal finding of lung field: Secondary | ICD-10-CM | POA: Diagnosis not present

## 2022-12-11 DIAGNOSIS — Z48813 Encounter for surgical aftercare following surgery on the respiratory system: Secondary | ICD-10-CM | POA: Diagnosis not present

## 2022-12-11 DIAGNOSIS — F17200 Nicotine dependence, unspecified, uncomplicated: Secondary | ICD-10-CM | POA: Diagnosis not present

## 2022-12-11 DIAGNOSIS — J449 Chronic obstructive pulmonary disease, unspecified: Secondary | ICD-10-CM

## 2022-12-11 DIAGNOSIS — Z8546 Personal history of malignant neoplasm of prostate: Secondary | ICD-10-CM | POA: Diagnosis not present

## 2022-12-11 DIAGNOSIS — Z923 Personal history of irradiation: Secondary | ICD-10-CM | POA: Insufficient documentation

## 2022-12-11 DIAGNOSIS — Z85828 Personal history of other malignant neoplasm of skin: Secondary | ICD-10-CM | POA: Insufficient documentation

## 2022-12-11 DIAGNOSIS — F17210 Nicotine dependence, cigarettes, uncomplicated: Secondary | ICD-10-CM

## 2022-12-11 DIAGNOSIS — R590 Localized enlarged lymph nodes: Secondary | ICD-10-CM | POA: Diagnosis present

## 2022-12-11 DIAGNOSIS — Z9221 Personal history of antineoplastic chemotherapy: Secondary | ICD-10-CM | POA: Diagnosis not present

## 2022-12-11 DIAGNOSIS — I2510 Atherosclerotic heart disease of native coronary artery without angina pectoris: Secondary | ICD-10-CM | POA: Diagnosis not present

## 2022-12-11 DIAGNOSIS — C3411 Malignant neoplasm of upper lobe, right bronchus or lung: Secondary | ICD-10-CM | POA: Diagnosis not present

## 2022-12-11 DIAGNOSIS — Z85118 Personal history of other malignant neoplasm of bronchus and lung: Secondary | ICD-10-CM | POA: Insufficient documentation

## 2022-12-11 DIAGNOSIS — I10 Essential (primary) hypertension: Secondary | ICD-10-CM

## 2022-12-11 HISTORY — DX: Other complications of anesthesia, initial encounter: T88.59XA

## 2022-12-11 HISTORY — PX: FINE NEEDLE ASPIRATION: SHX6590

## 2022-12-11 HISTORY — PX: BRONCHIAL WASHINGS: SHX5105

## 2022-12-11 HISTORY — PX: BRONCHIAL BIOPSY: SHX5109

## 2022-12-11 HISTORY — PX: VIDEO BRONCHOSCOPY WITH ENDOBRONCHIAL ULTRASOUND: SHX6177

## 2022-12-11 HISTORY — PX: BRONCHIAL BRUSHINGS: SHX5108

## 2022-12-11 HISTORY — PX: FIDUCIAL MARKER PLACEMENT: SHX6858

## 2022-12-11 HISTORY — PX: BRONCHIAL NEEDLE ASPIRATION BIOPSY: SHX5106

## 2022-12-11 LAB — CBC
HCT: 43.9 % (ref 39.0–52.0)
Hemoglobin: 14.7 g/dL (ref 13.0–17.0)
MCH: 31.6 pg (ref 26.0–34.0)
MCHC: 33.5 g/dL (ref 30.0–36.0)
MCV: 94.4 fL (ref 80.0–100.0)
Platelets: 169 10*3/uL (ref 150–400)
RBC: 4.65 MIL/uL (ref 4.22–5.81)
RDW: 12.6 % (ref 11.5–15.5)
WBC: 6.4 10*3/uL (ref 4.0–10.5)
nRBC: 0 % (ref 0.0–0.2)

## 2022-12-11 LAB — BASIC METABOLIC PANEL
Anion gap: 12 (ref 5–15)
BUN: 20 mg/dL (ref 8–23)
CO2: 19 mmol/L — ABNORMAL LOW (ref 22–32)
Calcium: 9.2 mg/dL (ref 8.9–10.3)
Chloride: 108 mmol/L (ref 98–111)
Creatinine, Ser: 1.05 mg/dL (ref 0.61–1.24)
GFR, Estimated: >60 mL/min (ref >60)
Glucose, Bld: 96 mg/dL (ref 70–99)
Potassium: 4 mmol/L (ref 3.5–5.1)
Sodium: 139 mmol/L (ref 135–145)

## 2022-12-11 SURGERY — BRONCHOSCOPY, WITH BIOPSY USING ELECTROMAGNETIC NAVIGATION
Anesthesia: General

## 2022-12-11 MED ORDER — ONDANSETRON HCL 4 MG/2ML IJ SOLN
INTRAMUSCULAR | Status: DC | PRN
  Administered 2022-12-11: 4 mg via INTRAVENOUS

## 2022-12-11 MED ORDER — PHENYLEPHRINE HCL-NACL 20-0.9 MG/250ML-% IV SOLN
INTRAVENOUS | Status: DC | PRN
  Administered 2022-12-11: 50 ug/min via INTRAVENOUS

## 2022-12-11 MED ORDER — FENTANYL CITRATE (PF) 100 MCG/2ML IJ SOLN
INTRAMUSCULAR | Status: AC
  Filled 2022-12-11: qty 2

## 2022-12-11 MED ORDER — FENTANYL CITRATE (PF) 100 MCG/2ML IJ SOLN
INTRAMUSCULAR | Status: DC | PRN
  Administered 2022-12-11: 50 ug via INTRAVENOUS

## 2022-12-11 MED ORDER — PHENYLEPHRINE 80 MCG/ML (10ML) SYRINGE FOR IV PUSH (FOR BLOOD PRESSURE SUPPORT)
PREFILLED_SYRINGE | INTRAVENOUS | Status: DC | PRN
  Administered 2022-12-11 (×3): 160 ug via INTRAVENOUS
  Administered 2022-12-11: 80 ug via INTRAVENOUS
  Administered 2022-12-11: 160 ug via INTRAVENOUS
  Administered 2022-12-11: 80 ug via INTRAVENOUS

## 2022-12-11 MED ORDER — DROPERIDOL 2.5 MG/ML IJ SOLN: 0.6250 mg | Freq: Once | INTRAMUSCULAR | Status: DC | PRN

## 2022-12-11 MED ORDER — SUGAMMADEX SODIUM 200 MG/2ML IV SOLN
INTRAVENOUS | Status: DC | PRN
  Administered 2022-12-11: 400 mg via INTRAVENOUS

## 2022-12-11 MED ORDER — PROPOFOL 10 MG/ML IV BOLUS
INTRAVENOUS | Status: DC | PRN
  Administered 2022-12-11: 120 mg via INTRAVENOUS

## 2022-12-11 MED ORDER — ACETAMINOPHEN 10 MG/ML IV SOLN: 1000.0000 mg | Freq: Once | INTRAVENOUS | Status: DC | PRN

## 2022-12-11 MED ORDER — ROCURONIUM BROMIDE 10 MG/ML (PF) SYRINGE
PREFILLED_SYRINGE | INTRAVENOUS | Status: DC | PRN
  Administered 2022-12-11: 20 mg via INTRAVENOUS
  Administered 2022-12-11: 60 mg via INTRAVENOUS

## 2022-12-11 MED ORDER — CHLORHEXIDINE GLUCONATE 0.12 % MT SOLN
15.0000 mL | Freq: Once | OROMUCOSAL | Status: AC
  Administered 2022-12-11: 15 mL via OROMUCOSAL

## 2022-12-11 MED ORDER — PROPOFOL 500 MG/50ML IV EMUL
INTRAVENOUS | Status: DC | PRN
  Administered 2022-12-11: 100 ug/kg/min via INTRAVENOUS

## 2022-12-11 MED ORDER — LIDOCAINE 2% (20 MG/ML) 5 ML SYRINGE
INTRAMUSCULAR | Status: DC | PRN
  Administered 2022-12-11: 100 mg via INTRAVENOUS

## 2022-12-11 MED ORDER — FENTANYL CITRATE (PF) 100 MCG/2ML IJ SOLN: 25.0000 ug | INTRAMUSCULAR | Status: DC | PRN

## 2022-12-11 MED ORDER — DEXAMETHASONE SODIUM PHOSPHATE 10 MG/ML IJ SOLN
INTRAMUSCULAR | Status: DC | PRN
  Administered 2022-12-11: 10 mg via INTRAVENOUS

## 2022-12-11 MED ORDER — TELMISARTAN 80 MG PO TABS: 40.0000 mg | ORAL_TABLET | Freq: Every day | ORAL | Status: DC

## 2022-12-11 MED ORDER — LACTATED RINGERS IV SOLN: INTRAVENOUS | Status: DC

## 2022-12-11 SURGICAL SUPPLY — 2 items
Superlock Fiducial Marker IMPLANT
superlock fiducial marker IMPLANT

## 2022-12-11 NOTE — Anesthesia Procedure Notes (Signed)
Procedure Name: Intubation Date/Time: 12/11/2022 7:40 AM  Performed by: Orlin Hilding, CRNAPre-anesthesia Checklist: Patient identified, Emergency Drugs available, Suction available, Patient being monitored and Timeout performed Patient Re-evaluated:Patient Re-evaluated prior to induction Oxygen Delivery Method: Circle system utilized Preoxygenation: Pre-oxygenation with 100% oxygen Induction Type: IV induction Ventilation: Mask ventilation without difficulty and Oral airway inserted - appropriate to patient size Grade View: Grade I Tube type: Oral Tube size: 8.5 mm Number of attempts: 1 Placement Confirmation: ETT inserted through vocal cords under direct vision, positive ETCO2 and breath sounds checked- equal and bilateral Secured at: 23 cm Tube secured with: Tape Dental Injury: Teeth and Oropharynx as per pre-operative assessment

## 2022-12-11 NOTE — Op Note (Signed)
Video Bronchoscopy with Robotic Assisted Bronchoscopic Navigation and Endobronchial Ultrasound Procedure Note  Date of Operation: 12/11/2022   Pre-op Diagnosis: Bilateral pulmonary nodules, mediastinal adenopathy  Post-op Diagnosis: Same  Surgeon: Levy Pupa  Assistants: None  Anesthesia: General endotracheal anesthesia  Operation: Flexible video fiberoptic bronchoscopy with robotic assistance and biopsies.  Estimated Blood Loss: Minimal  Complications: None  Indications and History: Eric Hinton is a 82 y.o. male with history of right-sided lung cancer.  He is undergoing surveillance CT scans of the chest, has a slowly enlarging mildly hypermetabolic left upper lobe pulmonary nodule as well as 2 right upper lobe pulmonary nodules that are not hypermetabolic.  Also noted to have mediastinal adenopathy.  Recommendation made to achieve a tissue diagnosis via robotic assisted navigational bronchoscopy and endobronchial ultrasound. The risks, benefits, complications, treatment options and expected outcomes were discussed with the patient.  The possibilities of pneumothorax, pneumonia, reaction to medication, pulmonary aspiration, perforation of a viscus, bleeding, failure to diagnose a condition and creating a complication requiring transfusion or operation were discussed with the patient who freely signed the consent.    Description of Procedure: The patient was seen in the Preoperative Area, was examined and was deemed appropriate to proceed.  The patient was taken to [facility] endoscopy room 3, identified as Eric Hinton and the procedure verified as Flexible Video Fiberoptic Bronchoscopy.  A Time Out was held and the above information confirmed.   Prior to the date of the procedure a high-resolution CT scan of the chest was performed. Utilizing ION software program a virtual tracheobronchial tree was generated to allow the creation of distinct navigation pathways to the patient's  parenchymal abnormalities. After being taken to the operating room general anesthesia was initiated and the patient  was orally intubated. The video fiberoptic bronchoscope was introduced via the endotracheal tube and a general inspection was performed which showed normal right and left lung anatomy. Aspiration of the bilateral mainstems was completed to remove any remaining secretions. Robotic catheter inserted into patient's endotracheal tube.   Target #1 left upper lobe pulmonary nodule: The distinct navigation pathways prepared prior to this procedure were then utilized to navigate to patient's lesion identified on CT scan. The robotic catheter was secured into place and the vision probe was withdrawn.  Lesion location was approximated using fluoroscopy.  Local registration and targeting was performed using Cios three-dimensional imaging. Under fluoroscopic guidance transbronchial needle brushings, transbronchial needle biopsies, and transbronchial forceps biopsies were performed to be sent for cytology and pathology.  Under fluoroscopic guidance a single fiducial marker was placed adjacent to the nodule  Target #2 right upper lobe pulmonary nodule (more posterior lateral): The distinct navigation pathways prepared prior to this procedure were then utilized to navigate to patient's lesion identified on CT scan. The robotic catheter was secured into place and the vision probe was withdrawn.  Lesion location was approximated using fluoroscopy.  Local registration and targeting was performed using Cios three-dimensional imaging. Under fluoroscopic guidance transbronchial needle brushings, transbronchial needle biopsies, and transbronchial forceps biopsies were performed to be sent for cytology and pathology.  Under fluoroscopic guidance a single fiducial marker was placed adjacent to the nodule.  A bronchioalveolar lavage was performed in the right upper lobe and sent for microbiology.  Target #3 right  upper lobe pulmonary nodule (more anterior and central): The distinct navigation pathways prepared prior to this procedure were then utilized to navigate to patient's lesion identified on CT scan. The robotic catheter was secured into  place and the vision probe was withdrawn.  Lesion location was approximated using fluoroscopy.  Local registration and targeting was performed using Cios three-dimensional imaging. Under fluoroscopic guidance transbronchial needle brushings, transbronchial needle biopsies, and transbronchial forceps biopsies were performed to be sent for cytology and pathology.  Under fluoroscopic guidance a single fiducial marker was placed adjacent to the nodule.  The standard scope was then withdrawn and the endobronchial ultrasound was used to identify and characterize the peritracheal, hilar and bronchial lymph nodes. Inspection showed sized small nodes at station 4L and 4R.  There was enlargement at station 7 and station 11R. Using real-time ultrasound guidance Wang needle biopsies were take from Station 7 and 11R nodes and were sent for cytology.   At the end of the procedure a general airway inspection was performed and there was no evidence of active bleeding. The bronchoscope was removed.  The patient tolerated the procedure well. There was no significant blood loss and there were no obvious complications. A post-procedural chest x-ray is pending.  Samples Target #1: 1. Transbronchial needle brushings from left upper lobe nodule 2. Transbronchial Wang needle biopsies from left upper lobe nodule 3. Transbronchial forceps biopsies from left upper lobe nodule  Samples Target #2: 1. Transbronchial needle brushings from right upper lobe nodule 2. Transbronchial Wang needle biopsies from right upper lobe nodule 3. Transbronchial forceps biopsies from right upper lobe nodule 4. Bronchoalveolar lavage from right upper lobe  Samples Target #3: 1. Transbronchial needle brushings from  right upper lobe nodule 2. Transbronchial Wang needle biopsies from right upper lobe nodule 3. Transbronchial forceps biopsies from right upper lobe nodule  EBUS samples: 1. Wang needle biopsies from 7 node 2. Wang needle biopsies from 11R node   Plans:  The patient will be discharged from the PACU to home when recovered from anesthesia and after chest x-ray is reviewed. We will review the cytology, pathology and microbiology results with the patient when they become available. Outpatient followup will be with Dr. Delton Coombes.    Levy Pupa, MD, PhD 12/11/2022, 9:38 AM [facility] [phone] or if no answer before 7:00PM call [phone] For any issues after 7:00PM please call eLink [phone]

## 2022-12-11 NOTE — Anesthesia Postprocedure Evaluation (Signed)
Anesthesia Post Note  Patient: Furqan Grieco Micek  Procedure(s) Performed: ROBOTIC ASSISTED NAVIGATIONAL BRONCHOSCOPY VIDEO BRONCHOSCOPY WITH ENDOBRONCHIAL ULTRASOUND BRONCHIAL BRUSHINGS BRONCHIAL NEEDLE ASPIRATION BIOPSIES BRONCHIAL BIOPSIES FIDUCIAL MARKER PLACEMENT BRONCHIAL WASHINGS FINE NEEDLE ASPIRATION     Patient location during evaluation: PACU Anesthesia Type: General Level of consciousness: awake and alert Pain management: pain level controlled Vital Signs Assessment: post-procedure vital signs reviewed and stable Respiratory status: spontaneous breathing, nonlabored ventilation, respiratory function stable and patient connected to nasal cannula oxygen Cardiovascular status: blood pressure returned to baseline and stable Postop Assessment: no apparent nausea or vomiting Anesthetic complications: no  No notable events documented.  Last Vitals:  Vitals:   12/11/22 1015 12/11/22 1030  BP: (!) 113/58 (!) 103/55  Pulse: 81 79  Resp: 13 19  Temp:  36.9 C  SpO2: 93% 97%    Last Pain:  Vitals:   12/11/22 1030  TempSrc:   PainSc: 0-No pain                 Shanika Levings S

## 2022-12-11 NOTE — Interval H&P Note (Signed)
History and Physical Interval Note:  12/11/2022 7:25 AM  Eric Hinton  has presented today for surgery, with the diagnosis of BILATERAL PULMONARY NODULE,RIGHT HILAR ADENOPATHY.  The various methods of treatment have been discussed with the patient and family. After consideration of risks, benefits and other options for treatment, the patient has consented to  Procedure(s): ROBOTIC ASSISTED NAVIGATIONAL BRONCHOSCOPY (N/A) VIDEO BRONCHOSCOPY WITH ENDOBRONCHIAL ULTRASOUND (N/A) as a surgical intervention.  The patient's history has been reviewed, patient examined, no change in status, stable for surgery.  I have reviewed the patient's chart and labs.  Questions were answered to the patient's satisfaction.     Leslye Peer

## 2022-12-11 NOTE — Transfer of Care (Signed)
Immediate Anesthesia Transfer of Care Note  Patient: Eric Hinton  Procedure(s) Performed: ROBOTIC ASSISTED NAVIGATIONAL BRONCHOSCOPY VIDEO BRONCHOSCOPY WITH ENDOBRONCHIAL ULTRASOUND BRONCHIAL BRUSHINGS BRONCHIAL NEEDLE ASPIRATION BIOPSIES BRONCHIAL BIOPSIES FIDUCIAL MARKER PLACEMENT BRONCHIAL WASHINGS FINE NEEDLE ASPIRATION  Patient Location: PACU  Anesthesia Type:General  Level of Consciousness: awake, oriented, and patient cooperative  Airway & Oxygen Therapy: Patient Spontanous Breathing  Post-op Assessment: Report given to RN and Post -op Vital signs reviewed and stable  Post vital signs: Reviewed and stable  Last Vitals:  Vitals Value Taken Time  BP 101/51 12/11/22 0934  Temp 36.9 C 12/11/22 0934  Pulse 78 12/11/22 0938  Resp 12 12/11/22 0938  SpO2 93 % 12/11/22 0938  Vitals shown include unfiled device data.  Last Pain:  Vitals:   12/11/22 0934  TempSrc:   PainSc: 0-No pain         Complications: No notable events documented.

## 2022-12-11 NOTE — Discharge Instructions (Signed)
Flexible Bronchoscopy, Care After This sheet gives you information about how to care for yourself after your test. Your doctor may also give you more specific instructions. If you have problems or questions, contact your doctor. Follow these instructions at home: Eating and drinking When your numbness is gone and your cough and gag reflexes have come back, you may: Eat only soft foods. Slowly drink liquids. The day after the test, go back to your normal diet. Driving Do not drive for 24 hours if you were given a medicine to help you relax (sedative). Do not drive or use heavy machinery while taking prescription pain medicine. General instructions  Take over-the-counter and prescription medicines only as told by your doctor. Return to your normal activities as told. Ask what activities are safe for you. Do not use any products that have nicotine or tobacco in them. This includes cigarettes and e-cigarettes. If you need help quitting, ask your doctor. Keep all follow-up visits as told by your doctor. This is important. It is very important if you had a tissue sample (biopsy) taken. Get help right away if: You have shortness of breath that gets worse. You get light-headed. You feel like you are going to pass out (faint). You have chest pain. You cough up: More than a little blood. More blood than before. Summary Do not eat or drink anything (not even water) for 2 hours after your test, or until your numbing medicine wears off. Do not use cigarettes. Do not use e-cigarettes. Get help right away if you have chest pain.  Please contact our office for any questions or concerns.  [phone].  This information is not intended to replace advice given to you by your health care provider. Make sure you discuss any questions you have with your health care provider. Document Released: 01/07/2009 Document Revised: 02/22/2017 Document Reviewed: 03/30/2016 Elsevier Patient Education  2020  [employer].

## 2022-12-13 LAB — CULTURE, BAL-QUANTITATIVE W GRAM STAIN: Culture: NO GROWTH

## 2022-12-13 LAB — ACID FAST SMEAR (AFB, MYCOBACTERIA): Acid Fast Smear: NEGATIVE

## 2022-12-13 LAB — CYTOLOGY - NON PAP

## 2022-12-14 ENCOUNTER — Telehealth: Payer: Self-pay | Admitting: Emergency Medicine

## 2022-12-14 NOTE — Telephone Encounter (Signed)
Reviewed the transbronchial biopsies with the patient.  He has squamous cell lung cancer confirmed in the left upper lobe nodule, one of the right upper lobe nodules.  The other right upper lobe nodule showed atypical cells, likely the same.  He would like to go back to discussed the findings with Dr. Ellin Saba, talk about possible options for therapy.  FYI to Dr. Ellin Saba

## 2022-12-15 ENCOUNTER — Encounter (HOSPITAL_COMMUNITY): Payer: Self-pay | Admitting: Emergency Medicine

## 2022-12-16 LAB — AEROBIC/ANAEROBIC CULTURE W GRAM STAIN (SURGICAL/DEEP WOUND): Culture: NO GROWTH

## 2022-12-20 ENCOUNTER — Other Ambulatory Visit: Payer: Self-pay

## 2022-12-21 NOTE — Progress Notes (Signed)
The proposed treatment discussed in conference is for discussion purpose only and is not a binding recommendation.  The patients have not been physically examined, or presented with their treatment options.  Therefore, final treatment plans cannot be decided.  

## 2022-12-24 NOTE — Progress Notes (Signed)
[facility] [address], [place] [address]    Clinic Day:  12/24/2022  Referring physician: Benita Stabile, MD  Patient Care Team: Benita Stabile, MD as PCP - General (Internal Medicine) Doreatha Massed, MD as Medical Oncologist (Medical Oncology)   ASSESSMENT & PLAN:   Assessment: 1. Stage IIIa (T1 aN2 M0) squamous cell carcinoma of right lung: - Diagnosed in #2013, status post concurrent chemoradiation therapy with carboplatin and paclitaxel weekly, treated in [place], [place]. - He is being followed by Dr. Arbutus Ped in [place] once a year with scans. - Last CT chest with contrast on 02/20/2021 did not show any evidence of recurrence.  2. Social/family history: - He is recently widowed and lives by himself.  He is retired from [employer] and also worked as a Audiological scientist at [employer]. - Current active smoker, smokes 2 to 3 packs/day for the last 74 years. - Mother had "cancer in her abdomen".  Father and 4 paternal uncles had prostate cancer.  3.  Prostate cancer: - Diagnosed in 2013 and had seed implant and "Calypso treatment" with 5 treatments of radiation.  He reports that his most recent PSA was undetectable.    Plan: 1.  Stage IIIa (T1 N2 M0) right lung squamous cell carcinoma: - CT chest (09/04/2022): Enlarged right hilar lymph node measuring 14 mm, previously 12 mm.  Irregular solid left upper lobe lung nodule increased in size.  New part solid nodule of the right upper lobe measuring 11 x 8 mm with 4 mm solid component. - PET scan (09/20/2022): Left upper lobe 1.2 cm nodule is hypermetabolic SUV 2.6.  Mixed attenuation posterior right upper lobe nodule is mildly hypermetabolic at 11 mm, SUV 1.8.  Enlarging right hilar lymph node 1.4 cm with SUV 3.5. - Findings are highly concerning for recurrence/new primary. - Recommend bronchoscopy and biopsy.  Will make referral to pulmonary. - RTC 1 week after biopsy.    2.   Prostate cancer: - I have recommended genetics evaluation because of personal and family history.    No orders of the defined types were placed in this encounter.     Alben Deeds Teague,acting as a Neurosurgeon for Doreatha Massed, MD.,have documented all relevant documentation on the behalf of Doreatha Massed, MD,as directed by  Doreatha Massed, MD while in the presence of Doreatha Massed, MD.  ***   [place] R Teague   9/30/20248:35 PM  CHIEF COMPLAINT:   Diagnosis: right lung squamous cell carcinoma    Cancer Staging  Squamous cell carcinoma of right lung (HCC) Staging form: Lung, AJCC 7th Edition - Clinical: Stage Unknown (TX, N2, M0) - Signed by Ellouise Newer, PA-C on 07/15/2015    Prior Therapy: chemoradiation therapy   Current Therapy:  surveillance    HISTORY OF PRESENT ILLNESS:   Oncology History Overview Note  H/O Stage IIIA poorly differentiated squamous cell carcinoma of right lung (TXN2M0) treated with concurrent chemoradiation consisting of weekly Carbo/Taxo (02/28/2012- 04/17/2012) complicated by hospitalizations (2) and issues with thrombocytopenia resulting in completing 6/8 cycles of chemotherapy.  He underwent XRT from 02/25/2012- 04/29/2012.   Squamous cell carcinoma of right lung (HCC)  12/03/2011 PET scan   Persistent focal uptake in the rectoanal bowel, benign inflammatory possible neoplastic process cannot be excluded. Multiple enlarging hypermetabolic mediastinal lymph nodes concerning for malignancy. RML density favoring benign etiology   12/13/2011 Procedure   Colonoscopy found tubular adenomas at 75 cm and 40 cm  01/14/2012 Imaging   CT chest-no significant interval change in mediastinal lymphadenopathy when compared to 11/2011.  Spiculated opacity in the right upper lobe not significantly changed from 11/2011. Stable RML noncalcified on her nodules. Diffuse emphysema.   02/08/2012 Procedure   Bronchoscopy revealing chronic bronchitis,  multiple mucous plugs are present and lavaged out from the lung. Needle aspiration of mediastinal lymph node performed.   02/08/2012 Pathology Results   Needle aspiration of mediastinal lymph node-Poorly differentiated squamous cell carcinoma   02/19/2012 Imaging   MRI brain-negative   02/19/2012 Cancer Staging   Clinically Stage IIIA   02/25/2012 - 04/29/2012 Radiation Therapy   Dr. Lissa Merlin   02/28/2012 - 04/17/2012 Chemotherapy   Weekly Carbo/Taxol requiring dose reduction due to thrombocytopenia/hospitalization.   03/27/2012 Treatment Plan Change   Treatment held due to thrombocytopenia/hospitalization   04/10/2012 Treatment Plan Change   Treatment held due to thrombocytopenia/hospitalization   04/17/2012 Treatment Plan Change   Patient refused any further chemotherapy.   05/19/2015 Imaging   CT chest- Mild interval increase in the prominence of chronic irregular densities in the anterior right mid lung. Most compatible with areas of scarring and atelectasis. Recommend CT follow-up in 3-6 months. Mildly prominent right hilar lymph nodes.   08/17/2015 Imaging   CT chest- Areas of architectural distortion, bronchiectasis + consolidation in the RUL + RML are presumably treatment related. Given absence of a recent prior comparison examination, it is difficult to definitively exclude residual disease.      INTERVAL HISTORY:   Jaylene is a 82 y.o. male presenting to clinic today for follow up of right lung squamous cell carcinoma. He was last seen by me on 09/26/22.  Since his last visit, he underwent a bronchoscopy on 12/11/22 with Dr. Delton Coombes. Cytology of station 7 and 11R lymph nodes revealed no malignant cells and benign lymphocytes. He had a CT chest on 11/27/22 that revealed: stable appearance of the lungs; bandlike opacity in the right upper lobe centrally is stable as well as the adjacent right upper lobe areas of separate nodularity; the left upper lobe spiculated nodule is similar as well; and  a 5 mm central left upper lobe nodule which is now cavitary.  Today, he states that he is doing well overall. His appetite level is at ***%. His energy level is at ***%.  PAST MEDICAL HISTORY:   Past Medical History: Past Medical History:  Diagnosis Date   Arthritis    Carotid artery disease (HCC)    Status post bilateral CEA - Dr. Arbie Cookey   Complication of anesthesia    unable to void afterwards   Constipation    COPD (chronic obstructive pulmonary disease) (HCC)    DDD (degenerative disc disease), cervical    Degenerative disc disease, lumbar    Essential hypertension    Hyperlipidemia    Macular degeneration    Prostate cancer (HCC)    Skin cancer, basal cell    Squamous cell lung cancer (HCC) 07/15/2015   Status post chemotherapy and XRT - Dr. Arbutus Ped   Tubular adenoma of colon     Surgical History: Past Surgical History:  Procedure Laterality Date   BRONCHIAL BIOPSY  12/11/2022   Procedure: BRONCHIAL BIOPSIES;  Surgeon: Leslye Peer, MD;  Location: [facility] ENDOSCOPY;  Service: Pulmonary;;   BRONCHIAL BRUSHINGS  12/11/2022   Procedure: BRONCHIAL BRUSHINGS;  Surgeon: Leslye Peer, MD;  Location: [facility] ENDOSCOPY;  Service: Pulmonary;;   BRONCHIAL NEEDLE ASPIRATION BIOPSY  12/11/2022   Procedure: BRONCHIAL NEEDLE ASPIRATION BIOPSIES;  Surgeon: Leslye Peer, MD;  Location: [facility] ENDOSCOPY;  Service: Pulmonary;;   BRONCHIAL WASHINGS  12/11/2022   Procedure: BRONCHIAL WASHINGS;  Surgeon: Leslye Peer, MD;  Location: [facility] ENDOSCOPY;  Service: Pulmonary;;   CATARACT EXTRACTION W/ INTRAOCULAR LENS  IMPLANT, BILATERAL Bilateral    COLONOSCOPY     COLONOSCOPY WITH PROPOFOL N/A 05/17/2017   Procedure: COLONOSCOPY WITH PROPOFOL;  Surgeon: Malissa Hippo, MD;  Location: [facility] ENDO SUITE;  Service: Endoscopy;  Laterality: N/A;  10:15   ENDARTERECTOMY Right 08/29/2015   Procedure: RIGHT CAROTID ENDARTERECTOMY WITH PATCH ANGIOPLASTY;  Surgeon: Larina Earthly, MD;  Location: [facility] OR;  Service:  Vascular;  Laterality: Right;   ENDARTERECTOMY Left 10/07/2015   Procedure: LEFT CAROTID ARTERY ENDARTERECTOMY;  Surgeon: Larina Earthly, MD;  Location: [facility] OR;  Service: Vascular;  Laterality: Left;   FIDUCIAL MARKER PLACEMENT  12/11/2022   Procedure: FIDUCIAL MARKER PLACEMENT;  Surgeon: Leslye Peer, MD;  Location: [facility] ENDOSCOPY;  Service: Pulmonary;;   FINE NEEDLE ASPIRATION  12/11/2022   Procedure: FINE NEEDLE ASPIRATION;  Surgeon: Leslye Peer, MD;  Location: [facility] ENDOSCOPY;  Service: Pulmonary;;   NOSE SURGERY     "rebulit my nose; related to skin cancer"   PATCH ANGIOPLASTY Left 10/07/2015   Procedure: With HEMASHIELD PLATINUM PATCH ANGIOPLASTY;  Surgeon: Larina Earthly, MD;  Location: [facility] OR;  Service: Vascular;  Laterality: Left;   POLYPECTOMY  05/17/2017   Procedure: POLYPECTOMY;  Surgeon: Malissa Hippo, MD;  Location: [facility] ENDO SUITE;  Service: Endoscopy;;  colon    PROSTATE BIOPSY     SKIN CANCER EXCISION Right    neck   TONSILLECTOMY     VIDEO BRONCHOSCOPY WITH ENDOBRONCHIAL ULTRASOUND N/A 12/11/2022   Procedure: VIDEO BRONCHOSCOPY WITH ENDOBRONCHIAL ULTRASOUND;  Surgeon: Leslye Peer, MD;  Location: [facility] ENDOSCOPY;  Service: Pulmonary;  Laterality: N/A;    Social History: Social History   Socioeconomic History   Marital status: Married    Spouse name: Not on file   Number of children: Not on file   Years of education: Not on file   Highest education level: Not on file  Occupational History   Not on file  Tobacco Use   Smoking status: Every Day    Current packs/day: 2.00    Average packs/day: 2.0 packs/day for 76.7 years (153.4 ttl pk-yrs)    Types: Cigarettes    Start date: [birth year]/1946   Smokeless tobacco: Never  Vaping Use   Vaping status: Never Used  Substance and Sexual Activity   Alcohol use: Not Currently    Alcohol/week: 35.0 standard drinks of alcohol    Types: 35 Cans of beer per week   Drug use: No   Sexual activity: Not on file  Other Topics Concern    Not on file  Social History Narrative   Not on file   Social Determinants of Health   Financial Resource Strain: Not on file  Food Insecurity: Not on file  Transportation Needs: Not on file  Physical Activity: Not on file  Stress: Not on file  Social Connections: Not on file  Intimate Partner Violence: Not on file    Family History: Family History  Problem Relation Age of Onset   Cancer Mother    Stroke Father    Heart disease Father    Diabetes Mellitus II Sister     Current Medications:  Current Outpatient Medications:    aspirin 325 MG tablet, Take 1 tablet (325 mg total) by  mouth daily., Disp: , Rfl:    atorvastatin (LIPITOR) 10 MG tablet, Take 10 mg by mouth daily., Disp: , Rfl:    bevacizumab (AVASTIN) 400 MG/16ML SOLN, 1.25 mg every 8 (eight) weeks. Left eye, Disp: , Rfl:    Cyanocobalamin (VITAMIN B-12) 2500 MCG SUBL, Place 2,500 mcg under the tongue daily., Disp: , Rfl:    FLUoxetine (PROZAC) 20 MG capsule, Take 20 mg by mouth daily., Disp: , Rfl:    latanoprost (XALATAN) 0.005 % ophthalmic solution, Place 1 drop into both eyes at bedtime., Disp: , Rfl:    menthol-zinc oxide (GOLD BOND) powder, Apply 1 application topically daily as needed (for itching)., Disp: , Rfl:    Multiple Vitamins-Minerals (PRESERVISION AREDS 2) CAPS, Take 1 capsule by mouth 2 (two) times daily., Disp: , Rfl:    naproxen sodium (ANAPROX) 220 MG tablet, Take 440 mg by mouth 2 (two) times daily as needed (for pain)., Disp: , Rfl:    tamsulosin (FLOMAX) 0.4 MG CAPS capsule, TAKE 1 CAPSULE BY MOUTH DAILY, Disp: 30 capsule, Rfl: 0   telmisartan (MICARDIS) 80 MG tablet, Take 0.5 tablets (40 mg total) by mouth daily., Disp: , Rfl:    Allergies: Allergies  Allergen Reactions   Bupropion Hives    REVIEW OF SYSTEMS:   Review of Systems  Constitutional:  Negative for chills, fatigue and fever.  HENT:   Negative for lump/mass, mouth sores, nosebleeds, sore throat and trouble swallowing.   Eyes:   Negative for eye problems.  Respiratory:  Negative for cough and shortness of breath.   Cardiovascular:  Negative for chest pain, leg swelling and palpitations.  Gastrointestinal:  Negative for abdominal pain, constipation, diarrhea, nausea and vomiting.  Genitourinary:  Negative for bladder incontinence, difficulty urinating, dysuria, frequency, hematuria and nocturia.   Musculoskeletal:  Negative for arthralgias, back pain, flank pain, myalgias and neck pain.  Skin:  Negative for itching and rash.  Neurological:  Negative for dizziness, headaches and numbness.  Hematological:  Does not bruise/bleed easily.  Psychiatric/Behavioral:  Negative for depression, sleep disturbance and suicidal ideas. The patient is not nervous/anxious.   All other systems reviewed and are negative.    VITALS:   There were no vitals taken for this visit.  Wt Readings from Last 3 Encounters:  12/11/22 163 lb (73.9 kg)  11/13/22 173 lb 9.6 oz (78.7 kg)  09/18/22 173 lb 9.6 oz (78.7 kg)    There is no height or weight on file to calculate BMI.  Performance status (ECOG): 1 - Symptomatic but completely ambulatory  PHYSICAL EXAM:   Physical Exam Vitals and nursing note reviewed. Exam conducted with a chaperone present.  Constitutional:      Appearance: Normal appearance.  Cardiovascular:     Rate and Rhythm: Normal rate and regular rhythm.     Pulses: Normal pulses.     Heart sounds: Normal heart sounds.  Pulmonary:     Effort: Pulmonary effort is normal.     Breath sounds: Normal breath sounds.  Abdominal:     Palpations: Abdomen is soft. There is no hepatomegaly, splenomegaly or mass.     Tenderness: There is no abdominal tenderness.  Musculoskeletal:     Right lower leg: No edema.     Left lower leg: No edema.  Lymphadenopathy:     Cervical: No cervical adenopathy.     Right cervical: No superficial, deep or posterior cervical adenopathy.    Left cervical: No superficial, deep or posterior  cervical adenopathy.  Upper Body:     Right upper body: No supraclavicular or axillary adenopathy.     Left upper body: No supraclavicular or axillary adenopathy.  Neurological:     General: No focal deficit present.     Mental Status: He is alert and oriented to person, place, and time.  Psychiatric:        Mood and Affect: Mood normal.        Behavior: Behavior normal.     LABS:      Latest Ref Rng & Units 12/11/2022    6:28 AM 09/04/2022   12:07 PM 02/20/2022   12:22 PM  CBC  WBC 4.0 - 10.5 K/uL 6.4  7.6  4.7   Hemoglobin 13.0 - 17.0 g/dL 78.2  95.6  21.3   Hematocrit 39.0 - 52.0 % 43.9  40.1  41.8   Platelets 150 - 400 K/uL 169  237  174       Latest Ref Rng & Units 12/11/2022    6:28 AM 09/04/2022   12:07 PM 02/20/2022   12:22 PM  CMP  Glucose 70 - 99 mg/dL 96  086  578   BUN 8 - 23 mg/dL 20  17  15    Creatinine 0.61 - 1.24 mg/dL 4.69  6.29  5.28   Sodium 135 - 145 mmol/L 139  137  139   Potassium 3.5 - 5.1 mmol/L 4.0  3.8  3.8   Chloride 98 - 111 mmol/L 108  102  105   CO2 22 - 32 mmol/L 19  26  27    Calcium 8.9 - 10.3 mg/dL 9.2  9.1  9.1   Total Protein 6.5 - 8.1 g/dL  6.7  6.6   Total Bilirubin 0.3 - 1.2 mg/dL  0.8  0.8   Alkaline Phos 38 - 126 U/L  65  58   AST 15 - 41 U/L  28  17   ALT 0 - 44 U/L  57  19      No results found for: "CEA1", "CEA" / No results found for: "CEA1", "CEA" No results found for: "PSA1" No results found for: "UXL244" No results found for: "CAN125"  No results found for: "TOTALPROTELP", "ALBUMINELP", "A1GS", "A2GS", "BETS", "BETA2SER", "GAMS", "MSPIKE", "SPEI" No results found for: "TIBC", "FERRITIN", "IRONPCTSAT" No results found for: "LDH"   STUDIES:   DG Chest Port 1 View  Result Date: 12/11/2022 CLINICAL DATA:  [id number] S/P bronchoscopy with biopsy [id number]. EXAM: PORTABLE CHEST 1 VIEW COMPARISON:  Chest CT 11/27/2022. FINDINGS: Post bronchoscopy changes in the bilateral upper lobes. No pneumothorax. Unchanged scattered  nodular opacities in both lungs. Stable cardiac and mediastinal contours. No pleural effusion. IMPRESSION: Post bronchoscopy changes in the bilateral upper lobes. No pneumothorax. Electronically Signed   By: Orvan Falconer M.D.   On: 12/11/2022 11:17   DG C-Arm 1-60 Min-No Report  Result Date: 12/11/2022 Fluoroscopy was utilized by the requesting physician.  No radiographic interpretation.   DG C-ARM BRONCHOSCOPY  Result Date: 12/11/2022 C-ARM BRONCHOSCOPY: Fluoroscopy was utilized by the requesting physician.  No radiographic interpretation.   CT Super D Chest Wo Contrast  Result Date: 12/05/2022 CLINICAL DATA:  Squamous cell carcinoma right lung. * Tracking Code: BO * EXAM: CT CHEST WITHOUT CONTRAST TECHNIQUE: Multidetector CT imaging of the chest was performed using thin slice collimation for electromagnetic bronchoscopy planning purposes, without intravenous contrast. RADIATION DOSE REDUCTION: This exam was performed according to the departmental dose-optimization program which includes automated exposure control, adjustment of the mA  and/or kV according to patient size and/or use of iterative reconstruction technique. COMPARISON:  CT 09/04/2022 and older. Numerous prior CT scans. PET-CT 09/20/2022 FINDINGS: Cardiovascular: Small pericardial effusion. Heart is nonenlarged. Coronary artery calcifications are seen. There also calcifications along the thoracic aorta in the aortic valve region. Normal caliber thoracic aorta. Mediastinum/Nodes: Stable cystic nodule along the right thyroid lobe measuring 14 mm. Normal caliber thoracic esophagus. On this non IV contrast exam there is no specific abnormal lymph node enlargement identified in the axillary regions or mediastinum. On the PET-CT and prior contrast chest CT there is a right-sided hilar lymph node. On the CT this measured 14 mm. In short axis a today on series 2, image 89, 12 mm when measured in the same fashion. Smaller node more proximal along  the right hilum on image 70 is also stable centrally. Lungs/Pleura: Emphysematous lung changes identified. Left lung is without consolidation, pneumothorax or effusion. There is a spiculated left upper lobe nodule measuring 9 mm on series 3, image 35 today. Previously 11 mm. This had some mild uptake on PET-CT. More centrally in the left upper lobe on series 3, image 69 has a small cavitary 5 mm lesion. On the prior CT scan from June 2024 this measured 5 mm but overall subjectively slightly smaller. At that time lesion was not cavitary. No new left-sided lung nodule. Consolidative type opacity in the anterior right upper lobe extending from the pleural to the hilum bronchiectasis and distortion is stable. The separate small nodule in the peripheral right upper lobe on series 3 image 42 measuring 7 mm is also unchanged overall when adjusting for technique. The more caudal area in the right upper lobe which was previously measured at a AP diameter of 10 mm, today on series 3, image 63 measures 9 mm. Persistent areas of bronchial wall thickening in the right upper lobe. Scar like changes in the middle lobe anteriorly are stable. Upper Abdomen: Adrenal glands are preserved in the upper abdomen. The adrenal glands are slightly thickened. Stable multiple hepatic benign cystic foci. The stomach is distended with fluid and debris. Musculoskeletal: Moderate degenerative changes of the spine. Once again there is a healing fracture of the anterior aspect of the right fifth rib. IMPRESSION: Essentially stable appearance of the lungs. Bandlike opacity in the right upper lobe centrally is stable as well as the adjacent right upper lobe areas of separate nodularity. The left upper lobe spiculated nodule is similar as well. There is 5 mm central left upper lobe nodule which is now cavitary. Attention on follow-up. Stable right-sided hilar lymph nodes. Aortic Atherosclerosis (ICD10-I70.0) and Emphysema (ICD10-J43.9). Electronically  Signed   By: Karen Kays M.D.   On: 12/05/2022 11:59

## 2022-12-25 ENCOUNTER — Inpatient Hospital Stay: Payer: Medicare Other | Attending: Hematology | Admitting: Hematology

## 2022-12-25 VITALS — BP 121/81 | HR 82 | Temp 98.4°F | Resp 18 | Ht 63.0 in | Wt 172.5 lb

## 2022-12-25 DIAGNOSIS — C3491 Malignant neoplasm of unspecified part of right bronchus or lung: Secondary | ICD-10-CM | POA: Diagnosis not present

## 2022-12-25 DIAGNOSIS — Z85118 Personal history of other malignant neoplasm of bronchus and lung: Secondary | ICD-10-CM | POA: Insufficient documentation

## 2022-12-25 DIAGNOSIS — Z8546 Personal history of malignant neoplasm of prostate: Secondary | ICD-10-CM | POA: Insufficient documentation

## 2022-12-25 DIAGNOSIS — Z08 Encounter for follow-up examination after completed treatment for malignant neoplasm: Secondary | ICD-10-CM | POA: Diagnosis not present

## 2022-12-25 NOTE — Patient Instructions (Addendum)
[facility] at [facility] Discharge Instructions   You were seen and examined today by Dr. Ellin Saba.  He reviewed the results of your biopsy. It is showing squamous cell cancer of the left upper lung and right upper lung. These areas are small. Dr. Kirtland Bouchard believes the best course of treatment would be radiation to treat these areas. This radiation is done in [place] [facility] [place].      Thank you for choosing [facility] at [facility] to provide your oncology and hematology care.  To afford each patient quality time with our provider, please arrive at least 15 minutes before your scheduled appointment time.   If you have a lab appointment with the Cancer Center please come in thru the Main Entrance and check in at the main information desk.  You need to re-schedule your appointment should you arrive 10 or more minutes late.  We strive to give you quality time with our providers, and arriving late affects you and other patients whose appointments are after yours.  Also, if you no show three or more times for appointments you may be dismissed from the clinic at the providers discretion.     Again, thank you for choosing [facility].  Our hope is that these requests will decrease the amount of time that you wait before being seen by our physicians.       _____________________________________________________________  Should you have questions after your visit to [facility], please contact our office at [phone] and follow the prompts.  Our office hours are 8:00 a.m. and 4:30 p.m. Monday - Friday.  Please note that voicemails left after 4:00 p.m. may not be returned until the following business day.  We are closed weekends and major holidays.  You do have access to a nurse 24-7, just call the main number to the clinic [phone] and do not press any options, hold on the line and a nurse will answer the phone.    For  prescription refill requests, have your pharmacy contact our office and allow 72 hours.    Due to Covid, you will need to wear a mask upon entering the hospital. If you do not have a mask, a mask will be given to you at the Main Entrance upon arrival. For doctor visits, patients may have 1 support person age 41 or older with them. For treatment visits, patients can not have anyone with them due to social distancing guidelines and our immunocompromised population.

## 2022-12-28 NOTE — Progress Notes (Signed)
Location of tumor and Histology per Pathology Report: Bilateral pulmonary nodules  Biopsy:     Past/Anticipated interventions by surgeon, if any:     Past/Anticipated interventions by medical oncology, if any: Dr. Lorelle Formosa 12/25/22     Pain issues, if ZOX:[id number] pain   SAFETY ISSUES: Prior radiation? Yes,he reports roughly twelve years ago. Pacemaker/ICD? no Possible current pregnancy? no Is the patient on methotrexate? no  Current Complaints / other details:  He wants an opinion on his current diagnosis. He also wants to know how many weeks will he have to take treatment.

## 2022-12-31 NOTE — Progress Notes (Signed)
Radiation Oncology         [phone] ________________________________  Initial Outpatient Consultation  Name: Eric Hinton MRN: [id number]  Date: 01/01/2023  DOB: 1940/08/20  EX:BMWU, Kathleene Hazel, MD  Doreatha Massed, MD   REFERRING PHYSICIAN: Doreatha Massed, MD  DIAGNOSIS: {There were no encounter diagnoses. (Refresh or delete this SmartLink)}  Bilateral lung cancers: Squamous cell carcinoma of the right upper lobe and left upper lobe   History of squamous cell carcinoma of right lung diagnosed in 2013, s/p concurrent chemoradiation   History of prostate cancer also diagnosed in 2013, s/p Calypso treatment  HISTORY OF PRESENT ILLNESS::Eric Hinton is a 82 y.o. male who is accompanied by ***. he is seen as a courtesy of Dr. Ellin Saba for an opinion concerning radiation therapy as part of management for his recently diagnosed squamous cell lung cancer of the bilateral upper lobes. He has a past medical history notable for prostate cancer diagnosed in 2013 s/p radiation, and stage IIIa squamous cell right lung cancer diagnosed in 2013 s/p chemoradiation (treatment given in [place]). He was initially followed by Dr. Arbutus Ped after moving to [facility] and has since been followed by Dr. Ellin Saba for surveillance of his disease with routine imaging.  He presented for a surveillance chest CT on 09/04/22 which demonstrated: an increase in size of a right hilar lymph node concerning for metastatic disease; an increase in size of a solid LUL pulmonary nodule concerning for a synchronous primary malignancy; and a new part solid nodule in the RUL.   A PET scan was subsequently performed on 09/19/22 which demonstrated mild hypermetabolism associated with the right hilar/infrahilar node concerning for nodal metastatic disease; hypermetabolism associated with the LUL nodule favoring a metachronous primary bronchogenic carcinoma; and low level activity corresponding to the indeterminate  posterolateral RUL nodule. A hypermetabolic deep left parotid nodule was also noted, favored to represent an incidental primary such as a Warthin's tumor. PET otherwise showed no definite evidence of tracer avid distant metastatic disease  Super-D chest CT without contrast on 11/27/22 showed stable findings pertaining to both the RUL and LUL nodules. (The right sided hilar lymph nodes also showed interval stability).   Dr. Ellin Saba reviewed these findings with the patient and referred him to pulmonary medicine for bronchoscopy and biopsies. He was finally able to meet with Dr. Santina Evans in late August and later underwent a bronchoscopy with biopsies on 12/11/22. Cytology results are detailed as follows:  -- FNA of the LUL nodule showed squamous cell carcinoma.  -- FNA of the RUL nodule showed squamous cell carcinoma  -- FNA of a station 7 lymph node and of a station 11R lymph node both showed no evidence of malignancy.   Based on his advanced age and overall functional status, Dr. Ellin Saba has recommended treatment consisting of primary SBRT of both lesions which we will discuss in detail today. (OF note: his case was also presented at the tumor board held on 12/20/22).   PREVIOUS RADIATION THERAPY: Yes   Stage IIIa (T1 aN2 M0) squamous cell carcinoma of right lung diagnosed in 2013, s/p concurrent chemoradiation therapy with carboplatin and paclitaxel weekly, treated in [place], [place]. -- Radiation treatment dates: 02/25/2012 through 04/29/2012  -- Radiation oncologist: Dr. Lissa Merlin   Prostate cancer diagnosed in 2013, treated with seed implant and "Calypso treatment" completed in November 2015 with 5 treatments of radiation.    PAST MEDICAL HISTORY:  Past Medical History:  Diagnosis Date   Arthritis    Carotid artery  disease [facility])    Status post bilateral CEA - Dr. Arbie Cookey   Complication of anesthesia    unable to void afterwards   Constipation    COPD (chronic obstructive pulmonary  disease) (HCC)    DDD (degenerative disc disease), cervical    Degenerative disc disease, lumbar    Essential hypertension    Hyperlipidemia    Macular degeneration    Prostate cancer (HCC)    Skin cancer, basal cell    Squamous cell lung cancer (HCC) 07/15/2015   Status post chemotherapy and XRT - Dr. Arbutus Ped   Tubular adenoma of colon     PAST SURGICAL HISTORY: Past Surgical History:  Procedure Laterality Date   BRONCHIAL BIOPSY  12/11/2022   Procedure: BRONCHIAL BIOPSIES;  Surgeon: Leslye Peer, MD;  Location: [facility] ENDOSCOPY;  Service: Pulmonary;;   BRONCHIAL BRUSHINGS  12/11/2022   Procedure: BRONCHIAL BRUSHINGS;  Surgeon: Leslye Peer, MD;  Location: [facility] ENDOSCOPY;  Service: Pulmonary;;   BRONCHIAL NEEDLE ASPIRATION BIOPSY  12/11/2022   Procedure: BRONCHIAL NEEDLE ASPIRATION BIOPSIES;  Surgeon: Leslye Peer, MD;  Location: [facility] ENDOSCOPY;  Service: Pulmonary;;   BRONCHIAL WASHINGS  12/11/2022   Procedure: BRONCHIAL WASHINGS;  Surgeon: Leslye Peer, MD;  Location: [facility] ENDOSCOPY;  Service: Pulmonary;;   CATARACT EXTRACTION W/ INTRAOCULAR LENS  IMPLANT, BILATERAL Bilateral    COLONOSCOPY     COLONOSCOPY WITH PROPOFOL N/A 05/17/2017   Procedure: COLONOSCOPY WITH PROPOFOL;  Surgeon: Malissa Hippo, MD;  Location: [facility] ENDO SUITE;  Service: Endoscopy;  Laterality: N/A;  10:15   ENDARTERECTOMY Right 08/29/2015   Procedure: RIGHT CAROTID ENDARTERECTOMY WITH PATCH ANGIOPLASTY;  Surgeon: Larina Earthly, MD;  Location[facility] OR;  Service: Vascular;  Laterality: Right;   ENDARTERECTOMY Left 10/07/2015   Procedure: LEFT CAROTID ARTERY ENDARTERECTOMY;  Surgeon: Larina Earthly, MD;  Location: [facility] OR;  Service: Vascular;  Laterality: Left;   FIDUCIAL MARKER PLACEMENT  12/11/2022   Procedure: FIDUCIAL MARKER PLACEMENT;  Surgeon: Leslye Peer, MD;  Location: [facility] ENDOSCOPY;  Service: Pulmonary;;   FINE NEEDLE ASPIRATION  12/11/2022   Procedure: FINE NEEDLE ASPIRATION;  Surgeon: Leslye Peer, MD;   Location: [facility] ENDOSCOPY;  Service: Pulmonary;;   NOSE SURGERY     "rebulit my nose; related to skin cancer"   PATCH ANGIOPLASTY Left 10/07/2015   Procedure: With HEMASHIELD PLATINUM PATCH ANGIOPLASTY;  Surgeon: Larina Earthly, MD;  Location: [facility] OR;  Service: Vascular;  Laterality: Left;   POLYPECTOMY  05/17/2017   Procedure: POLYPECTOMY;  Surgeon: Malissa Hippo, MD;  Location: [facility] ENDO SUITE;  Service: Endoscopy;;  colon    PROSTATE BIOPSY     SKIN CANCER EXCISION Right    neck   TONSILLECTOMY     VIDEO BRONCHOSCOPY WITH ENDOBRONCHIAL ULTRASOUND N/A 12/11/2022   Procedure: VIDEO BRONCHOSCOPY WITH ENDOBRONCHIAL ULTRASOUND;  Surgeon: Leslye Peer, MD;  Location: [facility] ENDOSCOPY;  Service: Pulmonary;  Laterality: N/A;    FAMILY HISTORY:  Family History  Problem Relation Age of Onset   Cancer Mother    Stroke Father    Heart disease Father    Diabetes Mellitus II Sister     SOCIAL HISTORY:  Social History   Tobacco Use   Smoking status: Every Day    Current packs/day: 2.00    Average packs/day: 2.0 packs/day for 76.7 years (153.5 ttl pk-yrs)    Types: Cigarettes    Start date: 04/03/1946   Smokeless tobacco: Never  Vaping Use   Vaping status:  Never Used  Substance Use Topics   Alcohol use: Not Currently    Alcohol/week: 35.0 standard drinks of alcohol    Types: 35 Cans of beer per week   Drug use: No    ALLERGIES:  Allergies  Allergen Reactions   Bupropion Hives    MEDICATIONS:  Current Outpatient Medications  Medication Sig Dispense Refill   aspirin 325 MG tablet Take 1 tablet (325 mg total) by mouth daily.     atorvastatin (LIPITOR) 10 MG tablet Take 10 mg by mouth daily.     bevacizumab (AVASTIN) 400 MG/16ML SOLN 1.25 mg every 8 (eight) weeks. Left eye     Cyanocobalamin (VITAMIN B-12) 2500 MCG SUBL Place 2,500 mcg under the tongue daily.     FLUoxetine (PROZAC) 20 MG capsule Take 20 mg by mouth daily.     latanoprost (XALATAN) 0.005 % ophthalmic solution Place 1  drop into both eyes at bedtime.     menthol-zinc oxide (GOLD BOND) powder Apply 1 application topically daily as needed (for itching).     Multiple Vitamins-Minerals (PRESERVISION AREDS 2) CAPS Take 1 capsule by mouth 2 (two) times daily.     naproxen sodium (ANAPROX) 220 MG tablet Take 440 mg by mouth 2 (two) times daily as needed (for pain).     tamsulosin (FLOMAX) 0.4 MG CAPS capsule TAKE 1 CAPSULE BY MOUTH DAILY 30 capsule 0   telmisartan (MICARDIS) 80 MG tablet Take 0.5 tablets (40 mg total) by mouth daily.     No current facility-administered medications for this encounter.    REVIEW OF SYSTEMS:  A 10+ POINT REVIEW OF SYSTEMS WAS OBTAINED including neurology, dermatology, psychiatry, cardiac, respiratory, lymph, extremities, GI, GU, musculoskeletal, constitutional, reproductive, HEENT. ***   PHYSICAL EXAM:  vitals were not taken for this visit.   General: Alert and oriented, in no acute distress HEENT: Head is normocephalic. Extraocular movements are intact. Oropharynx is clear. Neck: Neck is supple, no palpable cervical or supraclavicular lymphadenopathy. Heart: Regular in rate and rhythm with no murmurs, rubs, or gallops. Chest: Clear to auscultation bilaterally, with no rhonchi, wheezes, or rales. Abdomen: Soft, nontender, nondistended, with no rigidity or guarding. Extremities: No cyanosis or edema. Lymphatics: see Neck Exam Skin: No concerning lesions. Musculoskeletal: symmetric strength and muscle tone throughout. Neurologic: Cranial nerves II through XII are grossly intact. No obvious focalities. Speech is fluent. Coordination is intact. Psychiatric: Judgment and insight are intact. Affect is appropriate. ***  ECOG = ***  0 - Asymptomatic (Fully active, able to carry on all predisease activities without restriction)  1 - Symptomatic but completely ambulatory (Restricted in physically strenuous activity but ambulatory and able to carry out work of a light or sedentary  nature. For example, light housework, office work)  2 - Symptomatic, <50% in bed during the day (Ambulatory and capable of all self care but unable to carry out any work activities. Up and about more than 50% of waking hours)  3 - Symptomatic, >50% in bed, but not bedbound (Capable of only limited self-care, confined to bed or chair 50% or more of waking hours)  4 - Bedbound (Completely disabled. Cannot carry on any self-care. Totally confined to bed or chair)  5 - Death   Santiago Glad MM, Creech RH, Tormey DC, et al. [phone]). "Toxicity and response criteria of the [facility] Group". Am. Evlyn Clines. Oncol. 5 (6): 649-55  LABORATORY DATA:  Lab Results  Component Value Date   WBC 6.4 12/11/2022   HGB 14.7 12/11/2022  HCT 43.9 12/11/2022   MCV 94.4 12/11/2022   PLT 169 12/11/2022   NEUTROABS 2.4 09/04/2022   Lab Results  Component Value Date   NA 139 12/11/2022   K 4.0 12/11/2022   CL 108 12/11/2022   CO2 19 (L) 12/11/2022   GLUCOSE 96 12/11/2022   BUN 20 12/11/2022   CREATININE 1.05 12/11/2022   CALCIUM 9.2 12/11/2022      RADIOGRAPHY: DG Chest Port 1 View  Result Date: 12/11/2022 CLINICAL DATA:  [id number] S/P bronchoscopy with biopsy [id number]. EXAM: PORTABLE CHEST 1 VIEW COMPARISON:  Chest CT 11/27/2022. FINDINGS: Post bronchoscopy changes in the bilateral upper lobes. No pneumothorax. Unchanged scattered nodular opacities in both lungs. Stable cardiac and mediastinal contours. No pleural effusion. IMPRESSION: Post bronchoscopy changes in the bilateral upper lobes. No pneumothorax. Electronically Signed   By: Orvan Falconer M.D.   On: 12/11/2022 11:17   DG C-Arm 1-60 Min-No Report  Result Date: 12/11/2022 Fluoroscopy was utilized by the requesting physician.  No radiographic interpretation.   DG C-ARM BRONCHOSCOPY  Result Date: 12/11/2022 C-ARM BRONCHOSCOPY: Fluoroscopy was utilized by the requesting physician.  No radiographic interpretation.      IMPRESSION:  Bilateral lung cancers: Squamous cell carcinoma of the right upper lobe and left upper lobe   ***  Today, I talked to the patient and family about the findings and work-up thus far.  We discussed the natural history of *** and general treatment, highlighting the role of radiotherapy in the management.  We discussed the available radiation techniques, and focused on the details of logistics and delivery.  We reviewed the anticipated acute and late sequelae associated with radiation in this setting.  The patient was encouraged to ask questions that I answered to the best of my ability. *** A patient consent form was discussed and signed.  We retained a copy for our records.  The patient would like to proceed with radiation and will be scheduled for CT simulation.  PLAN: ***    *** minutes of total time was spent for this patient encounter, including preparation, face-to-face counseling with the patient and coordination of care, physical exam, and documentation of the encounter.   ------------------------------------------------  Billie Lade, PhD, MD  This document serves as a record of services personally performed by Antony Blackbird, MD. It was created on his behalf by Neena Rhymes, a trained medical scribe. The creation of this record is based on the scribe's personal observations and the provider's statements to them. This document has been checked and approved by the attending provider.

## 2023-01-01 ENCOUNTER — Ambulatory Visit
Admission: RE | Admit: 2023-01-01 | Discharge: 2023-01-01 | Disposition: A | Discharge status: Home / Self Care | Payer: Medicare Other | Source: Ambulatory Visit | Attending: Radiation Oncology | Admitting: Radiation Oncology

## 2023-01-01 ENCOUNTER — Encounter: Payer: Self-pay | Admitting: Radiation Oncology

## 2023-01-01 VITALS — BP 131/71 | HR 86 | Temp 97.3°F | Resp 18 | Ht 63.0 in | Wt 175.2 lb

## 2023-01-01 DIAGNOSIS — K5900 Constipation, unspecified: Secondary | ICD-10-CM | POA: Diagnosis not present

## 2023-01-01 DIAGNOSIS — Z85828 Personal history of other malignant neoplasm of skin: Secondary | ICD-10-CM | POA: Diagnosis not present

## 2023-01-01 DIAGNOSIS — Z79899 Other long term (current) drug therapy: Secondary | ICD-10-CM | POA: Diagnosis not present

## 2023-01-01 DIAGNOSIS — C3411 Malignant neoplasm of upper lobe, right bronchus or lung: Secondary | ICD-10-CM | POA: Diagnosis not present

## 2023-01-01 DIAGNOSIS — Z860101 Personal history of adenomatous and serrated colon polyps: Secondary | ICD-10-CM | POA: Insufficient documentation

## 2023-01-01 DIAGNOSIS — Z9221 Personal history of antineoplastic chemotherapy: Secondary | ICD-10-CM | POA: Insufficient documentation

## 2023-01-01 DIAGNOSIS — F17210 Nicotine dependence, cigarettes, uncomplicated: Secondary | ICD-10-CM | POA: Diagnosis not present

## 2023-01-01 DIAGNOSIS — J449 Chronic obstructive pulmonary disease, unspecified: Secondary | ICD-10-CM | POA: Insufficient documentation

## 2023-01-01 DIAGNOSIS — Z7982 Long term (current) use of aspirin: Secondary | ICD-10-CM | POA: Insufficient documentation

## 2023-01-01 DIAGNOSIS — M51369 Other intervertebral disc degeneration, lumbar region without mention of lumbar back pain or lower extremity pain: Secondary | ICD-10-CM | POA: Diagnosis not present

## 2023-01-01 DIAGNOSIS — C3412 Malignant neoplasm of upper lobe, left bronchus or lung: Secondary | ICD-10-CM | POA: Insufficient documentation

## 2023-01-01 DIAGNOSIS — C3491 Malignant neoplasm of unspecified part of right bronchus or lung: Secondary | ICD-10-CM

## 2023-01-01 DIAGNOSIS — Z923 Personal history of irradiation: Secondary | ICD-10-CM | POA: Insufficient documentation

## 2023-01-01 DIAGNOSIS — M5030 Other cervical disc degeneration, unspecified cervical region: Secondary | ICD-10-CM | POA: Diagnosis not present

## 2023-01-01 DIAGNOSIS — Z809 Family history of malignant neoplasm, unspecified: Secondary | ICD-10-CM | POA: Insufficient documentation

## 2023-01-01 DIAGNOSIS — Z8546 Personal history of malignant neoplasm of prostate: Secondary | ICD-10-CM | POA: Diagnosis not present

## 2023-01-01 DIAGNOSIS — E785 Hyperlipidemia, unspecified: Secondary | ICD-10-CM | POA: Diagnosis not present

## 2023-01-01 DIAGNOSIS — I10 Essential (primary) hypertension: Secondary | ICD-10-CM | POA: Diagnosis not present

## 2023-01-01 DIAGNOSIS — M129 Arthropathy, unspecified: Secondary | ICD-10-CM | POA: Diagnosis not present

## 2023-01-08 ENCOUNTER — Ambulatory Visit
Admission: RE | Admit: 2023-01-08 | Discharge: 2023-01-08 | Disposition: A | Discharge status: Home / Self Care | Payer: Medicare Other | Source: Ambulatory Visit | Attending: Radiation Oncology | Admitting: Radiation Oncology

## 2023-01-08 DIAGNOSIS — C3491 Malignant neoplasm of unspecified part of right bronchus or lung: Secondary | ICD-10-CM

## 2023-01-08 DIAGNOSIS — C3411 Malignant neoplasm of upper lobe, right bronchus or lung: Secondary | ICD-10-CM | POA: Diagnosis not present

## 2023-01-08 DIAGNOSIS — C3412 Malignant neoplasm of upper lobe, left bronchus or lung: Secondary | ICD-10-CM | POA: Diagnosis not present

## 2023-01-08 DIAGNOSIS — F17210 Nicotine dependence, cigarettes, uncomplicated: Secondary | ICD-10-CM | POA: Diagnosis not present

## 2023-01-09 ENCOUNTER — Telehealth: Payer: Self-pay | Admitting: Emergency Medicine

## 2023-01-09 NOTE — Telephone Encounter (Signed)
Start Radiation on 10/29 and nothing has change since the biopsy so pt cancelled his appointment. Will like a call for any other recommendation if needed.

## 2023-01-11 NOTE — Telephone Encounter (Signed)
Routing to Dr. Lamonte Sakai as an Eric Hinton

## 2023-01-14 LAB — FUNGUS CULTURE RESULT

## 2023-01-14 LAB — FUNGUS CULTURE WITH STAIN

## 2023-01-14 LAB — FUNGAL ORGANISM REFLEX

## 2023-01-14 NOTE — Telephone Encounter (Signed)
OK thank you 

## 2023-01-15 ENCOUNTER — Ambulatory Visit: Payer: Medicare Other | Admitting: Adult Health

## 2023-01-16 ENCOUNTER — Ambulatory Visit: Payer: Medicare Other | Admitting: Emergency Medicine

## 2023-01-17 DIAGNOSIS — F17210 Nicotine dependence, cigarettes, uncomplicated: Secondary | ICD-10-CM | POA: Diagnosis not present

## 2023-01-17 DIAGNOSIS — C3412 Malignant neoplasm of upper lobe, left bronchus or lung: Secondary | ICD-10-CM | POA: Diagnosis not present

## 2023-01-17 DIAGNOSIS — C3411 Malignant neoplasm of upper lobe, right bronchus or lung: Secondary | ICD-10-CM | POA: Diagnosis not present

## 2023-01-22 ENCOUNTER — Ambulatory Visit: Payer: Medicare Other

## 2023-01-22 ENCOUNTER — Other Ambulatory Visit: Payer: Self-pay

## 2023-01-22 ENCOUNTER — Ambulatory Visit
Admission: RE | Admit: 2023-01-22 | Discharge: 2023-01-22 | Disposition: A | Discharge status: Home / Self Care | Payer: Medicare Other | Source: Ambulatory Visit | Attending: Radiation Oncology | Admitting: Radiation Oncology

## 2023-01-22 DIAGNOSIS — C3411 Malignant neoplasm of upper lobe, right bronchus or lung: Secondary | ICD-10-CM | POA: Diagnosis not present

## 2023-01-22 DIAGNOSIS — C3412 Malignant neoplasm of upper lobe, left bronchus or lung: Secondary | ICD-10-CM | POA: Diagnosis not present

## 2023-01-22 DIAGNOSIS — C3491 Malignant neoplasm of unspecified part of right bronchus or lung: Secondary | ICD-10-CM

## 2023-01-22 LAB — RAD ONC ARIA SESSION SUMMARY
Course Elapsed Days: 0
Plan Fractions Treated to Date: 1
Plan Fractions Treated to Date: 1
Plan Fractions Treated to Date: 1
Plan Prescribed Dose Per Fraction: 18 Gy
Plan Prescribed Dose Per Fraction: 18 Gy
Plan Prescribed Dose Per Fraction: 18 Gy
Plan Total Fractions Prescribed: 3
Plan Total Fractions Prescribed: 3
Plan Total Fractions Prescribed: 3
Plan Total Prescribed Dose: 54 Gy
Plan Total Prescribed Dose: 54 Gy
Plan Total Prescribed Dose: 54 Gy
Reference Point Dosage Given to Date: 18 Gy
Reference Point Dosage Given to Date: 18 Gy
Reference Point Dosage Given to Date: 18 Gy
Reference Point Session Dosage Given: 18 Gy
Reference Point Session Dosage Given: 18 Gy
Reference Point Session Dosage Given: 18 Gy
Session Number: 1

## 2023-01-23 ENCOUNTER — Ambulatory Visit: Payer: Medicare Other

## 2023-01-24 ENCOUNTER — Other Ambulatory Visit: Payer: Self-pay

## 2023-01-24 ENCOUNTER — Ambulatory Visit
Admission: RE | Admit: 2023-01-24 | Discharge: 2023-01-24 | Disposition: A | Discharge status: Home / Self Care | Payer: Medicare Other | Source: Ambulatory Visit | Attending: Radiation Oncology | Admitting: Radiation Oncology

## 2023-01-24 DIAGNOSIS — C3491 Malignant neoplasm of unspecified part of right bronchus or lung: Secondary | ICD-10-CM

## 2023-01-24 DIAGNOSIS — C3411 Malignant neoplasm of upper lobe, right bronchus or lung: Secondary | ICD-10-CM | POA: Diagnosis not present

## 2023-01-24 DIAGNOSIS — C3412 Malignant neoplasm of upper lobe, left bronchus or lung: Secondary | ICD-10-CM | POA: Diagnosis not present

## 2023-01-24 LAB — RAD ONC ARIA SESSION SUMMARY
Course Elapsed Days: 2
Plan Fractions Treated to Date: 2
Plan Fractions Treated to Date: 2
Plan Fractions Treated to Date: 2
Plan Prescribed Dose Per Fraction: 18 Gy
Plan Prescribed Dose Per Fraction: 18 Gy
Plan Prescribed Dose Per Fraction: 18 Gy
Plan Total Fractions Prescribed: 3
Plan Total Fractions Prescribed: 3
Plan Total Fractions Prescribed: 3
Plan Total Prescribed Dose: 54 Gy
Plan Total Prescribed Dose: 54 Gy
Plan Total Prescribed Dose: 54 Gy
Reference Point Dosage Given to Date: 36 Gy
Reference Point Dosage Given to Date: 36 Gy
Reference Point Dosage Given to Date: 36 Gy
Reference Point Session Dosage Given: 18 Gy
Reference Point Session Dosage Given: 18 Gy
Reference Point Session Dosage Given: 18 Gy
Session Number: 2

## 2023-01-24 LAB — ACID FAST CULTURE WITH REFLEXED SENSITIVITIES (MYCOBACTERIA): Acid Fast Culture: NEGATIVE

## 2023-01-25 ENCOUNTER — Ambulatory Visit: Payer: Medicare Other

## 2023-01-28 ENCOUNTER — Ambulatory Visit
Admission: RE | Admit: 2023-01-28 | Discharge: 2023-01-28 | Disposition: A | Discharge status: Home / Self Care | Payer: Medicare Other | Source: Ambulatory Visit | Attending: Radiation Oncology | Admitting: Radiation Oncology

## 2023-01-28 ENCOUNTER — Other Ambulatory Visit: Payer: Self-pay

## 2023-01-28 DIAGNOSIS — C3411 Malignant neoplasm of upper lobe, right bronchus or lung: Secondary | ICD-10-CM | POA: Diagnosis not present

## 2023-01-28 DIAGNOSIS — C3412 Malignant neoplasm of upper lobe, left bronchus or lung: Secondary | ICD-10-CM | POA: Diagnosis not present

## 2023-01-28 DIAGNOSIS — F17210 Nicotine dependence, cigarettes, uncomplicated: Secondary | ICD-10-CM | POA: Diagnosis not present

## 2023-01-28 DIAGNOSIS — Z510 Encounter for antineoplastic radiation therapy: Secondary | ICD-10-CM | POA: Diagnosis not present

## 2023-01-28 DIAGNOSIS — C3491 Malignant neoplasm of unspecified part of right bronchus or lung: Secondary | ICD-10-CM

## 2023-01-28 LAB — RAD ONC ARIA SESSION SUMMARY
Course Elapsed Days: 6
Plan Fractions Treated to Date: 3
Plan Fractions Treated to Date: 3
Plan Fractions Treated to Date: 3
Plan Prescribed Dose Per Fraction: 18 Gy
Plan Prescribed Dose Per Fraction: 18 Gy
Plan Prescribed Dose Per Fraction: 18 Gy
Plan Total Fractions Prescribed: 3
Plan Total Fractions Prescribed: 3
Plan Total Fractions Prescribed: 3
Plan Total Prescribed Dose: 54 Gy
Plan Total Prescribed Dose: 54 Gy
Plan Total Prescribed Dose: 54 Gy
Reference Point Dosage Given to Date: 54 Gy
Reference Point Dosage Given to Date: 54 Gy
Reference Point Dosage Given to Date: 54 Gy
Reference Point Session Dosage Given: 18 Gy
Reference Point Session Dosage Given: 18 Gy
Reference Point Session Dosage Given: 18 Gy
Session Number: 3

## 2023-01-29 NOTE — Radiation Completion Notes (Signed)
Patient Name: Eric Hinton, FAWVER MRN: [id number] Date of Birth: 05-07-1940 Referring Physician: Doreatha Massed, M.D. Date of Service: 2023-01-29 Radiation Oncologist: Arnette Schaumann, M.D. [facility]                             RADIATION ONCOLOGY END OF TREATMENT NOTE     Diagnosis: C34.12 Malignant neoplasm of upper lobe, left bronchus or lung; C34.11 Malignant neoplasm of upper lobe, right bronchus or lung Staging on 2015-07-15: Squamous cell carcinoma of right lung (HCC) T=TX, N=N2, M=M0 Intent: Curative     ==========DELIVERED PLANS==========  First Treatment Date: 2023-01-22 - Last Treatment Date: 2023-01-28   Plan Name: Lung_LUL_SBRT Site: Lung, Left Technique: SBRT/SRT-IMRT Mode: Photon Dose Per Fraction: 18 Gy Prescribed Dose (Delivered / Prescribed): 54 Gy / 54 Gy Prescribed Fxs (Delivered / Prescribed): 3 / 3   Plan Name: Lung_RUL1_SBR Site: Lung, Right Technique: SBRT/SRT-IMRT Mode: Photon Dose Per Fraction: 18 Gy Prescribed Dose (Delivered / Prescribed): 54 Gy / 54 Gy Prescribed Fxs (Delivered / Prescribed): 3 / 3   Plan Name: Lung_RUL2_SBR Site: Lung, Right Technique: SBRT/SRT-IMRT Mode: Photon Dose Per Fraction: 18 Gy Prescribed Dose (Delivered / Prescribed): 54 Gy / 54 Gy Prescribed Fxs (Delivered / Prescribed): 3 / 3     ==========ON TREATMENT VISIT DATES========== 2023-01-22, 2023-01-22, 2023-01-22, 2023-01-24, 2023-01-24, 2023-01-24, 2023-01-28, 2023-01-28, 2023-01-28, 2023-01-28     ==========UPCOMING VISITS==========       ==========APPENDIX - ON TREATMENT VISIT NOTES==========   See weekly On Treatment Notes in Epic for details.

## 2023-01-30 ENCOUNTER — Ambulatory Visit: Payer: Medicare Other | Admitting: Radiation Oncology

## 2023-02-01 ENCOUNTER — Ambulatory Visit: Payer: Medicare Other | Admitting: Radiation Oncology

## 2023-02-12 DIAGNOSIS — R7301 Impaired fasting glucose: Secondary | ICD-10-CM | POA: Diagnosis not present

## 2023-02-12 DIAGNOSIS — E782 Mixed hyperlipidemia: Secondary | ICD-10-CM | POA: Diagnosis not present

## 2023-02-19 DIAGNOSIS — R7301 Impaired fasting glucose: Secondary | ICD-10-CM | POA: Diagnosis not present

## 2023-02-19 DIAGNOSIS — I998 Other disorder of circulatory system: Secondary | ICD-10-CM | POA: Diagnosis not present

## 2023-02-19 DIAGNOSIS — I2510 Atherosclerotic heart disease of native coronary artery without angina pectoris: Secondary | ICD-10-CM | POA: Diagnosis not present

## 2023-02-19 DIAGNOSIS — H3530 Unspecified macular degeneration: Secondary | ICD-10-CM | POA: Diagnosis not present

## 2023-02-19 DIAGNOSIS — J449 Chronic obstructive pulmonary disease, unspecified: Secondary | ICD-10-CM | POA: Diagnosis not present

## 2023-02-19 DIAGNOSIS — R944 Abnormal results of kidney function studies: Secondary | ICD-10-CM | POA: Diagnosis not present

## 2023-02-19 DIAGNOSIS — Z8546 Personal history of malignant neoplasm of prostate: Secondary | ICD-10-CM | POA: Diagnosis not present

## 2023-02-19 DIAGNOSIS — Z85828 Personal history of other malignant neoplasm of skin: Secondary | ICD-10-CM | POA: Diagnosis not present

## 2023-02-19 DIAGNOSIS — I10 Essential (primary) hypertension: Secondary | ICD-10-CM | POA: Diagnosis not present

## 2023-02-19 DIAGNOSIS — Z720 Tobacco use: Secondary | ICD-10-CM | POA: Diagnosis not present

## 2023-02-19 DIAGNOSIS — E782 Mixed hyperlipidemia: Secondary | ICD-10-CM | POA: Diagnosis not present

## 2023-02-19 DIAGNOSIS — Z85118 Personal history of other malignant neoplasm of bronchus and lung: Secondary | ICD-10-CM | POA: Diagnosis not present

## 2023-03-01 NOTE — Progress Notes (Signed)
  Radiation Oncology         [phone] ________________________________  Name: Eric Hinton MRN: [id number]  Date: 03/04/2023  DOB: Aug 15, 1940  End of Treatment Note  Diagnosis:  The encounter diagnosis was Squamous cell carcinoma of right lung (HCC).   Bilateral lung cancers: Squamous cell carcinoma of the right upper lobe and left upper lobe    History of squamous cell carcinoma of right lung diagnosed in 2013, s/p concurrent chemoradiation    History of prostate cancer also diagnosed in 2013, s/p Calypso treatment     Indication for treatment: Curative        Radiation treatment dates: First Treatment Date: 2023-01-22 - Last Treatment Date: 2023-01-28  Site/Dose/Technique/Mode:   Site: Left upper lung  Technique: SBRT/SRT-IMRT Mode: Photon Dose Per Fraction: 18 Gy Prescribed Dose (Delivered / Prescribed): 54 Gy / 54 Gy Prescribed Fxs (Delivered / Prescribed): 3 / 3   Site: Right upper lung - target 1  Technique: SBRT/SRT-IMRT Mode: Photon Dose Per Fraction: 18 Gy Prescribed Dose (Delivered / Prescribed): 54 Gy / 54 Gy Prescribed Fxs (Delivered / Prescribed): 3 / 3   Site: Right upper lung - target 2  Technique: SBRT/SRT-IMRT Mode: Photon Dose Per Fraction: 18 Gy Prescribed Dose (Delivered / Prescribed): 54 Gy / 54 Gy Prescribed Fxs (Delivered / Prescribed): 3 / 3  Narrative: The patient tolerated radiation treatment relatively well without any significant side effects.  On the date of his final treatment, the patient endorsed fatigue and mild skin irritation.  Plan: The patient has completed radiation treatment. The patient will return to radiation oncology clinic for routine followup in one month. I advised them to call or return sooner if they have any questions or concerns related to their recovery or treatment.  -----------------------------------  Billie Lade, PhD, MD  This document serves as a record of services personally performed by Antony Blackbird, MD. It was created on his behalf by Neena Rhymes, a trained medical scribe. The creation of this record is based on the scribe's personal observations and the provider's statements to them. This document has been checked and approved by the attending provider.

## 2023-03-03 NOTE — Progress Notes (Signed)
Radiation Oncology         [phone] ________________________________  Name: Eric Hinton MRN: [id number]  Date: 03/04/2023  DOB: 14-Dec-1940  Follow-Up Visit Note  CC: Benita Stabile, MD  Doreatha Massed, MD  No diagnosis found.  Diagnosis: Bilateral lung cancers: Squamous cell carcinoma of the right upper lobe and left upper lobe    History of squamous cell carcinoma of right lung diagnosed in 2013, s/p concurrent chemoradiation    History of prostate cancer also diagnosed in 2013, s/p Calypso treatment     Interval Since Last Radiation: 1 month and 5 days   Indication for treatment: Curative         Radiation treatment dates: First Treatment Date: 2023-01-22 - Last Treatment Date: 2023-01-28   Site/Dose/Technique/Mode:    Site: Left upper lung  Technique: SBRT/SRT-IMRT Mode: Photon Dose Per Fraction: 18 Gy Prescribed Dose (Delivered / Prescribed): 54 Gy / 54 Gy Prescribed Fxs (Delivered / Prescribed): 3 / 3   Site: Right upper lung - target 1  Technique: SBRT/SRT-IMRT Mode: Photon Dose Per Fraction: 18 Gy Prescribed Dose (Delivered / Prescribed): 54 Gy / 54 Gy Prescribed Fxs (Delivered / Prescribed): 3 / 3   Site: Right upper lung - target 2  Technique: SBRT/SRT-IMRT Mode: Photon Dose Per Fraction: 18 Gy Prescribed Dose (Delivered / Prescribed): 54 Gy / 54 Gy Prescribed Fxs (Delivered / Prescribed): 3 / 3  Narrative:  The patient returns today for routine follow-up. He tolerated radiation treatment relatively well without any significant side effects.  On the date of his final treatment, the patient endorsed fatigue and mild skin irritation.     No other significant oncologic interval history since the patient completed radiation therapy or in the interval since his initial consultation date.        ***                       Allergies:  is allergic to bupropion.  Meds: Current Outpatient Medications  Medication Sig Dispense Refill   albuterol  (VENTOLIN HFA) 108 (90 Base) MCG/ACT inhaler Inhale 1 puff into the lungs every 6 (six) hours as needed.     aspirin 325 MG tablet Take 1 tablet (325 mg total) by mouth daily.     atorvastatin (LIPITOR) 10 MG tablet Take 10 mg by mouth daily.     bevacizumab (AVASTIN) 400 MG/16ML SOLN 1.25 mg every 8 (eight) weeks. Left eye     Cyanocobalamin (VITAMIN B-12) 2500 MCG SUBL Place 2,500 mcg under the tongue daily. (Patient not taking: Reported on 01/01/2023)     FLUoxetine (PROZAC) 20 MG capsule Take 20 mg by mouth daily.     latanoprost (XALATAN) 0.005 % ophthalmic solution Place 1 drop into both eyes at bedtime.     menthol-zinc oxide (GOLD BOND) powder Apply 1 application topically daily as needed (for itching).     Multiple Vitamins-Minerals (PRESERVISION AREDS 2) CAPS Take 1 capsule by mouth 2 (two) times daily.     naproxen sodium (ANAPROX) 220 MG tablet Take 440 mg by mouth 2 (two) times daily as needed (for pain). (Patient not taking: Reported on 01/01/2023)     tamsulosin (FLOMAX) 0.4 MG CAPS capsule TAKE 1 CAPSULE BY MOUTH DAILY 30 capsule 0   telmisartan (MICARDIS) 80 MG tablet Take 0.5 tablets (40 mg total) by mouth daily.     No current facility-administered medications for this encounter.    Physical Findings: The patient is in  no acute distress. Patient is alert and oriented.  vitals were not taken for this visit. .  No significant changes. Lungs are clear to auscultation bilaterally. Heart has regular rate and rhythm. No palpable cervical, supraclavicular, or axillary adenopathy. Abdomen soft, non-tender, normal bowel sounds.   Lab Findings: Lab Results  Component Value Date   WBC 6.4 12/11/2022   HGB 14.7 12/11/2022   HCT 43.9 12/11/2022   MCV 94.4 12/11/2022   PLT 169 12/11/2022    Radiographic Findings: No results found.  Impression: Bilateral lung cancers: Squamous cell carcinoma of the right upper lobe and left upper lobe    History of squamous cell carcinoma of  right lung diagnosed in 2013, s/p concurrent chemoradiation    History of prostate cancer also diagnosed in 2013, s/p Calypso treatment     The patient is recovering from the effects of radiation.  ***  Plan:  ***   *** minutes of total time was spent for this patient encounter, including preparation, face-to-face counseling with the patient and coordination of care, physical exam, and documentation of the encounter. ____________________________________  Billie Lade, PhD, MD  This document serves as a record of services personally performed by Antony Blackbird, MD. It was created on his behalf by Neena Rhymes, a trained medical scribe. The creation of this record is based on the scribe's personal observations and the provider's statements to them. This document has been checked and approved by the attending provider.

## 2023-03-04 ENCOUNTER — Ambulatory Visit
Admission: RE | Admit: 2023-03-04 | Discharge: 2023-03-04 | Disposition: A | Discharge status: Home / Self Care | Payer: Medicare Other | Source: Ambulatory Visit | Attending: Radiation Oncology | Admitting: Radiation Oncology

## 2023-03-04 ENCOUNTER — Encounter: Payer: Self-pay | Admitting: Radiation Oncology

## 2023-03-04 DIAGNOSIS — Z79899 Other long term (current) drug therapy: Secondary | ICD-10-CM | POA: Diagnosis not present

## 2023-03-04 DIAGNOSIS — Z923 Personal history of irradiation: Secondary | ICD-10-CM | POA: Diagnosis not present

## 2023-03-04 DIAGNOSIS — Z8546 Personal history of malignant neoplasm of prostate: Secondary | ICD-10-CM | POA: Diagnosis not present

## 2023-03-04 DIAGNOSIS — C3411 Malignant neoplasm of upper lobe, right bronchus or lung: Secondary | ICD-10-CM | POA: Insufficient documentation

## 2023-03-04 DIAGNOSIS — Z7982 Long term (current) use of aspirin: Secondary | ICD-10-CM | POA: Insufficient documentation

## 2023-03-04 DIAGNOSIS — L598 Other specified disorders of the skin and subcutaneous tissue related to radiation: Secondary | ICD-10-CM | POA: Diagnosis not present

## 2023-03-04 DIAGNOSIS — C3412 Malignant neoplasm of upper lobe, left bronchus or lung: Secondary | ICD-10-CM | POA: Insufficient documentation

## 2023-03-04 DIAGNOSIS — C3491 Malignant neoplasm of unspecified part of right bronchus or lung: Secondary | ICD-10-CM

## 2023-03-04 NOTE — Progress Notes (Signed)
Eric Hinton is here today for follow up post radiation to the lung.  Lung Side: Bilateral lung  Does the patient complain of any of the following: Pain:Denies pain Shortness of breath w/wo exertion: NO Cough: He reports having COPD Hemoptysis: No Pain with swallowing: No Swallowing/choking concerns: No Appetite: Good  Energy Level: Poor Post radiation skin Changes: He reports some spots on chest and back that were "itchy".  BP (!) (P) 118/59   Pulse (P) 86   Temp (P) 97.7 F (36.5 C)   Resp (P) 18   Wt (P) 172 lb 6.4 oz (78.2 kg)   SpO2 (P) 98%   BMI (P) 30.54 kg/m

## 2023-04-16 ENCOUNTER — Inpatient Hospital Stay: Payer: Medicare Other | Attending: Hematology

## 2023-04-16 DIAGNOSIS — C3491 Malignant neoplasm of unspecified part of right bronchus or lung: Secondary | ICD-10-CM

## 2023-04-16 DIAGNOSIS — Z85118 Personal history of other malignant neoplasm of bronchus and lung: Secondary | ICD-10-CM | POA: Insufficient documentation

## 2023-04-16 DIAGNOSIS — Z8546 Personal history of malignant neoplasm of prostate: Secondary | ICD-10-CM | POA: Diagnosis not present

## 2023-04-16 LAB — CBC WITH DIFFERENTIAL/PLATELET
Abs Immature Granulocytes: 0.02 10*3/uL (ref 0.00–0.07)
Basophils Absolute: 0 10*3/uL (ref 0.0–0.1)
Basophils Relative: 1 %
Eosinophils Absolute: 0.1 10*3/uL (ref 0.0–0.5)
Eosinophils Relative: 2 %
HCT: 43.8 % (ref 39.0–52.0)
Hemoglobin: 14.9 g/dL (ref 13.0–17.0)
Immature Granulocytes: 0 %
Lymphocytes Relative: 26 %
Lymphs Abs: 1.6 10*3/uL (ref 0.7–4.0)
MCH: 32.1 pg (ref 26.0–34.0)
MCHC: 34 g/dL (ref 30.0–36.0)
MCV: 94.4 fL (ref 80.0–100.0)
Monocytes Absolute: 0.6 10*3/uL (ref 0.1–1.0)
Monocytes Relative: 9 %
Neutro Abs: 3.9 10*3/uL (ref 1.7–7.7)
Neutrophils Relative %: 62 %
Platelets: 195 10*3/uL (ref 150–400)
RBC: 4.64 MIL/uL (ref 4.22–5.81)
RDW: 12.6 % (ref 11.5–15.5)
WBC: 6.2 10*3/uL (ref 4.0–10.5)
nRBC: 0 % (ref 0.0–0.2)

## 2023-04-16 LAB — COMPREHENSIVE METABOLIC PANEL
ALT: 16 U/L (ref 0–44)
AST: 14 U/L — ABNORMAL LOW (ref 15–41)
Albumin: 4 g/dL (ref 3.5–5.0)
Alkaline Phosphatase: 72 U/L (ref 38–126)
Anion gap: 9 (ref 5–15)
BUN: 18 mg/dL (ref 8–23)
CO2: 25 mmol/L (ref 22–32)
Calcium: 9.3 mg/dL (ref 8.9–10.3)
Chloride: 102 mmol/L (ref 98–111)
Creatinine, Ser: 1.05 mg/dL (ref 0.61–1.24)
GFR, Estimated: >60 mL/min (ref >60)
Glucose, Bld: 102 mg/dL — ABNORMAL HIGH (ref 70–99)
Potassium: 4.4 mmol/L (ref 3.5–5.1)
Sodium: 136 mmol/L (ref 135–145)
Total Bilirubin: 0.7 mg/dL (ref 0.0–1.2)
Total Protein: 7.1 g/dL (ref 6.5–8.1)

## 2023-04-23 ENCOUNTER — Inpatient Hospital Stay: Payer: Medicare Other

## 2023-04-23 ENCOUNTER — Ambulatory Visit (HOSPITAL_COMMUNITY)
Admission: RE | Admit: 2023-04-23 | Discharge: 2023-04-23 | Disposition: A | Discharge status: Home / Self Care | Payer: Medicare Other | Source: Ambulatory Visit | Attending: Hematology | Admitting: Hematology

## 2023-04-23 DIAGNOSIS — C3491 Malignant neoplasm of unspecified part of right bronchus or lung: Secondary | ICD-10-CM | POA: Diagnosis not present

## 2023-04-23 DIAGNOSIS — I3139 Other pericardial effusion (noninflammatory): Secondary | ICD-10-CM | POA: Diagnosis not present

## 2023-04-23 DIAGNOSIS — I700 Atherosclerosis of aorta: Secondary | ICD-10-CM | POA: Diagnosis not present

## 2023-04-23 DIAGNOSIS — C3411 Malignant neoplasm of upper lobe, right bronchus or lung: Secondary | ICD-10-CM | POA: Diagnosis not present

## 2023-04-23 MED ORDER — IOHEXOL 300 MG/ML  SOLN
75.0000 mL | Freq: Once | INTRAMUSCULAR | Status: AC | PRN
  Administered 2023-04-23: 75 mL via INTRAVENOUS

## 2023-04-24 ENCOUNTER — Other Ambulatory Visit: Payer: Medicare Other

## 2023-04-24 ENCOUNTER — Ambulatory Visit (HOSPITAL_COMMUNITY): Payer: Medicare Other

## 2023-04-30 ENCOUNTER — Inpatient Hospital Stay: Payer: Medicare Other | Attending: Hematology | Admitting: Hematology

## 2023-04-30 VITALS — BP 119/63 | HR 86 | Temp 96.6°F | Resp 18 | Wt 174.4 lb

## 2023-04-30 DIAGNOSIS — Z8546 Personal history of malignant neoplasm of prostate: Secondary | ICD-10-CM | POA: Diagnosis not present

## 2023-04-30 DIAGNOSIS — C3491 Malignant neoplasm of unspecified part of right bronchus or lung: Secondary | ICD-10-CM

## 2023-04-30 DIAGNOSIS — Z85118 Personal history of other malignant neoplasm of bronchus and lung: Secondary | ICD-10-CM | POA: Diagnosis not present

## 2023-04-30 DIAGNOSIS — Z08 Encounter for follow-up examination after completed treatment for malignant neoplasm: Secondary | ICD-10-CM | POA: Insufficient documentation

## 2023-04-30 NOTE — Progress Notes (Signed)
 [facility] [address] [place] [address]    Clinic Day:  04/30/23   Referring physician: [name], MD  Patient Care Team: [name], MD as PCP - General (Internal Medicine) Rogers Hai, MD as Medical Oncologist (Medical Oncology)   ASSESSMENT & PLAN:   Assessment: 1. Stage IIIa (T1 aN2 M0) squamous cell carcinoma of right lung: - Diagnosed in #2013, status post concurrent chemoradiation therapy with carboplatin and paclitaxel weekly, treated in [facility], [place] . - He is being followed by Dr. Sherrod in [place] once a year with scans. - Last CT chest with contrast on 02/20/2021 did not show any evidence of recurrence. - PET scan (09/20/2022): Left upper lobe 1.2 cm nodule hypermetabolic with SUV 2.6.  Mixed attenuation posterior right upper lobe nodule mildly hypermetabolic at 11 mm, SUV 1.8.  Enlarging right hilar lymph node 1.4 cm with SUV 3.5. - He underwent bronchoscopy and biopsy on 12/11/2022. - Lymph node station 7 FNA was benign.  11R FNA was benign. - LUL FNA consistent with squamous cell carcinoma.  RUL FNA was consistent with squamous cell carcinoma.  RUL target 3 lesion was benign. - SBRT to the left upper lobe lung, right upper lung 2 lesions from 01/22/2023 through 01/28/2023  2. Social/family history: - He is recently widowed and lives by himself. He is legally blind. He is retired from [employer] and also worked as a audiological scientist at [employer]. - Current active smoker, smokes 2 to 3 packs/day for the last 74 years. - Mother had cancer in her abdomen.  Father and 4 paternal uncles had prostate cancer.  3.  Prostate cancer: - Diagnosed in 2013 and had seed implant and Calypso treatment with 5 treatments of radiation.  He reports that his most recent PSA was undetectable.    Plan: 1.  Squamous cell carcinoma of the right upper lobe of the lung and left upper lobe of the lung: - He has completed  SBRT to the left upper lobe lung lesion and to right upper lobe lung lesions on 01/28/2023 and tolerated well. - Denied any recent lung infections. - Reviewed labs from 04/15/2020 echo: Normal LFTs and CBC. - CT chest (04/23/2023): Reviewed by me shows new 7 x 14 mm nodule in the right upper lobe concerning for local recurrence.  2 other small 5 mm or smaller lesions in the right lower lobe and left lower lobe which will be monitored. - Based on the new findings, I have recommended nonurgent PET/CT scan which will be done locally.  We will see him back after the PET scan.    2.  Prostate cancer: - I have previously recommended genetics evaluation because of personal and family history.    Orders Placed This Encounter  Procedures   NM PET Image Restag (PS) Skull Base To Thigh    Standing Status:   Future    Expected Date:   05/14/2023    Expiration Date:   04/29/2024    If indicated for the ordered procedure, I authorize the administration of a radiopharmaceutical per Radiology protocol:   Yes    Preferred imaging location?:   [name]      I,Helena R Teague,acting as a scribe for Hai Rogers, MD.,have documented all relevant documentation on the behalf of Hai Rogers, MD,as directed by  Hai Rogers, MD while in the presence of Hai Rogers, MD.  I, Hai Rogers MD, have reviewed the above documentation for  accuracy and completeness, and I agree with the above.     Alean Stands, MD   2/4/20253:02 PM  CHIEF COMPLAINT:   Diagnosis: right lung squamous cell carcinoma    Cancer Staging  Squamous cell carcinoma of right lung (HCC) Staging form: Lung, AJCC 7th Edition - Clinical: Stage Unknown (TX, N2, M0) - Signed by Berry Debby RAMAN, PA-C on 07/15/2015    Prior Therapy: chemoradiation therapy   Current Therapy:  surveillance    HISTORY OF PRESENT ILLNESS:   Oncology History Overview Note  H/O Stage IIIA poorly differentiated  squamous cell carcinoma of right lung (TXN2M0) treated with concurrent chemoradiation consisting of weekly Carbo/Taxo (02/28/2012- 04/17/2012) complicated by hospitalizations (2) and issues with thrombocytopenia resulting in completing 6/8 cycles of chemotherapy.  He underwent XRT from 02/25/2012- 04/29/2012.   Squamous cell carcinoma of right lung (HCC)  12/03/2011 PET scan   Persistent focal uptake in the rectoanal bowel, benign inflammatory possible neoplastic process cannot be excluded. Multiple enlarging hypermetabolic mediastinal lymph nodes concerning for malignancy. RML density favoring benign etiology   12/13/2011 Procedure   Colonoscopy found tubular adenomas at 75 cm and 40 cm   01/14/2012 Imaging   CT chest-no significant interval change in mediastinal lymphadenopathy when compared to 11/2011.  Spiculated opacity in the right upper lobe not significantly changed from 11/2011. Stable RML noncalcified on her nodules. Diffuse emphysema.   02/08/2012 Procedure   Bronchoscopy revealing chronic bronchitis, multiple mucous plugs are present and lavaged out from the lung. Needle aspiration of mediastinal lymph node performed.   02/08/2012 Pathology Results   Needle aspiration of mediastinal lymph node-Poorly differentiated squamous cell carcinoma   02/19/2012 Imaging   MRI brain-negative   02/19/2012 Cancer Staging   Clinically Stage IIIA   02/25/2012 - 04/29/2012 Radiation Therapy   Dr. Chuckie   02/28/2012 - 04/17/2012 Chemotherapy   Weekly Carbo/Taxol requiring dose reduction due to thrombocytopenia/hospitalization.   03/27/2012 Treatment Plan Change   Treatment held due to thrombocytopenia/hospitalization   04/10/2012 Treatment Plan Change   Treatment held due to thrombocytopenia/hospitalization   04/17/2012 Treatment Plan Change   Patient refused any further chemotherapy.   05/19/2015 Imaging   CT chest- Mild interval increase in the prominence of chronic irregular densities in the  anterior right mid lung. Most compatible with areas of scarring and atelectasis. Recommend CT follow-up in 3-6 months. Mildly prominent right hilar lymph nodes.   08/17/2015 Imaging   CT chest- Areas of architectural distortion, bronchiectasis + consolidation in the RUL + RML are presumably treatment related. Given absence of a recent prior comparison examination, it is difficult to definitively exclude residual disease.      INTERVAL HISTORY:   Eric Hinton is a 83 y.o. male presenting to clinic today for follow up of right lung squamous cell carcinoma. He was last seen by me on 12/25/22.  Since his last visit, he underwent CT chest on 04/23/23 that found: a new 7 x 14 mm opacity in the right upper lobe, which is highly concerning for local recurrent tumor, and two 5 mm or smaller opacities in the right lower lobe and left lower lobe.  He finished SBRT treatment 3 months ago.  He denies any side effects from radiation.  Today, he states that he is doing well overall. His appetite level is at 100%. His energy level is at 25%. He denies any recent respiratory infections. [facility] and would like a copy of the CT results.  PAST MEDICAL HISTORY:   Past Medical History: Past Medical History:  Diagnosis Date   Arthritis    Carotid artery disease (HCC)    Status post bilateral CEA - Dr. Oris   Complication of anesthesia    unable to void afterwards   Constipation    COPD (chronic obstructive pulmonary disease) (HCC)    DDD (degenerative disc disease), cervical    Degenerative disc disease, lumbar    Essential hypertension    Hyperlipidemia    Macular degeneration    Prostate cancer (HCC)    Skin cancer, basal cell    Squamous cell lung cancer (HCC) 07/15/2015   Status post chemotherapy and XRT - Dr. Sherrod   Tubular adenoma of colon     Surgical History: Past Surgical History:  Procedure Laterality Date   BRONCHIAL BIOPSY  12/11/2022   Procedure:  BRONCHIAL BIOPSIES;  Surgeon: Shelah Lamar RAMAN, MD;  Location[facility] ENDOSCOPY;  Service: Pulmonary;;   BRONCHIAL BRUSHINGS  12/11/2022   Procedure: BRONCHIAL BRUSHINGS;  Surgeon: Shelah Lamar RAMAN, MD;  Location: [facility] ENDOSCOPY;  Service: Pulmonary;;   BRONCHIAL NEEDLE ASPIRATION BIOPSY  12/11/2022   Procedure: BRONCHIAL NEEDLE ASPIRATION BIOPSIES;  Surgeon: Shelah Lamar RAMAN, MD;  Location: [facility] ENDOSCOPY;  Service: Pulmonary;;   BRONCHIAL WASHINGS  12/11/2022   Procedure: BRONCHIAL WASHINGS;  Surgeon: Shelah Lamar RAMAN, MD;  Location: [facility] ENDOSCOPY;  Service: Pulmonary;;   CATARACT EXTRACTION W/ INTRAOCULAR LENS  IMPLANT, BILATERAL Bilateral    COLONOSCOPY     COLONOSCOPY WITH PROPOFOL  N/A 05/17/2017   Procedure: COLONOSCOPY WITH PROPOFOL ;  Surgeon: Golda Claudis PENNER, MD;  Location: [facility] ENDO SUITE;  Service: Endoscopy;  Laterality: N/A;  10:15   ENDARTERECTOMY Right 08/29/2015   Procedure: RIGHT CAROTID ENDARTERECTOMY WITH PATCH ANGIOPLASTY;  Surgeon: Krystal JULIANNA Oris, MD;  Location: [facility] OR;  Service: Vascular;  Laterality: Right;   ENDARTERECTOMY Left 10/07/2015   Procedure: LEFT CAROTID ARTERY ENDARTERECTOMY;  Surgeon: Krystal JULIANNA Oris, MD;  Location: [facility] OR;  Service: Vascular;  Laterality: Left;   FIDUCIAL MARKER PLACEMENT  12/11/2022   Procedure: FIDUCIAL MARKER PLACEMENT;  Surgeon: Shelah Lamar RAMAN, MD;  Location: [facility] ENDOSCOPY;  Service: Pulmonary;;   FINE NEEDLE ASPIRATION  12/11/2022   Procedure: FINE NEEDLE ASPIRATION;  Surgeon: Shelah Lamar RAMAN, MD;  Location: [facility] ENDOSCOPY;  Service: Pulmonary;;   NOSE SURGERY     rebulit my nose; related to skin cancer   PATCH ANGIOPLASTY Left 10/07/2015   Procedure: With HEMASHIELD PLATINUM PATCH ANGIOPLASTY;  Surgeon: Krystal JULIANNA Oris, MD;  Location: [facility] OR;  Service: Vascular;  Laterality: Left;   POLYPECTOMY  05/17/2017   Procedure: POLYPECTOMY;  Surgeon: Golda Claudis PENNER, MD;  Location: [facility] ENDO SUITE;  Service: Endoscopy;;  colon    PROSTATE BIOPSY     SKIN CANCER EXCISION Right     neck   TONSILLECTOMY     VIDEO BRONCHOSCOPY WITH ENDOBRONCHIAL ULTRASOUND N/A 12/11/2022   Procedure: VIDEO BRONCHOSCOPY WITH ENDOBRONCHIAL ULTRASOUND;  Surgeon: Shelah Lamar RAMAN, MD;  Location: [facility] ENDOSCOPY;  Service: Pulmonary;  Laterality: N/A;    Social History: Social History   Socioeconomic History   Marital status: Married    Spouse name: Not on file   Number of children: Not on file   Years of education: Not on file   Highest education level: Not on file  Occupational History   Not on file  Tobacco Use   Smoking status: Every Day    Current packs/day: 2.00    Average packs/day:  2.0 packs/day for 77.1 years (154.1 ttl pk-yrs)    Types: Cigarettes    Start date: [birth year]/1946   Smokeless tobacco: Never  Vaping Use   Vaping status: Never Used  Substance and Sexual Activity   Alcohol use: Not Currently    Alcohol/week: 35.0 standard drinks of alcohol    Types: 35 Cans of beer per week   Drug use: No   Sexual activity: Not on file  Other Topics Concern   Not on file  Social History Narrative   Not on file   Social Drivers of Health   Financial Resource Strain: Not on file  Food Insecurity: No Food Insecurity (01/01/2023)   Hunger Vital Sign    Worried About Running Out of Food in the Last Year: Never true    Ran Out of Food in the Last Year: Never true  Transportation Needs: No Transportation Needs (01/01/2023)   PRAPARE - Administrator, Civil Service (Medical): No    Lack of Transportation (Non-Medical): No  Physical Activity: Not on file  Stress: Not on file  Social Connections: Not on file  Intimate Partner Violence: Not At Risk (01/01/2023)   Humiliation, Afraid, Rape, and Kick questionnaire    Fear of Current or Ex-Partner: No    Emotionally Abused: No    Physically Abused: No    Sexually Abused: No    Family History: Family History  Problem Relation Age of Onset   Cancer Mother    Stroke Father    Heart disease Father    Diabetes Mellitus  II Sister     Current Medications:  Current Outpatient Medications:    albuterol (VENTOLIN HFA) 108 (90 Base) MCG/ACT inhaler, Inhale 1 puff into the lungs every 6 (six) hours as needed., Disp: , Rfl:    aspirin  325 MG tablet, Take 1 tablet (325 mg total) by mouth daily., Disp: , Rfl:    atorvastatin  (LIPITOR) 10 MG tablet, Take 10 mg by mouth daily., Disp: , Rfl:    bevacizumab  (AVASTIN ) 400 MG/16ML SOLN, 1.25 mg every 8 (eight) weeks. Left eye, Disp: , Rfl:    Cyanocobalamin  (VITAMIN B-12) 2500 MCG SUBL, Place 2,500 mcg under the tongue daily., Disp: , Rfl:    FLUoxetine (PROZAC) 20 MG capsule, Take 20 mg by mouth daily., Disp: , Rfl:    latanoprost  (XALATAN ) 0.005 % ophthalmic solution, Place 1 drop into both eyes at bedtime., Disp: , Rfl:    menthol-zinc oxide (GOLD  BOND) powder, Apply 1 application topically daily as needed (for itching)., Disp: , Rfl:    Multiple Vitamins-Minerals (PRESERVISION AREDS 2) CAPS, Take 1 capsule by mouth 2 (two) times daily., Disp: , Rfl:    naproxen sodium (ANAPROX) 220 MG tablet, Take 440 mg by mouth 2 (two) times daily as needed (for pain)., Disp: , Rfl:    tamsulosin  (FLOMAX ) 0.4 MG CAPS capsule, TAKE 1 CAPSULE BY MOUTH DAILY, Disp: 30 capsule, Rfl: 0   telmisartan  (MICARDIS ) 40 MG tablet, Take 40 mg by mouth daily., Disp: , Rfl:    Allergies: Allergies  Allergen Reactions   Bupropion Hives    REVIEW OF SYSTEMS:   Review of Systems  Constitutional:  Negative for chills, fatigue and fever.  HENT:   Negative for lump/mass, mouth sores, nosebleeds, sore throat and trouble swallowing.   Eyes:  Negative for eye problems.  Respiratory:  Negative for cough and shortness of breath.   Cardiovascular:  Negative for chest pain, leg swelling and palpitations.  Gastrointestinal:  Negative for abdominal pain, constipation, diarrhea, nausea and vomiting.  Genitourinary:  Negative for bladder incontinence, difficulty urinating, dysuria, frequency, hematuria  and nocturia.   Musculoskeletal:  Negative for arthralgias, back pain, flank pain, myalgias and neck pain.  Skin:  Negative for itching and rash.  Neurological:  Positive for numbness (in legs and feet). Negative for dizziness and headaches.  Hematological:  Does not bruise/bleed easily.  Psychiatric/Behavioral:  Negative for depression, sleep disturbance and suicidal ideas. The patient is not nervous/anxious.   All other systems reviewed and are negative.    VITALS:   Blood pressure 119/63, pulse 86, temperature (!) 96.6 F (35.9 C), temperature source Tympanic, resp. rate 18, weight 174 lb 6.1 oz (79.1 kg), SpO2 97%.  Wt Readings from Last 3 Encounters:  04/30/23 174 lb 6.1 oz (79.1 kg)  03/04/23 (P) 172 lb 6.4 oz (78.2 kg)  01/01/23 175 lb 4 oz (79.5 kg)    Body mass index is 30.89 kg/m.  Performance status (ECOG): 1 - Symptomatic but completely ambulatory  PHYSICAL EXAM:   Physical Exam Vitals and nursing note reviewed. Exam conducted with a chaperone present.  Constitutional:      Appearance: Normal appearance.  Cardiovascular:     Rate and Rhythm: Normal rate and regular rhythm.     Pulses: Normal pulses.     Heart sounds: Normal heart sounds.  Pulmonary:     Effort: Pulmonary effort is normal.     Breath sounds: Normal breath sounds.  Abdominal:     Palpations: Abdomen is soft. There is no hepatomegaly, splenomegaly or mass.     Tenderness: There is no abdominal tenderness.  Musculoskeletal:     Right lower leg: No edema.     Left lower leg: No edema.  Lymphadenopathy:     Cervical: No cervical adenopathy.     Right cervical: No superficial, deep or posterior cervical adenopathy.    Left cervical: No superficial, deep or posterior cervical adenopathy.     Upper Body:     Right upper body: No supraclavicular or axillary adenopathy.     Left upper body: No supraclavicular or axillary adenopathy.  Neurological:     General: No focal deficit present.     Mental  Status: He is alert and oriented to person, place, and time.  Psychiatric:        Mood and Affect: Mood normal.        Behavior: Behavior normal.     LABS:      Latest Ref Rng & Units 04/16/2023   10:55 AM 12/11/2022    6:28 AM 09/04/2022   12:07 PM  CBC  WBC 4.0 - 10.5 K/uL 6.2  6.4  7.6   Hemoglobin 13.0 - 17.0 g/dL 85.0  85.2  86.4   Hematocrit 39.0 - 52.0 % 43.8  43.9  40.1   Platelets 150 - 400 K/uL 195  169  237       Latest Ref Rng & Units 04/16/2023   10:55 AM 12/11/2022    6:28 AM 09/04/2022   12:07 PM  CMP  Glucose 70 - 99 mg/dL 897  96  887   BUN 8 - 23 mg/dL 18  20  17    Creatinine 0.61 - 1.24 mg/dL 8.94  8.94  8.86   Sodium 135 - 145 mmol/L 136  139  137   Potassium 3.5 - 5.1 mmol/L 4.4  4.0  3.8   Chloride 98 - 111 mmol/L 102  108  102  CO2 22 - 32 mmol/L 25  19  26    Calcium  8.9 - 10.3 mg/dL 9.3  9.2  9.1   Total Protein 6.5 - 8.1 g/dL 7.1   6.7   Total Bilirubin 0.0 - 1.2 mg/dL 0.7   0.8   Alkaline Phos 38 - 126 U/L 72   65   AST 15 - 41 U/L 14   28   ALT 0 - 44 U/L 16   57      No results found for: CEA1, CEA / No results found for: CEA1, CEA No results found for: PSA1 No results found for: CAN199 No results found for: CAN125  No results found for: TOTALPROTELP, ALBUMINELP, A1GS, A2GS, BETS, BETA2SER, GAMS, MSPIKE, SPEI No results found for: TIBC, FERRITIN, IRONPCTSAT No results found for: LDH   STUDIES:   CT Chest W Contrast Result Date: 04/29/2023 CLINICAL DATA:  Non-small cell lung cancer (NSCLC), monitor. * Tracking Code: BO * EXAM: CT CHEST WITH CONTRAST TECHNIQUE: Multidetector CT imaging of the chest was performed during intravenous contrast administration. RADIATION DOSE REDUCTION: This exam was performed according to the departmental dose-optimization program which includes automated exposure control, adjustment of the mA and/or kV according to patient size and/or use of iterative reconstruction  technique. CONTRAST:  75mL OMNIPAQUE  IOHEXOL  300 MG/ML  SOLN COMPARISON:  CT scan chest from 11/27/2022. FINDINGS: Cardiovascular: Normal cardiac size. Physiological pericardial effusion. No aortic aneurysm. There are coronary artery calcifications, in keeping with coronary artery disease. There are also moderate peripheral atherosclerotic vascular calcifications of thoracic aorta and its major branches. Mediastinum/Nodes: Visualized thyroid  gland appears grossly unremarkable. No solid / cystic mediastinal masses. The esophagus is nondistended precluding optimal assessment. There are few mildly prominent mediastinal and hilar lymph nodes, which do not meet the size criteria for lymphadenopathy and appear grossly similar to the prior study. No axillary lymphadenopathy by size criteria. Lungs/Pleura: The central tracheo-bronchial tree is patent. Redemonstration of triangular/wedge-shaped opacity in the right upper lobe extending from the right superior hilum up to the pleural surface with associated traction bronchiectasis. No significant interval change. There is also right apical/lateral pleural/parenchymal opacity, which is essentially unchanged. However, there are multiple lung nodules/focal opacities, as described below: *There is a new irregular 7 x 14 mm opacity in the anterior segment of right upper lobe, inferiorly, abutting the right major fissure, which is highly concerning for local recurrent tumor. *5 mm irregular predominantly solid noncalcified opacity in the right lung lower lobe (series 3, image 99), new since the prior study. This is also suspicious. Attention on follow-up examination is recommended. *Sub 5 mm solid noncalcified opacity in the left lung lower lobe (series 3, image 125), new since the prior study, which may represent intrapulmonary lymph node versus true lung parenchymal nodule. Attention on follow-up examination is recommended. *Irregular 7 x 9 mm opacity in the left upper lobe,  apicoposterior segment (series 3, image 33), unchanged. *Subcentimeter irregular opacity in the right upper lobe, posterior segment (series 3, image 64), unchanged. *Irregular 5 mm opacity in the left upper lobe apicoposterior segment (series 3, image 65), unchanged. No pleural effusion, consolidation or pneumothorax. Upper Abdomen: Redemonstration of at least 4, simple cyst in the liver. There is a 5 x 6 mm hyperattenuating focus in the left hepatic lobe segment 2/4A junction region, which is unchanged since the prior study from 09/04/2022 and favored to represent a flash filling hemangioma. Remaining visualized upper abdominal viscera within normal limits. Musculoskeletal: The visualized soft tissues of the  chest wall are grossly unremarkable. No suspicious osseous lesions. There are mild to moderate multilevel degenerative changes in the visualized spine. IMPRESSION: 1. Since the prior study, there is a new 7 x 14 mm opacity in the right upper lobe, which is highly concerning for local recurrent tumor. 2. There are other two, 5 mm or smaller opacities in the right lower lobe and left lower lobe, as described above, which are new since the prior study. Attention on follow-up examination is recommended. 3. Multiple other nonacute observations, as described above Aortic Atherosclerosis (ICD10-I70.0). Electronically Signed   By: Ree Molt M.D.   On: 04/29/2023 09:33

## 2023-04-30 NOTE — Patient Instructions (Signed)
 [facility] at [facility] Discharge Instructions   You were seen and examined today by Dr. Rogers.  He reviewed the results of your lab work which are normal/stable.   He reviewed the results of your CT scan. There is a new spot on the right lung. The spots that were treated on the left lung have come down in size.   We will order a PET scan to see if this new spot on the right lung is cancerous or benign. If it ends up being cancer, we will refer you back to Dr. Shannon for radiation to this spot.   Return as scheduled.    Thank you for choosing [facility] at [facility] to provide your oncology and hematology care.  To afford each patient quality time with our provider, please arrive at least 15 minutes before your scheduled appointment time.   If you have a lab appointment with the Cancer Center please come in thru the Main Entrance and check in at the main information desk.  You need to re-schedule your appointment should you arrive 10 or more minutes late.  We strive to give you quality time with our providers, and arriving late affects you and other patients whose appointments are after yours.  Also, if you no show three or more times for appointments you may be dismissed from the clinic at the providers discretion.     Again, thank you for choosing [facility].  Our hope is that these requests will decrease the amount of time that you wait before being seen by our physicians.       _____________________________________________________________  Should you have questions after your visit to [facility], please contact our office at [phone] and follow the prompts.  Our office hours are 8:00 a.m. and 4:30 p.m. Monday - Friday.  Please note that voicemails left after 4:00 p.m. may not be returned until the following business day.  We are closed weekends and major holidays.  You do have access to a nurse 24-7,  just call the main number to the clinic [phone] and do not press any options, hold on the line and a nurse will answer the phone.    For prescription refill requests, have your pharmacy contact our office and allow 72 hours.    Due to Covid, you will need to wear a mask upon entering the hospital. If you do not have a mask, a mask will be given to you at the Main Entrance upon arrival. For doctor visits, patients may have 1 support person age 72 or older with them. For treatment visits, patients can not have anyone with them due to social distancing guidelines and our immunocompromised population.

## 2023-05-01 ENCOUNTER — Ambulatory Visit: Payer: Medicare Other | Admitting: Hematology

## 2023-05-16 ENCOUNTER — Encounter (HOSPITAL_COMMUNITY)
Admission: RE | Admit: 2023-05-16 | Discharge: 2023-05-16 | Disposition: A | Discharge status: Home / Self Care | Payer: Medicare Other | Source: Ambulatory Visit | Attending: Hematology | Admitting: Hematology

## 2023-05-16 DIAGNOSIS — I700 Atherosclerosis of aorta: Secondary | ICD-10-CM | POA: Insufficient documentation

## 2023-05-16 DIAGNOSIS — K118 Other diseases of salivary glands: Secondary | ICD-10-CM | POA: Diagnosis not present

## 2023-05-16 DIAGNOSIS — C3491 Malignant neoplasm of unspecified part of right bronchus or lung: Secondary | ICD-10-CM | POA: Diagnosis not present

## 2023-05-16 DIAGNOSIS — C771 Secondary and unspecified malignant neoplasm of intrathoracic lymph nodes: Secondary | ICD-10-CM | POA: Diagnosis not present

## 2023-05-16 DIAGNOSIS — J432 Centrilobular emphysema: Secondary | ICD-10-CM | POA: Diagnosis not present

## 2023-05-16 DIAGNOSIS — R918 Other nonspecific abnormal finding of lung field: Secondary | ICD-10-CM | POA: Insufficient documentation

## 2023-05-16 MED ORDER — FLUDEOXYGLUCOSE F - 18 (FDG) INJECTION
9.2000 | Freq: Once | INTRAVENOUS | Status: AC | PRN
  Administered 2023-05-16: 9.2 via INTRAVENOUS

## 2023-05-20 NOTE — Progress Notes (Signed)
 [facility] [address] [place] [address]    Clinic Day:  05/21/23   Referring physician: Benita Stabile, MD  Patient Care Team: Benita Stabile, MD as PCP - General (Internal Medicine) Doreatha Massed, MD as Medical Oncologist (Medical Oncology)   ASSESSMENT & PLAN:   Assessment: 1. Stage IIIa (T1 aN2 M0) squamous cell carcinoma of right lung: - Diagnosed in #2013, status post concurrent chemoradiation therapy with carboplatin and paclitaxel weekly, treated in [place], [place]. - He is being followed by Dr. Arbutus Ped in [place] once a year with scans. - Last CT chest with contrast on 02/20/2021 did not show any evidence of recurrence. - PET scan (09/20/2022): Left upper lobe 1.2 cm nodule hypermetabolic with SUV 2.6.  Mixed attenuation posterior right upper lobe nodule mildly hypermetabolic at 11 mm, SUV 1.8.  Enlarging right hilar lymph node 1.4 cm with SUV 3.5. - He underwent bronchoscopy and biopsy on 12/11/2022. - Lymph node station 7 FNA was benign.  11R FNA was benign. - LUL FNA consistent with squamous cell carcinoma.  RUL FNA was consistent with squamous cell carcinoma.  RUL target 3 lesion was benign. - SBRT to the left upper lobe lung, right upper lung 2 lesions from 01/22/2023 through 01/28/2023  2. Social/family history: - He is recently widowed and lives by himself. He is legally blind. He is retired from [employer] and also worked as a Audiological scientist at [employer]. - Current active smoker, smokes 2 to 3 packs/day for the last 74 years. - Mother had "cancer in her abdomen".  Father and 4 paternal uncles had prostate cancer.  3.  Prostate cancer: - Diagnosed in 2013 and had seed implant and "Calypso treatment" with 5 treatments of radiation.  He reports that his most recent PSA was undetectable.    Plan: 1.  Squamous cell carcinoma of the right upper lobe of the lung and left upper lobe of the lung: - SBRT of the left  upper lobe lung lesion and right upper lobe lung lesion on 01/28/2023. - CT chest (04/23/2023): New 7 x 14 mm nodule in the right upper lobe concerning for local recurrence.  2 other small 5 mm or smaller lesions in the right lower lobe and left lower lobe which will be monitored. - Reviewed PET scan (05/16/2023): Inferior right upper lobe 1.2 cm pulmonary nodule is hypermetabolic consistent with malignancy.  Right hilar nodal hypermetabolic some resolved.  Previously present 5 mm subcarinal node is now hypermetabolic with SUV 3.2.  Not clear if this is malignancy. - I have recommended that he follow-up with Dr. Roselind Messier for consideration of radiation to the lung nodule.  RTC 4 months with repeat CT chest.    2.  Prostate cancer: - Genetics evaluation was recommended previously because of personal and family history.    Orders Placed This Encounter  Procedures   CT CHEST W CONTRAST    Standing Status:   Future    Expected Date:   08/18/2023    Expiration Date:   05/20/2024    If indicated for the ordered procedure, I authorize the administration of contrast media per Radiology protocol:   Yes    Does the patient have a contrast media/X-ray dye allergy?:   No    Preferred imaging location?:   [facility] R Teague,acting as a scribe for Doreatha Massed, MD.,have documented all relevant documentation on the behalf of Vern Claude  Ellin Saba, MD,as directed by  Doreatha Massed, MD while in the presence of Doreatha Massed, MD.  I, Doreatha Massed MD, have reviewed the above documentation for accuracy and completeness, and I agree with the above.      Doreatha Massed, MD   2/25/20255:04 PM  CHIEF COMPLAINT:   Diagnosis: right lung squamous cell carcinoma    Cancer Staging  Squamous cell carcinoma of right lung (HCC) Staging form: Lung, AJCC 7th Edition - Clinical: Stage Unknown (TX, N2, M0) - Signed by Ellouise Newer, PA-C on 07/15/2015    Prior  Therapy: chemoradiation therapy   Current Therapy:  surveillance    HISTORY OF PRESENT ILLNESS:   Oncology History Overview Note  H/O Stage IIIA poorly differentiated squamous cell carcinoma of right lung (TXN2M0) treated with concurrent chemoradiation consisting of weekly Carbo/Taxo (02/28/2012- 04/17/2012) complicated by hospitalizations (2) and issues with thrombocytopenia resulting in completing 6/8 cycles of chemotherapy.  He underwent XRT from 02/25/2012- 04/29/2012.   Squamous cell carcinoma of right lung (HCC)  12/03/2011 PET scan   Persistent focal uptake in the rectoanal bowel, benign inflammatory possible neoplastic process cannot be excluded. Multiple enlarging hypermetabolic mediastinal lymph nodes concerning for malignancy. RML density favoring benign etiology   12/13/2011 Procedure   Colonoscopy found tubular adenomas at 75 cm and 40 cm   01/14/2012 Imaging   CT chest-no significant interval change in mediastinal lymphadenopathy when compared to 11/2011.  Spiculated opacity in the right upper lobe not significantly changed from 11/2011. Stable RML noncalcified on her nodules. Diffuse emphysema.   02/08/2012 Procedure   Bronchoscopy revealing chronic bronchitis, multiple mucous plugs are present and lavaged out from the lung. Needle aspiration of mediastinal lymph node performed.   02/08/2012 Pathology Results   Needle aspiration of mediastinal lymph node-Poorly differentiated squamous cell carcinoma   02/19/2012 Imaging   MRI brain-negative   02/19/2012 Cancer Staging   Clinically Stage IIIA   02/25/2012 - 04/29/2012 Radiation Therapy   Dr. Lissa Merlin   02/28/2012 - 04/17/2012 Chemotherapy   Weekly Carbo/Taxol requiring dose reduction due to thrombocytopenia/hospitalization.   03/27/2012 Treatment Plan Change   Treatment held due to thrombocytopenia/hospitalization   04/10/2012 Treatment Plan Change   Treatment held due to thrombocytopenia/hospitalization   04/17/2012 Treatment  Plan Change   Patient refused any further chemotherapy.   05/19/2015 Imaging   CT chest- Mild interval increase in the prominence of chronic irregular densities in the anterior right mid lung. Most compatible with areas of scarring and atelectasis. Recommend CT follow-up in 3-6 months. Mildly prominent right hilar lymph nodes.   08/17/2015 Imaging   CT chest- Areas of architectural distortion, bronchiectasis + consolidation in the RUL + RML are presumably treatment related. Given absence of a recent prior comparison examination, it is difficult to definitively exclude residual disease.      INTERVAL HISTORY:   Everest is a 83 y.o. male presenting to clinic today for follow up of right lung squamous cell carcinoma. He was last seen by me on 04/30/23.  Since his last visit, he underwent restaging PET on 05/16/23 that found: The inferior right upper lobe 1.2 cm pulmonary nodule on the 04/23/2023 chest CT is hypermetabolic, consistent with metastasis or metachronous primary. Although the right hilar nodal hypermetabolism has resolved, there is a newly hypermetabolic small subcarinal node, favoring nodal metastasis. No distant hypermetabolic metastasis. Similar left parotid hypermetabolic nodule, likely a primary neoplasm such as a Warthin's tumor.  Today, he states that he is doing well overall. His appetite level  is at 100%. His energy level is at 70%. He is accompanied by a family member. Donnivan denies any respiratory infections.  PAST MEDICAL HISTORY:   Past Medical History: Past Medical History:  Diagnosis Date   Arthritis    Carotid artery disease (HCC)    Status post bilateral CEA - Dr. Arbie Cookey   Complication of anesthesia    unable to void afterwards   Constipation    COPD (chronic obstructive pulmonary disease) (HCC)    DDD (degenerative disc disease), cervical    Degenerative disc disease, lumbar    Essential hypertension    Hyperlipidemia    Macular degeneration    Prostate cancer  (HCC)    Skin cancer, basal cell    Squamous cell lung cancer (HCC) 07/15/2015   Status post chemotherapy and XRT - Dr. Arbutus Ped   Tubular adenoma of colon     Surgical History: Past Surgical History:  Procedure Laterality Date   BRONCHIAL BIOPSY  12/11/2022   Procedure: BRONCHIAL BIOPSIES;  Surgeon: Leslye Peer, MD;  Location: [facility] ENDOSCOPY;  Service: Pulmonary;;   BRONCHIAL BRUSHINGS  12/11/2022   Procedure: BRONCHIAL BRUSHINGS;  Surgeon: Leslye Peer, MD;  Location: [facility] ENDOSCOPY;  Service: Pulmonary;;   BRONCHIAL NEEDLE ASPIRATION BIOPSY  12/11/2022   Procedure: BRONCHIAL NEEDLE ASPIRATION BIOPSIES;  Surgeon: Leslye Peer, MD;  Location: [facility] ENDOSCOPY;  Service: Pulmonary;;   BRONCHIAL WASHINGS  12/11/2022   Procedure: BRONCHIAL WASHINGS;  Surgeon: Leslye Peer, MD;  Location: [facility] ENDOSCOPY;  Service: Pulmonary;;   CATARACT EXTRACTION W/ INTRAOCULAR LENS  IMPLANT, BILATERAL Bilateral    COLONOSCOPY     COLONOSCOPY WITH PROPOFOL N/A 05/17/2017   Procedure: COLONOSCOPY WITH PROPOFOL;  Surgeon: Malissa Hippo, MD;  Location: [facility] ENDO SUITE;  Service: Endoscopy;  Laterality: N/A;  10:15   ENDARTERECTOMY Right 08/29/2015   Procedure: RIGHT CAROTID ENDARTERECTOMY WITH PATCH ANGIOPLASTY;  Surgeon: Larina Earthly, MD;  Location: [facility] OR;  Service: Vascular;  Laterality: Right;   ENDARTERECTOMY Left 10/07/2015   Procedure: LEFT CAROTID ARTERY ENDARTERECTOMY;  Surgeon: Larina Earthly, MD;  Location[facility] OR;  Service: Vascular;  Laterality: Left;   FIDUCIAL MARKER PLACEMENT  12/11/2022   Procedure: FIDUCIAL MARKER PLACEMENT;  Surgeon: Leslye Peer, MD;  Location: [facility] ENDOSCOPY;  Service: Pulmonary;;   FINE NEEDLE ASPIRATION  12/11/2022   Procedure: FINE NEEDLE ASPIRATION;  Surgeon: Leslye Peer, MD;  Location: [facility] ENDOSCOPY;  Service: Pulmonary;;   NOSE SURGERY     "rebulit my nose; related to skin cancer"   PATCH ANGIOPLASTY Left 10/07/2015   Procedure: With HEMASHIELD PLATINUM PATCH  ANGIOPLASTY;  Surgeon: Larina Earthly, MD;  Location: [facility] OR;  Service: Vascular;  Laterality: Left;   POLYPECTOMY  05/17/2017   Procedure: POLYPECTOMY;  Surgeon: Malissa Hippo, MD;  Location: [facility] ENDO SUITE;  Service: Endoscopy;;  colon    PROSTATE BIOPSY     SKIN CANCER EXCISION Right    neck   TONSILLECTOMY     VIDEO BRONCHOSCOPY WITH ENDOBRONCHIAL ULTRASOUND N/A 12/11/2022   Procedure: VIDEO BRONCHOSCOPY WITH ENDOBRONCHIAL ULTRASOUND;  Surgeon: Leslye Peer, MD;  Location: [facility] ENDOSCOPY;  Service: Pulmonary;  Laterality: N/A;    Social History: Social History   Socioeconomic History   Marital status: Married    Spouse name: Not on file   Number of children: Not on file   Years of education: Not on file   Highest education level: Not on file  Occupational History   Not  on file  Tobacco Use   Smoking status: Every Day    Current packs/day: 2.00    Average packs/day: 2.0 packs/day for 77.1 years (154.3 ttl pk-yrs)    Types: Cigarettes    Start date: 04/[birth year]/1946   Smokeless tobacco: Never  Vaping Use   Vaping status: Never Used  Substance and Sexual Activity   Alcohol use: Not Currently    Alcohol/week: 35.0 standard drinks of alcohol    Types: 35 Cans of beer per week   Drug use: No   Sexual activity: Not on file  Other Topics Concern   Not on file  Social History Narrative   Not on file   Social Drivers of Health   Financial Resource Strain: Not on file  Food Insecurity: No Food Insecurity (01/01/2023)   Hunger Vital Sign    Worried About Running Out of Food in the Last Year: Never true    Ran Out of Food in the Last Year: Never true  Transportation Needs: No Transportation Needs (01/01/2023)   PRAPARE - Administrator, Civil Service (Medical): No    Lack of Transportation (Non-Medical): No  Physical Activity: Not on file  Stress: Not on file  Social Connections: Not on file  Intimate Partner Violence: Not At Risk (01/01/2023)   Humiliation, Afraid,  Rape, and Kick questionnaire    Fear of Current or Ex-Partner: No    Emotionally Abused: No    Physically Abused: No    Sexually Abused: No    Family History: Family History  Problem Relation Age of Onset   Cancer Mother    Stroke Father    Heart disease Father    Diabetes Mellitus II Sister     Current Medications:  Current Outpatient Medications:    albuterol (VENTOLIN HFA) 108 (90 Base) MCG/ACT inhaler, Inhale 1 puff into the lungs every 6 (six) hours as needed., Disp: , Rfl:    aspirin 325 MG tablet, Take 1 tablet (325 mg total) by mouth daily., Disp: , Rfl:    atorvastatin (LIPITOR) 10 MG tablet, Take 10 mg by mouth daily., Disp: , Rfl:    bevacizumab (AVASTIN) 400 MG/16ML SOLN, 1.25 mg every 8 (eight) weeks. Left eye, Disp: , Rfl:    Cyanocobalamin (VITAMIN B-12) 2500 MCG SUBL, Place 2,500 mcg under the tongue daily., Disp: , Rfl:    FLUoxetine (PROZAC) 20 MG capsule, Take 20 mg by mouth daily., Disp: , Rfl:    latanoprost (XALATAN) 0.005 % ophthalmic solution, Place 1 drop into both eyes at bedtime., Disp: , Rfl:    menthol-zinc oxide (GOLD BOND) powder, Apply 1 application topically daily as needed (for itching)., Disp: , Rfl:    Multiple Vitamins-Minerals (PRESERVISION AREDS 2) CAPS, Take 1 capsule by mouth 2 (two) times daily., Disp: , Rfl:    naproxen sodium (ANAPROX) 220 MG tablet, Take 440 mg by mouth 2 (two) times daily as needed (for pain)., Disp: , Rfl:    tamsulosin (FLOMAX) 0.4 MG CAPS capsule, TAKE 1 CAPSULE BY MOUTH DAILY, Disp: 30 capsule, Rfl: 0   telmisartan (MICARDIS) 40 MG tablet, Take 40 mg by mouth daily., Disp: , Rfl:    Allergies: Allergies  Allergen Reactions   Bupropion Hives    REVIEW OF SYSTEMS:   Review of Systems  Constitutional:  Negative for chills, fatigue and fever.  HENT:   Positive for trouble swallowing. Negative for lump/mass, mouth sores, nosebleeds and sore throat.   Eyes:  Negative for eye problems.  Respiratory:  Positive for  shortness of breath (with exertion). Negative for cough.   Cardiovascular:  Negative for chest pain, leg swelling and palpitations.  Gastrointestinal:  Negative for abdominal pain, constipation, diarrhea, nausea and vomiting.  Genitourinary:  Negative for bladder incontinence, difficulty urinating, dysuria, frequency, hematuria and nocturia.   Musculoskeletal:  Negative for arthralgias, back pain, flank pain, myalgias and neck pain.  Skin:  Negative for itching and rash.  Neurological:  Positive for numbness (in feet and legs). Negative for dizziness and headaches.  Hematological:  Does not bruise/bleed easily.  Psychiatric/Behavioral:  Positive for sleep disturbance. Negative for depression and suicidal ideas. The patient is not nervous/anxious.   All other systems reviewed and are negative.    VITALS:   Blood pressure 123/68, pulse 90, temperature (!) 96.9 F (36.1 C), temperature source Tympanic, resp. rate 18, weight 173 lb 11.6 oz (78.8 kg), SpO2 96%.  Wt Readings from Last 3 Encounters:  05/21/23 173 lb 11.6 oz (78.8 kg)  04/30/23 174 lb 6.1 oz (79.1 kg)  03/04/23 (P) 172 lb 6.4 oz (78.2 kg)    Body mass index is 30.77 kg/m.  Performance status (ECOG): 1 - Symptomatic but completely ambulatory  PHYSICAL EXAM:   Physical Exam Vitals and nursing note reviewed. Exam conducted with a chaperone present.  Constitutional:      Appearance: Normal appearance.  Cardiovascular:     Rate and Rhythm: Normal rate and regular rhythm.     Pulses: Normal pulses.     Heart sounds: Normal heart sounds.  Pulmonary:     Effort: Pulmonary effort is normal.     Breath sounds: Normal breath sounds.  Abdominal:     Palpations: Abdomen is soft. There is no hepatomegaly, splenomegaly or mass.     Tenderness: There is no abdominal tenderness.  Musculoskeletal:     Right lower leg: No edema.     Left lower leg: No edema.  Lymphadenopathy:     Cervical: No cervical adenopathy.     Right  cervical: No superficial, deep or posterior cervical adenopathy.    Left cervical: No superficial, deep or posterior cervical adenopathy.     Upper Body:     Right upper body: No supraclavicular or axillary adenopathy.     Left upper body: No supraclavicular or axillary adenopathy.  Neurological:     General: No focal deficit present.     Mental Status: He is alert and oriented to person, place, and time.  Psychiatric:        Mood and Affect: Mood normal.        Behavior: Behavior normal.     LABS:      Latest Ref Rng & Units 04/16/2023   10:55 AM 12/11/2022    6:28 AM 09/04/2022   12:07 PM  CBC  WBC 4.0 - 10.5 K/uL 6.2  6.4  7.6   Hemoglobin 13.0 - 17.0 g/dL 16.1  09.6  04.5   Hematocrit 39.0 - 52.0 % 43.8  43.9  40.1   Platelets 150 - 400 K/uL 195  169  237       Latest Ref Rng & Units 04/16/2023   10:55 AM 12/11/2022    6:28 AM 09/04/2022   12:07 PM  CMP  Glucose 70 - 99 mg/dL 409  96  811   BUN 8 - 23 mg/dL 18  20  17    Creatinine 0.61 - 1.24 mg/dL 9.14  7.82  9.56   Sodium 135 - 145 mmol/L 136  139  137   Potassium 3.5 - 5.1 mmol/L 4.4  4.0  3.8   Chloride 98 - 111 mmol/L 102  108  102   CO2 22 - 32 mmol/L 25  19  26    Calcium 8.9 - 10.3 mg/dL 9.3  9.2  9.1   Total Protein 6.5 - 8.1 g/dL 7.1   6.7   Total Bilirubin 0.0 - 1.2 mg/dL 0.7   0.8   Alkaline Phos 38 - 126 U/L 72   65   AST 15 - 41 U/L 14   28   ALT 0 - 44 U/L 16   57      No results found for: "CEA1", "CEA" / No results found for: "CEA1", "CEA" No results found for: "PSA1" No results found for: "ZHY865" No results found for: "CAN125"  No results found for: "TOTALPROTELP", "ALBUMINELP", "A1GS", "A2GS", "BETS", "BETA2SER", "GAMS", "MSPIKE", "SPEI" No results found for: "TIBC", "FERRITIN", "IRONPCTSAT" No results found for: "LDH"   STUDIES:   NM PET Image Restag (PS) Skull Base To Thigh Result Date: 05/21/2023 CLINICAL DATA:  Subsequent treatment strategy for non-small-cell lung cancer. Restaging.  Biopsy 2 months ago. EXAM: NUCLEAR MEDICINE PET SKULL BASE TO THIGH TECHNIQUE: 9.2 mCi F-18 FDG was injected intravenously. Full-ring PET imaging was performed from the skull base to thigh after the radiotracer. CT data was obtained and used for attenuation correction and anatomic localization. Fasting blood glucose: 100 mg/dl COMPARISON:  78/46/9629 chest CT.  PET of 09/20/2022 FINDINGS: Mediastinal blood pool activity: SUV max 2.1 Liver activity: SUV max NA NECK: Deep left parotid nodule again identified at 4 mm and a S.U.V. max of 5.5, similar on 17/202. No cervical nodal hypermetabolism. Incidental CT findings: No cervical adenopathy. CHEST: Again identified is right upper lobe presumably radiation induced consolidation and low-level hypermetabolism centrally. The right hilar hypermetabolism has resolved. A subcarinal node measures 5 mm and a S.U.V. max of 3.2 on 63/202. This is relatively similar in size but not hypermetabolic on the prior. The inferior right upper lobe spiculated nodule on the 04/23/2023 diagnostic CT is hypermetabolic. Example at 1.2 cm and a S.U.V. max of 5.0 on 62/202. Incidental CT findings: Centrilobular emphysema. Deferred to recent diagnostic CT. Aortic and coronary artery calcification. Aortic valve calcification. Radiation fiducials within the left upper lobe, adjacent to a 7 mm nodule which is not significantly hypermetabolic but at the low end of PET resolution. ABDOMEN/PELVIS: No abdominopelvic parenchymal or nodal hypermetabolism. Incidental CT findings: Normal adrenal glands. Punctate left renal collecting system calculus. Macrolobulated left-sided renal dominant cystic lesion or adjacent cysts at up to 7.7 cm. Present back to 2011 and does not warrant specific imaging follow-up. Hepatic cysts. Radiation seeds in the prostate. SKELETON: No abnormal marrow activity. Incidental CT findings: None. IMPRESSION: 1. The inferior right upper lobe 1.2 cm pulmonary nodule on the 04/23/2023  chest CT is hypermetabolic, consistent with metastasis or metachronous primary. 2. Although the right hilar nodal hypermetabolism has resolved, there is a newly hypermetabolic small subcarinal node, favoring nodal metastasis. 3. No distant hypermetabolic metastasis. 4. Similar left parotid hypermetabolic nodule, likely a primary neoplasm such as a Warthin's tumor. 5. Incidental findings, including: Aortic atherosclerosis (ICD10-I70.0), coronary artery atherosclerosis and emphysema (ICD10-J43.9). Left nephrolithiasis. Electronically Signed   By: Jeronimo Greaves M.D.   On: 05/21/2023 09:05   CT Chest W Contrast Result Date: 04/29/2023 CLINICAL DATA:  Non-small cell lung cancer (NSCLC), monitor. * Tracking Code: BO * EXAM: CT CHEST WITH CONTRAST TECHNIQUE: Multidetector  CT imaging of the chest was performed during intravenous contrast administration. RADIATION DOSE REDUCTION: This exam was performed according to the departmental dose-optimization program which includes automated exposure control, adjustment of the mA and/or kV according to patient size and/or use of iterative reconstruction technique. CONTRAST:  75mL OMNIPAQUE IOHEXOL 300 MG/ML  SOLN COMPARISON:  CT scan chest from 11/27/2022. FINDINGS: Cardiovascular: Normal cardiac size. Physiological pericardial effusion. No aortic aneurysm. There are coronary artery calcifications, in keeping with coronary artery disease. There are also moderate peripheral atherosclerotic vascular calcifications of thoracic aorta and its major branches. Mediastinum/Nodes: Visualized thyroid gland appears grossly unremarkable. No solid / cystic mediastinal masses. The esophagus is nondistended precluding optimal assessment. There are few mildly prominent mediastinal and hilar lymph nodes, which do not meet the size criteria for lymphadenopathy and appear grossly similar to the prior study. No axillary lymphadenopathy by size criteria. Lungs/Pleura: The central tracheo-bronchial tree  is patent. Redemonstration of triangular/wedge-shaped opacity in the right upper lobe extending from the right superior hilum up to the pleural surface with associated traction bronchiectasis. No significant interval change. There is also right apical/lateral pleural/parenchymal opacity, which is essentially unchanged. However, there are multiple lung nodules/focal opacities, as described below: *There is a new irregular 7 x 14 mm opacity in the anterior segment of right upper lobe, inferiorly, abutting the right major fissure, which is highly concerning for local recurrent tumor. *5 mm irregular predominantly solid noncalcified opacity in the right lung lower lobe (series 3, image 99), new since the prior study. This is also suspicious. Attention on follow-up examination is recommended. *Sub 5 mm solid noncalcified opacity in the left lung lower lobe (series 3, image 125), new since the prior study, which may represent intrapulmonary lymph node versus true lung parenchymal nodule. Attention on follow-up examination is recommended. *Irregular 7 x 9 mm opacity in the left upper lobe, apicoposterior segment (series 3, image 33), unchanged. *Subcentimeter irregular opacity in the right upper lobe, posterior segment (series 3, image 64), unchanged. *Irregular 5 mm opacity in the left upper lobe apicoposterior segment (series 3, image 65), unchanged. No pleural effusion, consolidation or pneumothorax. Upper Abdomen: Redemonstration of at least 4, simple cyst in the liver. There is a 5 x 6 mm hyperattenuating focus in the left hepatic lobe segment 2/4A junction region, which is unchanged since the prior study from 09/04/2022 and favored to represent a flash filling hemangioma. Remaining visualized upper abdominal viscera within normal limits. Musculoskeletal: The visualized soft tissues of the chest wall are grossly unremarkable. No suspicious osseous lesions. There are mild to moderate multilevel degenerative changes in  the visualized spine. IMPRESSION: 1. Since the prior study, there is a new 7 x 14 mm opacity in the right upper lobe, which is highly concerning for local recurrent tumor. 2. There are other two, 5 mm or smaller opacities in the right lower lobe and left lower lobe, as described above, which are new since the prior study. Attention on follow-up examination is recommended. 3. Multiple other nonacute observations, as described above Aortic Atherosclerosis (ICD10-I70.0). Electronically Signed   By: Jules Schick M.D.   On: 04/29/2023 09:33

## 2023-05-21 ENCOUNTER — Inpatient Hospital Stay (HOSPITAL_BASED_OUTPATIENT_CLINIC_OR_DEPARTMENT_OTHER): Payer: Medicare Other | Admitting: Hematology

## 2023-05-21 ENCOUNTER — Telehealth: Payer: Self-pay | Admitting: Radiation Oncology

## 2023-05-21 VITALS — BP 123/68 | HR 90 | Temp 96.9°F | Resp 18 | Wt 173.7 lb

## 2023-05-21 DIAGNOSIS — Z8546 Personal history of malignant neoplasm of prostate: Secondary | ICD-10-CM | POA: Diagnosis not present

## 2023-05-21 DIAGNOSIS — Z08 Encounter for follow-up examination after completed treatment for malignant neoplasm: Secondary | ICD-10-CM | POA: Diagnosis not present

## 2023-05-21 DIAGNOSIS — C3491 Malignant neoplasm of unspecified part of right bronchus or lung: Secondary | ICD-10-CM

## 2023-05-21 DIAGNOSIS — Z85118 Personal history of other malignant neoplasm of bronchus and lung: Secondary | ICD-10-CM | POA: Diagnosis not present

## 2023-05-21 NOTE — Patient Instructions (Addendum)
 [facility] at [facility] Discharge Instructions   You were seen and examined today by Dr. Ellin Saba.  He reviewed the results of your lab work which are normal/stable.   He reviewed the results of your PET scan. It is showing the spot in the right upper lung is lighting up, consistent with it being cancerous.   We will refer you back to Dr. Roselind Messier for evaluation for radiation therapy.  We will see you back in 4 months. We will repeat lab work and a CT scan prior to this visit.    Return as scheduled.    Thank you for choosing [facility] at [facility] to provide your oncology and hematology care.  To afford each patient quality time with our provider, please arrive at least 15 minutes before your scheduled appointment time.   If you have a lab appointment with the Cancer Center please come in thru the Main Entrance and check in at the main information desk.  You need to re-schedule your appointment should you arrive 10 or more minutes late.  We strive to give you quality time with our providers, and arriving late affects you and other patients whose appointments are after yours.  Also, if you no show three or more times for appointments you may be dismissed from the clinic at the providers discretion.     Again, thank you for choosing [facility].  Our hope is that these requests will decrease the amount of time that you wait before being seen by our physicians.       _____________________________________________________________  Should you have questions after your visit to [facility] - , please contact our office at [phone] and follow the prompts.  Our office hours are 8:00 a.m. and 4:30 p.m. Monday - Friday.  Please note that voicemails left after 4:00 p.m. may not be returned until the following business day.  We are closed weekends and major holidays.  You do have access to a nurse 24-7, just call the main  number to the clinic [phone] and do not press any options, hold on the line and a nurse will answer the phone.    For prescription refill requests, have your pharmacy contact our office and allow 72 hours.    Due to Covid, you will need to wear a mask upon entering the hospital. If you do not have a mask, a mask will be given to you at the Main Entrance upon arrival. For doctor visits, patients may have 1 support person age 60 or older with them. For treatment visits, patients can not have anyone with them due to social distancing guidelines and our immunocompromised population.

## 2023-05-21 NOTE — Telephone Encounter (Signed)
 Unable to LVM to schedule RECON per Dr. Marice Potter request.

## 2023-05-22 ENCOUNTER — Inpatient Hospital Stay: Payer: Medicare Other | Admitting: Hematology

## 2023-05-23 ENCOUNTER — Telehealth: Payer: Self-pay | Admitting: Radiation Oncology

## 2023-05-23 NOTE — Telephone Encounter (Signed)
 Unable to LVM on either number for pt listed in chart.Spoke to pt's son who advised he would pass on message to pt to c/b and schedule RECON

## 2023-05-24 ENCOUNTER — Telehealth: Payer: Self-pay | Admitting: Radiation Oncology

## 2023-05-24 NOTE — Telephone Encounter (Signed)
 Unable to LVM to schedule RECON with Dr. Roselind Messier.

## 2023-05-27 ENCOUNTER — Telehealth: Payer: Self-pay | Admitting: Radiation Oncology

## 2023-05-27 NOTE — Telephone Encounter (Signed)
 Pt's son called to schedule appt for pt. Able to schedule next available with Dr. Roselind Messier for [facility].

## 2023-05-29 ENCOUNTER — Telehealth: Payer: Self-pay | Admitting: Radiation Oncology

## 2023-05-29 ENCOUNTER — Ambulatory Visit
Admission: RE | Admit: 2023-05-29 | Discharge: 2023-05-29 | Disposition: A | Discharge status: Home / Self Care | Source: Ambulatory Visit | Attending: Radiation Oncology | Admitting: Radiation Oncology

## 2023-05-29 ENCOUNTER — Ambulatory Visit

## 2023-05-29 NOTE — Telephone Encounter (Signed)
 Called pt's son to r/s missed appt from today. Son didn't realize what day it was and that he had missed appt. Able to r/s to next available.

## 2023-06-07 NOTE — Progress Notes (Signed)
 Location of tumor and Histology per Pathology Report: Right upper/ and lower lobe  Biopsy:   Past/Anticipated interventions by surgeon, if any:   Past/Anticipated interventions by medical oncology, if any: Dr. Landry Corporal     Pain issues, if any:  no   SAFETY ISSUES: Prior radiation? Yes  SBRT left lung Pacemaker/ICD? no Possible current pregnancy? no Is the patient on methotrexate? no  Current Complaints / other details:   BP 121/63   Pulse 89   Temp (!) 97.3 F (36.3 C)   Resp 18   Ht 5' (1.524 m)   Wt 165 lb 9.6 oz (75.1 kg)   SpO2 96%   BMI 32.34 kg/m

## 2023-06-07 NOTE — Progress Notes (Signed)
 Radiation Oncology         [phone] ________________________________  Outpatient Re-Consultation  Name: Eric Hinton MRN: [id number]  Date: 06/10/2023  DOB: [birth year]  KZ:SWFU, Kathleene Hazel, MD  Doreatha Massed, MD   REFERRING PHYSICIAN: Doreatha Massed, MD  DIAGNOSIS: The primary encounter diagnosis was Malignant neoplasm of right upper lobe of lung (HCC). A diagnosis of Squamous cell carcinoma of right lung (HCC) was also pertinent to this visit.  The encounter diagnosis was Squamous cell carcinoma of right lung (HCC).   Bilateral lung cancers: Squamous cell carcinoma of the right upper lobe and left upper lobe diagnosed in September/October of 2024, s/p XRT - Now with a new hypermetabolic RUL nodule concerning for malignancy (February 2025)   History of squamous cell carcinoma of right lung diagnosed in 2013, s/p concurrent chemoradiation    History of prostate cancer also diagnosed in 2013, s/p Calypso treatment     HISTORY OF PRESENT ILLNESS::Eric Hinton is a 83 y.o. male who returns today as a courtesy of Dr. Ellin Saba for an opinion concerning radiation therapy as part of management for his recurrent lung cancer. To review, the patient was initially diagnosed with right lung cancer in 2013, s/p concurrent chemoradiation therapy with carboplatin and paclitaxel weekly, treated in [place], [place]. He was also diagnosed with prostate cancer that same year which he received radiation for. He was then diagnosed recurrent lung cancer of the bilateral upper lobes in October of 2024 and was referred to [place] for treatment (XRT completed on 01/28/23). He was last seen here for a routine follow-up visit on 03/04/23 and was doing well at that time.   He has since continued to follow with Dr. Ellin Saba for surveillance of his disease and accordingly presented for a routine chest CT on 04/23/23 which showed a new irregular 7 x 14 mm opacity in the anterior-inferior segment of  right upper lobe, abutting the right major fissure, with features highly concerning for local recurrent tumor. CT findings also including two other new opacities in the right lower lobe and left lower lobe, each of which measuring 5 mm in the greatest extent.  The other 2 areas of opacity in the apicoposterior LUL and the single sub-centimeter irregular opacity in the posterior RUL otherwise appeared stable.    In light of findings, he presented to Dr. Ellin Saba on 04/30/23 who recommended undergoing a PET scan to further evaluate these new findings. As such, he presented for a PET scan on 05/16/23 which showed hypermetabolism associated with the inferior RUL pulmonary nodule, measuring 1.2 cm in the greatest extent, consistent with metastatic disease or a metachronous primary. PET finding also indicated a newly hypermetabolic small subcarinal node measuring 5 mm, favoring nodal metastasis, along with the previously demonstrated and stable appearing left parotid hypermetabolic nodule which likely represents a primary neoplasm such as a Warthin's tumor. PET otherwise showed resolution of the previously demonstrated right hilar nodal hypermetabolism, and no evidence of distant hypermetabolic metastatic disease.    Dr. Ellin Saba reviewed these findings with the patient during a visit on 05/21/23. Per encounter notes, the patient was noted to be feeling well overall and denied any significant symptoms or pain. In terms of treatment, Dr. Ellin Saba has recommended radiation therapy to the hypermetabolic RUL nodule which we will discuss in detail today.    PREVIOUS RADIATION THERAPY: Yes   3) Radiation Treatment Dates: First Treatment Date: 2023-01-22 - Last Treatment Date: 2023-01-28  Site(s)/Dose/Technique/Mode:  Site: Left upper lung  Technique:  SBRT/SRT-IMRT Mode: Photon Dose Per Fraction: 18 Gy Prescribed Dose (Delivered / Prescribed): 54 Gy / 54 Gy Prescribed Fxs (Delivered / Prescribed): 3 / 3    Site: Right upper lung - target 1  Technique: SBRT/SRT-IMRT Mode: Photon Dose Per Fraction: 18 Gy Prescribed Dose (Delivered / Prescribed): 54 Gy / 54 Gy Prescribed Fxs (Delivered / Prescribed): 3 / 3   Site: Right upper lung - target 2  Technique: SBRT/SRT-IMRT Mode: Photon Dose Per Fraction: 18 Gy Prescribed Dose (Delivered / Prescribed): 54 Gy / 54 Gy Prescribed Fxs (Delivered / Prescribed): 3 / 3  2) Stage IIIa (T1 aN2 M0) squamous cell carcinoma of right lung diagnosed in 2013, s/p concurrent chemoradiation therapy with carboplatin and paclitaxel weekly, treated in [place], [place]. -- Radiation treatment dates: 02/25/2012 through 04/29/2012  -- Radiation oncologist: Dr. Lissa Merlin    1) Prostate cancer diagnosed in 2013, treated with seed implant and "Calypso treatment" completed in November 2015 with 5 treatments of radiation.        PAST MEDICAL HISTORY:  Past Medical History:  Diagnosis Date   Arthritis    Carotid artery disease (HCC)    Status post bilateral CEA - Dr. Arbie Cookey   Complication of anesthesia    unable to void afterwards   Constipation    COPD (chronic obstructive pulmonary disease) (HCC)    DDD (degenerative disc disease), cervical    Degenerative disc disease, lumbar    Essential hypertension    Hyperlipidemia    Macular degeneration    Prostate cancer (HCC)    Skin cancer, basal cell    Squamous cell lung cancer (HCC) 07/15/2015   Status post chemotherapy and XRT - Dr. Arbutus Ped   Tubular adenoma of colon     PAST SURGICAL HISTORY: Past Surgical History:  Procedure Laterality Date   BRONCHIAL BIOPSY  12/11/2022   Procedure: BRONCHIAL BIOPSIES;  Surgeon: Leslye Peer, MD;  Location: [facility] ENDOSCOPY;  Service: Pulmonary;;   BRONCHIAL BRUSHINGS  12/11/2022   Procedure: BRONCHIAL BRUSHINGS;  Surgeon: Leslye Peer, MD;  Location: [facility] ENDOSCOPY;  Service: Pulmonary;;   BRONCHIAL NEEDLE ASPIRATION BIOPSY  12/11/2022   Procedure: BRONCHIAL NEEDLE  ASPIRATION BIOPSIES;  Surgeon: Leslye Peer, MD;  Location: [facility] ENDOSCOPY;  Service: Pulmonary;;   BRONCHIAL WASHINGS  12/11/2022   Procedure: BRONCHIAL WASHINGS;  Surgeon: Leslye Peer, MD;  Location: [facility] ENDOSCOPY;  Service: Pulmonary;;   CATARACT EXTRACTION W/ INTRAOCULAR LENS  IMPLANT, BILATERAL Bilateral    COLONOSCOPY     COLONOSCOPY WITH PROPOFOL N/A 05/17/2017   Procedure: COLONOSCOPY WITH PROPOFOL;  Surgeon: Malissa Hippo, MD;  Location: [facility] ENDO SUITE;  Service: Endoscopy;  Laterality: N/A;  10:15   ENDARTERECTOMY Right 08/29/2015   Procedure: RIGHT CAROTID ENDARTERECTOMY WITH PATCH ANGIOPLASTY;  Surgeon: Larina Earthly, MD;  Location: [facility] OR;  Service: Vascular;  Laterality: Right;   ENDARTERECTOMY Left 10/07/2015   Procedure: LEFT CAROTID ARTERY ENDARTERECTOMY;  Surgeon: Larina Earthly, MD;  Location[facility] OR;  Service: Vascular;  Laterality: Left;   FIDUCIAL MARKER PLACEMENT  12/11/2022   Procedure: FIDUCIAL MARKER PLACEMENT;  Surgeon: Leslye Peer, MD;  Location: [facility] ENDOSCOPY;  Service: Pulmonary;;   FINE NEEDLE ASPIRATION  12/11/2022   Procedure: FINE NEEDLE ASPIRATION;  Surgeon: Leslye Peer, MD;  Location: [facility] ENDOSCOPY;  Service: Pulmonary;;   NOSE SURGERY     "rebulit my nose; related to skin cancer"   PATCH ANGIOPLASTY Left 10/07/2015   Procedure: With HEMASHIELD PLATINUM PATCH ANGIOPLASTY;  Surgeon: Larina Earthly, MD;  Location: [facility] OR;  Service: Vascular;  Laterality: Left;   POLYPECTOMY  05/17/2017   Procedure: POLYPECTOMY;  Surgeon: Malissa Hippo, MD;  Location: [facility] ENDO SUITE;  Service: Endoscopy;;  colon    PROSTATE BIOPSY     SKIN CANCER EXCISION Right    neck   TONSILLECTOMY     VIDEO BRONCHOSCOPY WITH ENDOBRONCHIAL ULTRASOUND N/A 12/11/2022   Procedure: VIDEO BRONCHOSCOPY WITH ENDOBRONCHIAL ULTRASOUND;  Surgeon: Leslye Peer, MD;  Location: [facility] ENDOSCOPY;  Service: Pulmonary;  Laterality: N/A;    FAMILY HISTORY:  Family History  Problem Relation Age of Onset    Cancer Mother    Stroke Father    Heart disease Father    Diabetes Mellitus II Sister     SOCIAL HISTORY:  Social History   Tobacco Use   Smoking status: Every Day    Current packs/day: 2.00    Average packs/day: 2.0 packs/day for 77.2 years (154.4 ttl pk-yrs)    Types: Cigarettes    Start date: 04/03/1946   Smokeless tobacco: Never  Vaping Use   Vaping status: Never Used  Substance Use Topics   Alcohol use: Not Currently    Alcohol/week: 35.0 standard drinks of alcohol    Types: 35 Cans of beer per week   Drug use: No    ALLERGIES:  Allergies  Allergen Reactions   Bupropion Hives    MEDICATIONS:  Current Outpatient Medications  Medication Sig Dispense Refill   albuterol (VENTOLIN HFA) 108 (90 Base) MCG/ACT inhaler Inhale 1 puff into the lungs every 6 (six) hours as needed.     aspirin 325 MG tablet Take 1 tablet (325 mg total) by mouth daily.     atorvastatin (LIPITOR) 10 MG tablet Take 10 mg by mouth daily.     Cyanocobalamin (VITAMIN B-12) 2500 MCG SUBL Place 2,500 mcg under the tongue daily.     FLUoxetine (PROZAC) 20 MG capsule Take 20 mg by mouth daily.     latanoprost (XALATAN) 0.005 % ophthalmic solution Place 1 drop into both eyes at bedtime.     menthol-zinc oxide (GOLD BOND) powder Apply 1 application topically daily as needed (for itching).     Multiple Vitamins-Minerals (PRESERVISION AREDS 2) CAPS Take 1 capsule by mouth 2 (two) times daily.     naproxen sodium (ANAPROX) 220 MG tablet Take 440 mg by mouth 2 (two) times daily as needed (for pain).     tamsulosin (FLOMAX) 0.4 MG CAPS capsule TAKE 1 CAPSULE BY MOUTH DAILY 30 capsule 0   telmisartan (MICARDIS) 40 MG tablet Take 40 mg by mouth daily.     bevacizumab (AVASTIN) 400 MG/16ML SOLN 1.25 mg every 8 (eight) weeks. Left eye (Patient not taking: Reported on 06/10/2023)     No current facility-administered medications for this encounter.    REVIEW OF SYSTEMS:  A 10+ POINT REVIEW OF SYSTEMS WAS OBTAINED  including neurology, dermatology, psychiatry, cardiac, respiratory, lymph, extremities, GI, GU, musculoskeletal, constitutional, reproductive, HEENT.  He denies any significant pain within the chest area significant cough or hemoptysis.   PHYSICAL EXAM:  height is 5' (1.524 m) and weight is 165 lb 9.6 oz (75.1 kg). His temperature is 97.3 F (36.3 C) (abnormal). His blood pressure is 121/63 and his pulse is 89. His respiration is 18 and oxygen saturation is 96%.   General: Alert and oriented, in no acute distress HEENT: Head is normocephalic. Extraocular movements are intact.  Neck: Neck is supple, no palpable  cervical or supraclavicular lymphadenopathy. Heart: Regular in rate and rhythm with no murmurs, rubs, or gallops. Chest: Clear to auscultation bilaterally, with no rhonchi, wheezes, or rales. Abdomen: Soft, nontender, nondistended, with no rigidity or guarding. Extremities: No cyanosis or edema. Lymphatics: see Neck Exam Skin: No concerning lesions. Musculoskeletal: symmetric strength and muscle tone throughout. Neurologic: Cranial nerves II through XII are grossly intact. No obvious focalities. Speech is fluent. Coordination is intact. Psychiatric: Judgment and insight are intact. Affect is appropriate.   ECOG = 0  0 - Asymptomatic (Fully active, able to carry on all predisease activities without restriction)  1 - Symptomatic but completely ambulatory (Restricted in physically strenuous activity but ambulatory and able to carry out work of a light or sedentary nature. For example, light housework, office work)  2 - Symptomatic, <50% in bed during the day (Ambulatory and capable of all self care but unable to carry out any work activities. Up and about more than 50% of waking hours)  3 - Symptomatic, >50% in bed, but not bedbound (Capable of only limited self-care, confined to bed or chair 50% or more of waking hours)  4 - Bedbound (Completely disabled. Cannot carry on any  self-care. Totally confined to bed or chair)  5 - Death   Santiago Glad MM, Creech RH, Tormey DC, et al. [phone]). "Toxicity and response criteria of the [facility]". Am. Evlyn Clines. Oncol. 5 (6): 649-55  LABORATORY DATA:  Lab Results  Component Value Date   WBC 6.2 04/16/2023   HGB 14.9 04/16/2023   HCT 43.8 04/16/2023   MCV 94.4 04/16/2023   PLT 195 04/16/2023   NEUTROABS 3.9 04/16/2023   Lab Results  Component Value Date   NA 136 04/16/2023   K 4.4 04/16/2023   CL 102 04/16/2023   CO2 25 04/16/2023   GLUCOSE 102 (H) 04/16/2023   BUN 18 04/16/2023   CREATININE 1.05 04/16/2023   CALCIUM 9.3 04/16/2023      RADIOGRAPHY: NM PET Image Restag (PS) Skull Base To Thigh Result Date: 05/21/2023 CLINICAL DATA:  Subsequent treatment strategy for non-small-cell lung cancer. Restaging. Biopsy 2 months ago. EXAM: NUCLEAR MEDICINE PET SKULL BASE TO THIGH TECHNIQUE: 9.2 mCi F-18 FDG was injected intravenously. Full-ring PET imaging was performed from the skull base to thigh after the radiotracer. CT data was obtained and used for attenuation correction and anatomic localization. Fasting blood glucose: 100 mg/dl COMPARISON:  13/10/6576 chest CT.  PET of 09/20/2022 FINDINGS: Mediastinal blood pool activity: SUV max 2.1 Liver activity: SUV max NA NECK: Deep left parotid nodule again identified at 4 mm and a S.U.V. max of 5.5, similar on 17/202. No cervical nodal hypermetabolism. Incidental CT findings: No cervical adenopathy. CHEST: Again identified is right upper lobe presumably radiation induced consolidation and low-level hypermetabolism centrally. The right hilar hypermetabolism has resolved. A subcarinal node measures 5 mm and a S.U.V. max of 3.2 on 63/202. This is relatively similar in size but not hypermetabolic on the prior. The inferior right upper lobe spiculated nodule on the 04/23/2023 diagnostic CT is hypermetabolic. Example at 1.2 cm and a S.U.V. max of 5.0 on 62/202. Incidental  CT findings: Centrilobular emphysema. Deferred to recent diagnostic CT. Aortic and coronary artery calcification. Aortic valve calcification. Radiation fiducials within the left upper lobe, adjacent to a 7 mm nodule which is not significantly hypermetabolic but at the low end of PET resolution. ABDOMEN/PELVIS: No abdominopelvic parenchymal or nodal hypermetabolism. Incidental CT findings: Normal adrenal glands. Punctate left renal collecting  system calculus. Macrolobulated left-sided renal dominant cystic lesion or adjacent cysts at up to 7.7 cm. Present back to 2011 and does not warrant specific imaging follow-up. Hepatic cysts. Radiation seeds in the prostate. SKELETON: No abnormal marrow activity. Incidental CT findings: None. IMPRESSION: 1. The inferior right upper lobe 1.2 cm pulmonary nodule on the 04/23/2023 chest CT is hypermetabolic, consistent with metastasis or metachronous primary. 2. Although the right hilar nodal hypermetabolism has resolved, there is a newly hypermetabolic small subcarinal node, favoring nodal metastasis. 3. No distant hypermetabolic metastasis. 4. Similar left parotid hypermetabolic nodule, likely a primary neoplasm such as a Warthin's tumor. 5. Incidental findings, including: Aortic atherosclerosis (ICD10-I70.0), coronary artery atherosclerosis and emphysema (ICD10-J43.9). Left nephrolithiasis. Electronically Signed   By: Jeronimo Greaves M.D.   On: 05/21/2023 09:05      IMPRESSION:  Bilateral lung cancers: Squamous cell carcinoma of the right upper lobe and left upper lobe diagnosed in September/October of 2024, s/p XRT - Now with a new hypermetabolic RUL nodule concerning for malignancy (February 2025)   History of squamous cell carcinoma of right lung diagnosed in 2013, s/p concurrent chemoradiation   Today I reviewed the results of the patient's PET scan with the patient and his son.  We discussed radiation treatments to the hypermetabolic 1.2 cm nodule in the inferior right  upper lobe.  We also discussed that there is question of the small subcarinal lymph node.  Given the small size of this node and his previous chest radiation treatment down in [place] I would recommend  following this area on serial scans.  He would be a good candidate for SBRT directed at the PET positive pulmonary nodule in the right upper lobe.  Anticipate 3-5 fractions depending on potential overlap with his previous SBRT treatments.    We discussed the available radiation techniques, and focused on the details of logistics and delivery.  We reviewed the anticipated acute and late sequelae associated with radiation in this setting.  The patient was encouraged to ask questions that I answered to the best of my ability.  A patient consent form was discussed and signed.  We retained a copy for our records.  The patient would like to proceed with radiation and will be scheduled for CT simulation.  PLAN: He will return later this week for CT simulation.  Anticipate treatment starting approximately a week and a half.  He will receive 3-5 SBRT treatments directed at the PET positive nodule in the right upper lobe.   45 minutes of total time was spent for this patient encounter, including preparation, face-to-face counseling with the patient and coordination of care, physical exam, and documentation of the encounter.   ------------------------------------------------  Billie Lade, PhD, MD  This document serves as a record of services personally performed by Antony Blackbird, MD. It was created on his behalf by Neena Rhymes, a trained medical scribe. The creation of this record is based on the scribe's personal observations and the provider's statements to them. This document has been checked and approved by the attending provider.

## 2023-06-10 ENCOUNTER — Encounter: Payer: Self-pay | Admitting: Radiation Oncology

## 2023-06-10 ENCOUNTER — Ambulatory Visit
Admission: RE | Admit: 2023-06-10 | Discharge: 2023-06-10 | Disposition: A | Discharge status: Home / Self Care | Source: Ambulatory Visit | Attending: Radiation Oncology | Admitting: Radiation Oncology

## 2023-06-10 VITALS — BP 121/63 | HR 89 | Temp 97.3°F | Resp 18 | Ht 60.0 in | Wt 165.6 lb

## 2023-06-10 DIAGNOSIS — C3491 Malignant neoplasm of unspecified part of right bronchus or lung: Secondary | ICD-10-CM

## 2023-06-10 DIAGNOSIS — Z923 Personal history of irradiation: Secondary | ICD-10-CM | POA: Insufficient documentation

## 2023-06-10 DIAGNOSIS — C61 Malignant neoplasm of prostate: Secondary | ICD-10-CM | POA: Insufficient documentation

## 2023-06-10 DIAGNOSIS — C3411 Malignant neoplasm of upper lobe, right bronchus or lung: Secondary | ICD-10-CM | POA: Diagnosis not present

## 2023-06-10 DIAGNOSIS — J449 Chronic obstructive pulmonary disease, unspecified: Secondary | ICD-10-CM | POA: Diagnosis not present

## 2023-06-10 DIAGNOSIS — C3412 Malignant neoplasm of upper lobe, left bronchus or lung: Secondary | ICD-10-CM | POA: Insufficient documentation

## 2023-06-10 DIAGNOSIS — Z7982 Long term (current) use of aspirin: Secondary | ICD-10-CM | POA: Insufficient documentation

## 2023-06-10 DIAGNOSIS — K5900 Constipation, unspecified: Secondary | ICD-10-CM | POA: Diagnosis not present

## 2023-06-10 DIAGNOSIS — Z809 Family history of malignant neoplasm, unspecified: Secondary | ICD-10-CM | POA: Insufficient documentation

## 2023-06-10 DIAGNOSIS — F17210 Nicotine dependence, cigarettes, uncomplicated: Secondary | ICD-10-CM | POA: Insufficient documentation

## 2023-06-10 DIAGNOSIS — I2510 Atherosclerotic heart disease of native coronary artery without angina pectoris: Secondary | ICD-10-CM | POA: Insufficient documentation

## 2023-06-10 DIAGNOSIS — M51369 Other intervertebral disc degeneration, lumbar region without mention of lumbar back pain or lower extremity pain: Secondary | ICD-10-CM | POA: Insufficient documentation

## 2023-06-10 DIAGNOSIS — Z860101 Personal history of adenomatous and serrated colon polyps: Secondary | ICD-10-CM | POA: Insufficient documentation

## 2023-06-10 DIAGNOSIS — E785 Hyperlipidemia, unspecified: Secondary | ICD-10-CM | POA: Diagnosis not present

## 2023-06-10 DIAGNOSIS — Z85828 Personal history of other malignant neoplasm of skin: Secondary | ICD-10-CM | POA: Insufficient documentation

## 2023-06-10 DIAGNOSIS — Z79899 Other long term (current) drug therapy: Secondary | ICD-10-CM | POA: Insufficient documentation

## 2023-06-10 DIAGNOSIS — M5030 Other cervical disc degeneration, unspecified cervical region: Secondary | ICD-10-CM | POA: Diagnosis not present

## 2023-06-10 DIAGNOSIS — I10 Essential (primary) hypertension: Secondary | ICD-10-CM | POA: Insufficient documentation

## 2023-06-10 DIAGNOSIS — C3432 Malignant neoplasm of lower lobe, left bronchus or lung: Secondary | ICD-10-CM | POA: Diagnosis not present

## 2023-06-10 DIAGNOSIS — I700 Atherosclerosis of aorta: Secondary | ICD-10-CM | POA: Insufficient documentation

## 2023-06-11 DIAGNOSIS — R7301 Impaired fasting glucose: Secondary | ICD-10-CM | POA: Diagnosis not present

## 2023-06-11 DIAGNOSIS — E782 Mixed hyperlipidemia: Secondary | ICD-10-CM | POA: Diagnosis not present

## 2023-06-14 ENCOUNTER — Ambulatory Visit
Admission: RE | Admit: 2023-06-14 | Discharge: 2023-06-14 | Disposition: A | Discharge status: Home / Self Care | Source: Ambulatory Visit | Attending: Radiation Oncology | Admitting: Radiation Oncology

## 2023-06-14 DIAGNOSIS — C3490 Malignant neoplasm of unspecified part of unspecified bronchus or lung: Secondary | ICD-10-CM

## 2023-06-14 DIAGNOSIS — F17210 Nicotine dependence, cigarettes, uncomplicated: Secondary | ICD-10-CM | POA: Diagnosis not present

## 2023-06-14 DIAGNOSIS — C3411 Malignant neoplasm of upper lobe, right bronchus or lung: Secondary | ICD-10-CM | POA: Diagnosis not present

## 2023-06-18 DIAGNOSIS — H3530 Unspecified macular degeneration: Secondary | ICD-10-CM | POA: Diagnosis not present

## 2023-06-18 DIAGNOSIS — R944 Abnormal results of kidney function studies: Secondary | ICD-10-CM | POA: Diagnosis not present

## 2023-06-18 DIAGNOSIS — E782 Mixed hyperlipidemia: Secondary | ICD-10-CM | POA: Diagnosis not present

## 2023-06-18 DIAGNOSIS — J449 Chronic obstructive pulmonary disease, unspecified: Secondary | ICD-10-CM | POA: Diagnosis not present

## 2023-06-18 DIAGNOSIS — I2510 Atherosclerotic heart disease of native coronary artery without angina pectoris: Secondary | ICD-10-CM | POA: Diagnosis not present

## 2023-06-18 DIAGNOSIS — N401 Enlarged prostate with lower urinary tract symptoms: Secondary | ICD-10-CM | POA: Diagnosis not present

## 2023-06-18 DIAGNOSIS — G44009 Cluster headache syndrome, unspecified, not intractable: Secondary | ICD-10-CM | POA: Diagnosis not present

## 2023-06-18 DIAGNOSIS — D126 Benign neoplasm of colon, unspecified: Secondary | ICD-10-CM | POA: Diagnosis not present

## 2023-06-18 DIAGNOSIS — C3411 Malignant neoplasm of upper lobe, right bronchus or lung: Secondary | ICD-10-CM | POA: Diagnosis not present

## 2023-06-18 DIAGNOSIS — K5909 Other constipation: Secondary | ICD-10-CM | POA: Diagnosis not present

## 2023-06-18 DIAGNOSIS — I998 Other disorder of circulatory system: Secondary | ICD-10-CM | POA: Diagnosis not present

## 2023-06-18 DIAGNOSIS — R7301 Impaired fasting glucose: Secondary | ICD-10-CM | POA: Diagnosis not present

## 2023-06-20 DIAGNOSIS — F17210 Nicotine dependence, cigarettes, uncomplicated: Secondary | ICD-10-CM | POA: Diagnosis not present

## 2023-06-20 DIAGNOSIS — C3411 Malignant neoplasm of upper lobe, right bronchus or lung: Secondary | ICD-10-CM | POA: Diagnosis not present

## 2023-06-25 ENCOUNTER — Other Ambulatory Visit: Payer: Self-pay

## 2023-06-25 ENCOUNTER — Ambulatory Visit
Admission: RE | Admit: 2023-06-25 | Discharge: 2023-06-25 | Disposition: A | Discharge status: Home / Self Care | Source: Ambulatory Visit | Attending: Radiation Oncology | Admitting: Radiation Oncology

## 2023-06-25 DIAGNOSIS — Z510 Encounter for antineoplastic radiation therapy: Secondary | ICD-10-CM | POA: Insufficient documentation

## 2023-06-25 DIAGNOSIS — C3490 Malignant neoplasm of unspecified part of unspecified bronchus or lung: Secondary | ICD-10-CM | POA: Insufficient documentation

## 2023-06-25 DIAGNOSIS — C3411 Malignant neoplasm of upper lobe, right bronchus or lung: Secondary | ICD-10-CM | POA: Diagnosis not present

## 2023-06-25 LAB — RAD ONC ARIA SESSION SUMMARY
Course Elapsed Days: 0
Plan Fractions Treated to Date: 1
Plan Prescribed Dose Per Fraction: 18 Gy
Plan Total Fractions Prescribed: 3
Plan Total Prescribed Dose: 54 Gy
Reference Point Dosage Given to Date: 18 Gy
Reference Point Session Dosage Given: 18 Gy
Session Number: 1

## 2023-06-26 ENCOUNTER — Ambulatory Visit: Admitting: Radiation Oncology

## 2023-06-27 ENCOUNTER — Other Ambulatory Visit: Payer: Self-pay

## 2023-06-27 ENCOUNTER — Ambulatory Visit
Admission: RE | Admit: 2023-06-27 | Discharge: 2023-06-27 | Disposition: A | Discharge status: Home / Self Care | Source: Ambulatory Visit | Attending: Radiation Oncology | Admitting: Radiation Oncology

## 2023-06-27 DIAGNOSIS — C3411 Malignant neoplasm of upper lobe, right bronchus or lung: Secondary | ICD-10-CM | POA: Diagnosis not present

## 2023-06-27 DIAGNOSIS — Z510 Encounter for antineoplastic radiation therapy: Secondary | ICD-10-CM | POA: Diagnosis not present

## 2023-06-27 LAB — RAD ONC ARIA SESSION SUMMARY
Course Elapsed Days: 2
Plan Fractions Treated to Date: 2
Plan Prescribed Dose Per Fraction: 18 Gy
Plan Total Fractions Prescribed: 3
Plan Total Prescribed Dose: 54 Gy
Reference Point Dosage Given to Date: 36 Gy
Reference Point Session Dosage Given: 18 Gy
Session Number: 2

## 2023-07-02 ENCOUNTER — Ambulatory Visit
Admission: RE | Admit: 2023-07-02 | Discharge: 2023-07-02 | Disposition: A | Discharge status: Home / Self Care | Source: Ambulatory Visit | Attending: Radiation Oncology | Admitting: Radiation Oncology

## 2023-07-02 ENCOUNTER — Other Ambulatory Visit: Payer: Self-pay

## 2023-07-02 DIAGNOSIS — C3490 Malignant neoplasm of unspecified part of unspecified bronchus or lung: Secondary | ICD-10-CM

## 2023-07-02 DIAGNOSIS — Z510 Encounter for antineoplastic radiation therapy: Secondary | ICD-10-CM | POA: Diagnosis not present

## 2023-07-02 DIAGNOSIS — F17210 Nicotine dependence, cigarettes, uncomplicated: Secondary | ICD-10-CM | POA: Diagnosis not present

## 2023-07-02 DIAGNOSIS — C3411 Malignant neoplasm of upper lobe, right bronchus or lung: Secondary | ICD-10-CM | POA: Diagnosis not present

## 2023-07-02 LAB — RAD ONC ARIA SESSION SUMMARY
Course Elapsed Days: 7
Plan Fractions Treated to Date: 3
Plan Prescribed Dose Per Fraction: 18 Gy
Plan Total Fractions Prescribed: 3
Plan Total Prescribed Dose: 54 Gy
Reference Point Dosage Given to Date: 54 Gy
Reference Point Session Dosage Given: 18 Gy
Session Number: 3

## 2023-07-03 NOTE — Radiation Completion Notes (Addendum)
  Radiation Oncology         [phone] ________________________________  Name: Eric Hinton MRN: [id number]  Date of Service: 07/02/2023  DOB: Apr 11, 1940  End of Treatment Note  Diagnosis: Malignant neoplasm of upper lobe, right bronchus or lung Staging on 2015-07-15: Squamous cell carcinoma of right lung (HCC) T=TX, N=N2, M=M0 Intent: Curative     ==========DELIVERED PLANS==========  First Treatment Date: 2023-06-25 Last Treatment Date: 2023-07-02   Plan Name: Lung_R_SBRT Site: Lung, Right Technique: SBRT/SRT-IMRT Mode: Photon Dose Per Fraction: 18 Gy Prescribed Dose (Delivered / Prescribed): 54 Gy / 54 Gy Prescribed Fxs (Delivered / Prescribed): 3 / 3     ====================================   The patient tolerated radiation. He developed mild fatigue and some pruritus to the chest.   The patient will return in one month and will continue follow up with Dr. Katragadda as well.      Amiel Kalata, PA-C

## 2023-07-12 NOTE — Addendum Note (Signed)
 Encounter addended by: Pearlene Bouchard, PA-C on: 07/12/2023 11:17 AM  Actions taken: Clinical Note Signed

## 2023-07-25 ENCOUNTER — Encounter: Payer: Self-pay | Admitting: Radiation Oncology

## 2023-07-29 ENCOUNTER — Ambulatory Visit: Admission: RE | Admit: 2023-07-29 | Source: Ambulatory Visit | Admitting: Radiation Oncology

## 2023-07-29 HISTORY — DX: Personal history of irradiation: Z92.3

## 2023-09-17 ENCOUNTER — Inpatient Hospital Stay: Payer: Medicare Other | Attending: Hematology | Admitting: Hematology

## 2023-09-17 ENCOUNTER — Ambulatory Visit (HOSPITAL_COMMUNITY)
Admission: RE | Admit: 2023-09-17 | Discharge: 2023-09-17 | Disposition: A | Discharge status: Home / Self Care | Payer: Medicare Other | Source: Ambulatory Visit | Attending: Hematology | Admitting: Hematology

## 2023-09-17 DIAGNOSIS — Z8546 Personal history of malignant neoplasm of prostate: Secondary | ICD-10-CM | POA: Insufficient documentation

## 2023-09-17 DIAGNOSIS — C3491 Malignant neoplasm of unspecified part of right bronchus or lung: Secondary | ICD-10-CM

## 2023-09-17 DIAGNOSIS — Z85118 Personal history of other malignant neoplasm of bronchus and lung: Secondary | ICD-10-CM | POA: Diagnosis not present

## 2023-09-17 DIAGNOSIS — J439 Emphysema, unspecified: Secondary | ICD-10-CM | POA: Diagnosis not present

## 2023-09-17 DIAGNOSIS — J90 Pleural effusion, not elsewhere classified: Secondary | ICD-10-CM | POA: Diagnosis not present

## 2023-09-17 DIAGNOSIS — R590 Localized enlarged lymph nodes: Secondary | ICD-10-CM | POA: Diagnosis not present

## 2023-09-17 LAB — COMPREHENSIVE METABOLIC PANEL WITH GFR
ALT: 17 U/L (ref 0–44)
AST: 14 U/L — ABNORMAL LOW (ref 15–41)
Albumin: 3.6 g/dL (ref 3.5–5.0)
Alkaline Phosphatase: 82 U/L (ref 38–126)
Anion gap: 11 (ref 5–15)
BUN: 20 mg/dL (ref 8–23)
CO2: 22 mmol/L (ref 22–32)
Calcium: 9.3 mg/dL (ref 8.9–10.3)
Chloride: 104 mmol/L (ref 98–111)
Creatinine, Ser: 1.01 mg/dL (ref 0.61–1.24)
GFR, Estimated: >60 mL/min (ref >60)
Glucose, Bld: 109 mg/dL — ABNORMAL HIGH (ref 70–99)
Potassium: 4.3 mmol/L (ref 3.5–5.1)
Sodium: 137 mmol/L (ref 135–145)
Total Bilirubin: 0.4 mg/dL (ref 0.0–1.2)
Total Protein: 7.1 g/dL (ref 6.5–8.1)

## 2023-09-17 LAB — CBC WITH DIFFERENTIAL/PLATELET
Abs Immature Granulocytes: 0.02 10*3/uL (ref 0.00–0.07)
Basophils Absolute: 0.1 10*3/uL (ref 0.0–0.1)
Basophils Relative: 1 %
Eosinophils Absolute: 0.1 10*3/uL (ref 0.0–0.5)
Eosinophils Relative: 2 %
HCT: 43.9 % (ref 39.0–52.0)
Hemoglobin: 14.6 g/dL (ref 13.0–17.0)
Immature Granulocytes: 0 %
Lymphocytes Relative: 21 %
Lymphs Abs: 1.2 10*3/uL (ref 0.7–4.0)
MCH: 31.3 pg (ref 26.0–34.0)
MCHC: 33.3 g/dL (ref 30.0–36.0)
MCV: 94.2 fL (ref 80.0–100.0)
Monocytes Absolute: 0.5 10*3/uL (ref 0.1–1.0)
Monocytes Relative: 10 %
Neutro Abs: 3.6 10*3/uL (ref 1.7–7.7)
Neutrophils Relative %: 66 %
Platelets: 219 10*3/uL (ref 150–400)
RBC: 4.66 MIL/uL (ref 4.22–5.81)
RDW: 12.9 % (ref 11.5–15.5)
WBC: 5.5 10*3/uL (ref 4.0–10.5)
nRBC: 0 % (ref 0.0–0.2)

## 2023-09-17 MED ORDER — IOHEXOL 300 MG/ML  SOLN
75.0000 mL | Freq: Once | INTRAMUSCULAR | Status: AC | PRN
  Administered 2023-09-17: 75 mL via INTRAVENOUS

## 2023-09-18 NOTE — Progress Notes (Signed)
 Lab no charge

## 2023-09-24 ENCOUNTER — Inpatient Hospital Stay: Payer: Medicare Other | Attending: Hematology | Admitting: Hematology

## 2023-09-24 VITALS — BP 129/69 | HR 96 | Temp 98.5°F | Resp 20 | Wt 160.1 lb

## 2023-09-24 DIAGNOSIS — C3491 Malignant neoplasm of unspecified part of right bronchus or lung: Secondary | ICD-10-CM | POA: Diagnosis not present

## 2023-09-24 DIAGNOSIS — Z9221 Personal history of antineoplastic chemotherapy: Secondary | ICD-10-CM | POA: Diagnosis not present

## 2023-09-24 DIAGNOSIS — Z85118 Personal history of other malignant neoplasm of bronchus and lung: Secondary | ICD-10-CM | POA: Insufficient documentation

## 2023-09-24 DIAGNOSIS — Z8546 Personal history of malignant neoplasm of prostate: Secondary | ICD-10-CM | POA: Insufficient documentation

## 2023-09-24 DIAGNOSIS — Z923 Personal history of irradiation: Secondary | ICD-10-CM | POA: Diagnosis not present

## 2023-09-24 NOTE — Patient Instructions (Addendum)
 [facility] at [facility] Discharge Instructions   You were seen and examined today by Dr. Rogers.  He reviewed the results of your lab work which are normal/stable.   He reviewed the results of your CT scan. It is showing the spots in the lungs have shrunk significantly since radiation treatment. There is one lymph node that has grown in size. We will refer you back to Dr. Shannon for radiation treatment to this area.   We will see you back in 5 months. We will repeat a scan prior to this visit.    Return as scheduled.    Thank you for choosing [facility] at [facility] to provide your oncology and hematology care.  To afford each patient quality time with our provider, please arrive at least 15 minutes before your scheduled appointment time.   If you have a lab appointment with the Cancer Center please come in thru the Main Entrance and check in at the main information desk.  You need to re-schedule your appointment should you arrive 10 or more minutes late.  We strive to give you quality time with our providers, and arriving late affects you and other patients whose appointments are after yours.  Also, if you no show three or more times for appointments you may be dismissed from the clinic at the providers discretion.     Again, thank you for choosing [facility].  Our hope is that these requests will decrease the amount of time that you wait before being seen by our physicians.       _____________________________________________________________  Should you have questions after your visit to [facility], please contact our office at [phone] and follow the prompts.  Our office hours are 8:00 a.m. and 4:30 p.m. Monday - Friday.  Please note that voicemails left after 4:00 p.m. may not be returned until the following business day.  We are closed weekends and major holidays.  You do have access to a nurse 24-7,  just call the main number to the clinic [phone] and do not press any options, hold on the line and a nurse will answer the phone.    For prescription refill requests, have your pharmacy contact our office and allow 72 hours.    Due to Covid, you will need to wear a mask upon entering the hospital. If you do not have a mask, a mask will be given to you at the Main Entrance upon arrival. For doctor visits, patients may have 1 support person age 46 or older with them. For treatment visits, patients can not have anyone with them due to social distancing guidelines and our immunocompromised population.

## 2023-09-24 NOTE — Progress Notes (Signed)
 [facility] [address] [place] [address]    Clinic Day:  09/24/23   Referring physician: [name], MD  Patient Care Team: Eric Norleen PEDLAR, MD as PCP - General (Internal Medicine) Eric Hai, MD as Medical Oncologist (Medical Oncology)   ASSESSMENT & PLAN:   Assessment: 1. Stage IIIa (T1 aN2 M0) squamous cell carcinoma of right lung: - Diagnosed in #2013, status post concurrent chemoradiation therapy with carboplatin and paclitaxel weekly, treated in [facility], [place] . - He is being followed by Eric Hinton in [place] once a year with scans. - Last CT chest with contrast on 02/20/2021 did not show any evidence of recurrence. - PET scan (09/20/2022): Left upper lobe 1.2 cm nodule hypermetabolic with SUV 2.6.  Mixed attenuation posterior right upper lobe nodule mildly hypermetabolic at 11 mm, SUV 1.8.  Enlarging right hilar lymph node 1.4 cm with SUV 3.5. - He underwent bronchoscopy and biopsy on 12/11/2022. - Lymph node station 7 FNA was benign.  11R FNA was benign. - LUL FNA consistent with squamous cell carcinoma.  RUL FNA was consistent with squamous cell carcinoma.  RUL target 3 lesion was benign. - SBRT to the left upper lobe lung, right upper lung 2 lesions from 01/22/2023 through 01/28/2023 - SBRT to the right upper lobe lung lesion, 18 Gray in 3 fractions from 06/25/2023 through 07/02/2023  2. Social/family history: - He is recently widowed and lives by himself. He is legally blind. He is retired from [employer] and also worked as a Audiological scientist at [employer]. - Current active smoker, smokes 2 to 3 packs/day for the last 74 years. - Mother had cancer in her abdomen.  Father and 4 paternal uncles had prostate cancer.  3.  Prostate cancer: - Diagnosed in 2013 and had seed implant and Calypso treatment with 5 treatments of radiation.  He reports that his most recent PSA was undetectable.    Plan: 1.  Squamous cell  carcinoma of the right upper lobe of the lung and left upper lobe of the lung: - CT chest in January 2025 and PET scan in February 2025 showed inferior right upper lobe 1.2 cm lung nodule hypermetabolic consistent with malignancy.  Previously seen 5 mm subcarinal lymph node was hypermetabolic with SUV 3.2. - He met with Eric Hinton and underwent SBRT to the right upper lobe lesion.  Given his previous history of radiation, it was recommended to watch subcarinal node. - He has tolerated radiation very well with almost no side effects. - Reviewed labs from 09/17/2022: Normal LFTs.  CBC normal. - CT chest (09/17/2023): Short axis of the subcarinal lymph node in January measured 5 mm.  On the current scan it measures 14 mm.  Stable prominent right hilar node.  Right upper lobe nodule which was radiated has markedly improved.  Other areas are stable. - I have recommended follow-up with Eric Hinton and discuss SBRT to the subcarinal lymph node.  We will arrange for follow-up CT scan 3 to 4 months after SBRT.    2.  Prostate cancer: - Genetics evaluation was recommended previously because of personal and family history.    Orders Placed This Encounter  Procedures   CT CHEST W CONTRAST    Standing Status:   Future    Expected Date:   12/25/2023    Expiration Date:   09/23/2024    If indicated for the ordered procedure, I authorize the administration of contrast media per  Radiology protocol:   Yes    Does the patient have a contrast media/X-ray dye allergy?:   No    Preferred imaging location?:   [facility] R Teague,acting as a scribe for Eric Stands, MD.,have documented all relevant documentation on the behalf of Eric Stands, MD,as directed by  Eric Stands, MD while in the presence of Eric Stands, MD.  I, Eric Stands MD, have reviewed the above documentation for accuracy and completeness, and I agree with the above.      Eric Stands, MD   7/1/20255:37 PM  CHIEF COMPLAINT:   Diagnosis: right lung squamous cell carcinoma    Cancer Staging  Squamous cell carcinoma of right lung (HCC) Staging form: Lung, AJCC 7th Edition - Clinical: Stage Unknown (TX, N2, M0) - Signed by Eric Hinton on 07/15/2015    Prior Therapy: chemoradiation therapy   Current Therapy:  surveillance    HISTORY OF PRESENT ILLNESS:   Oncology History Overview Note  H/O Stage IIIA poorly differentiated squamous cell carcinoma of right lung (TXN2M0) treated with concurrent chemoradiation consisting of weekly Carbo/Taxo (02/28/2012- 04/17/2012) complicated by hospitalizations (2) and issues with thrombocytopenia resulting in completing 6/8 cycles of chemotherapy.  He underwent XRT from 02/25/2012- 04/29/2012.   Squamous cell carcinoma of right lung (HCC)  12/03/2011 PET scan   Persistent focal uptake in the rectoanal bowel, benign inflammatory possible neoplastic process cannot be excluded. Multiple enlarging hypermetabolic mediastinal lymph nodes concerning for malignancy. RML density favoring benign etiology   12/13/2011 Procedure   Colonoscopy found tubular adenomas at 75 cm and 40 cm   01/14/2012 Imaging   CT chest-no significant interval change in mediastinal lymphadenopathy when compared to 11/2011.  Spiculated opacity in the right upper lobe not significantly changed from 11/2011. Stable RML noncalcified on her nodules. Diffuse emphysema.   02/08/2012 Procedure   Bronchoscopy revealing chronic bronchitis, multiple mucous plugs are present and lavaged out from the lung. Needle aspiration of mediastinal lymph node performed.   02/08/2012 Pathology Results   Needle aspiration of mediastinal lymph node-Poorly differentiated squamous cell carcinoma   02/19/2012 Imaging   MRI brain-negative   02/19/2012 Cancer Staging   Clinically Stage IIIA   02/25/2012 - 04/29/2012 Radiation Therapy   Eric Hinton   02/28/2012 - 04/17/2012  Chemotherapy   Weekly Carbo/Taxol requiring dose reduction due to thrombocytopenia/hospitalization.   03/27/2012 Treatment Plan Change   Treatment held due to thrombocytopenia/hospitalization   04/10/2012 Treatment Plan Change   Treatment held due to thrombocytopenia/hospitalization   04/17/2012 Treatment Plan Change   Patient refused any further chemotherapy.   05/19/2015 Imaging   CT chest- Mild interval increase in the prominence of chronic irregular densities in the anterior right mid lung. Most compatible with areas of scarring and atelectasis. Recommend CT follow-up in 3-6 months. Mildly prominent right hilar lymph nodes.   08/17/2015 Imaging   CT chest- Areas of architectural distortion, bronchiectasis + consolidation in the RUL + RML are presumably treatment related. Given absence of a recent prior comparison examination, it is difficult to definitively exclude residual disease.      INTERVAL HISTORY:   Eric Hinton is a 83 y.o. male presenting to clinic today for follow up of right lung squamous cell carcinoma. He was last seen by me on 05/21/23.  Since his last visit, he completed SBRT treatment. CT chest from 09/17/23 showed: Further enlargement of the subcarinal lymph node previously showing uptake. Area of potential worsening disease. Stable  prominent right hilar node. This was not hypermetabolic. No new areas of nodal enlargement at this time. The hypermetabolic nodule along the inferior right upper lobe has markedly improved. There are several other areas of lung nodularity which are similar to previous. Bandlike nodular areas of opacity in the right upper lobe and middle lobe are stable. Persistent wall thickening along the esophagus. Slight increase in the tiny right pleural effusion. Aortic Atherosclerosis and Emphysema.   Today, he states that he is doing well overall. His appetite level is at 100%. His energy level is at 25%. He is accompanied by a friend.   Colter tolerated SBRT  treatment well. He has a follow-up with Eric Hinton on 10/10/23.   PAST MEDICAL HISTORY:   Past Medical History: Past Medical History:  Diagnosis Date   Arthritis    Carotid artery disease (HCC)    Status post bilateral CEA - Dr. Oris   Complication of anesthesia    unable to void afterwards   Constipation    COPD (chronic obstructive pulmonary disease) (HCC)    DDD (degenerative disc disease), cervical    Degenerative disc disease, lumbar    Essential hypertension    History of radiation therapy    Right lung-06/25/23-07/02/23- Dr. Lynwood Eric Hinton   Hyperlipidemia    Macular degeneration    Prostate cancer [facility])    Skin cancer, basal cell    Squamous cell lung cancer (HCC) 07/15/2015   Status post chemotherapy and XRT - Eric Hinton   Tubular adenoma of colon     Surgical History: Past Surgical History:  Procedure Laterality Date   BRONCHIAL BIOPSY  12/11/2022   Procedure: BRONCHIAL BIOPSIES;  Surgeon: Shelah Lamar RAMAN, MD;  Location: [facility] ENDOSCOPY;  Service: Pulmonary;;   BRONCHIAL BRUSHINGS  12/11/2022   Procedure: BRONCHIAL BRUSHINGS;  Surgeon: Shelah Lamar RAMAN, MD;  Location: [facility] ENDOSCOPY;  Service: Pulmonary;;   BRONCHIAL NEEDLE ASPIRATION BIOPSY  12/11/2022   Procedure: BRONCHIAL NEEDLE ASPIRATION BIOPSIES;  Surgeon: Shelah Lamar RAMAN, MD;  Location: [facility] ENDOSCOPY;  Service: Pulmonary;;   BRONCHIAL WASHINGS  12/11/2022   Procedure: BRONCHIAL WASHINGS;  Surgeon: Shelah Lamar RAMAN, MD;  Location: [facility] ENDOSCOPY;  Service: Pulmonary;;   CATARACT EXTRACTION W/ INTRAOCULAR LENS  IMPLANT, BILATERAL Bilateral    COLONOSCOPY     COLONOSCOPY WITH PROPOFOL  N/A 05/17/2017   Procedure: COLONOSCOPY WITH PROPOFOL ;  Surgeon: Golda Claudis PENNER, MD;  Location: [facility] ENDO SUITE;  Service: Endoscopy;  Laterality: N/A;  10:15   ENDARTERECTOMY Right 08/29/2015   Procedure: RIGHT CAROTID ENDARTERECTOMY WITH PATCH ANGIOPLASTY;  Surgeon: Krystal JULIANNA Oris, MD;  Location: [facility] OR;  Service: Vascular;  Laterality: Right;    ENDARTERECTOMY Left 10/07/2015   Procedure: LEFT CAROTID ARTERY ENDARTERECTOMY;  Surgeon: Krystal JULIANNA Oris, MD;  Location: [facility] OR;  Service: Vascular;  Laterality: Left;   FIDUCIAL MARKER PLACEMENT  12/11/2022   Procedure: FIDUCIAL MARKER PLACEMENT;  Surgeon: Shelah Lamar RAMAN, MD;  Location: [facility] ENDOSCOPY;  Service: Pulmonary;;   FINE NEEDLE ASPIRATION  12/11/2022   Procedure: FINE NEEDLE ASPIRATION;  Surgeon: Shelah Lamar RAMAN, MD;  Location: [facility] ENDOSCOPY;  Service: Pulmonary;;   NOSE SURGERY     rebulit my nose; related to skin cancer   PATCH ANGIOPLASTY Left 10/07/2015   Procedure: With HEMASHIELD PLATINUM PATCH ANGIOPLASTY;  Surgeon: Krystal JULIANNA Oris, MD;  Location: [facility] OR;  Service: Vascular;  Laterality: Left;   POLYPECTOMY  05/17/2017   Procedure: POLYPECTOMY;  Surgeon: Golda Claudis PENNER, MD;  Location: [facility] ENDO SUITE;  Service: Endoscopy;;  colon    PROSTATE BIOPSY     SKIN CANCER EXCISION Right    neck   TONSILLECTOMY     VIDEO BRONCHOSCOPY WITH ENDOBRONCHIAL ULTRASOUND N/A 12/11/2022   Procedure: VIDEO BRONCHOSCOPY WITH ENDOBRONCHIAL ULTRASOUND;  Surgeon: Shelah Lamar RAMAN, MD;  Location: [facility] ENDOSCOPY;  Service: Pulmonary;  Laterality: N/A;    Social History: Social History   Socioeconomic History   Marital status: Married    Spouse name: Not on file   Number of children: Not on file   Years of education: Not on file   Highest education level: Not on file  Occupational History   Not on file  Tobacco Use   Smoking status: Every Day    Current packs/day: 2.00    Average packs/day: 2.0 packs/day for 77.5 years (154.9 ttl pk-yrs)    Types: Cigarettes    Start date: 04[birth year]/1946   Smokeless tobacco: Never  Vaping Use   Vaping status: Never Used  Substance and Sexual Activity   Alcohol use: Not Currently    Alcohol/week: 35.0 standard drinks of alcohol    Types: 35 Cans of beer per week   Drug use: No   Sexual activity: Not on file  Other Topics Concern   Not on file  Social History  Narrative   Not on file   Social Drivers of Health   Financial Resource Strain: Not on file  Food Insecurity: No Food Insecurity (06/10/2023)   Hunger Vital Sign    Worried About Running Out of Food in the Last Year: Never true    Ran Out of Food in the Last Year: Never true  Transportation Needs: No Transportation Needs (06/10/2023)   PRAPARE - Administrator, Civil Service (Medical): No    Lack of Transportation (Non-Medical): No  Physical Activity: Not on file  Stress: Not on file  Social Connections: Not on file  Intimate Partner Violence: Not At Risk (06/10/2023)   Humiliation, Afraid, Rape, and Kick questionnaire    Fear of Current or Ex-Partner: No    Emotionally Abused: No    Physically Abused: No    Sexually Abused: No    Family History: Family History  Problem Relation Age of Onset   Cancer Mother    Stroke Father    Heart disease Father    Diabetes Mellitus II Sister     Current Medications:  Current Outpatient Medications:    albuterol (VENTOLIN HFA) 108 (90 Base) MCG/ACT inhaler, Inhale 1 puff into the lungs every 6 (six) hours as needed., Disp: , Rfl:    aspirin  325 MG tablet, Take 1 tablet (325 mg total) by mouth daily., Disp: , Rfl:    atorvastatin  (LIPITOR) 10 MG tablet, Take 10 mg by mouth daily., Disp: , Rfl:    bevacizumab  (AVASTIN ) 400 MG/16ML SOLN, 1.25 mg every 8 (eight) weeks. Left eye (Patient not taking: Reported on 06/10/2023), Disp: , Rfl:    Cyanocobalamin  (VITAMIN B-12) 2500 MCG SUBL, Place 2,500 mcg under the tongue daily., Disp: , Rfl:    FLUoxetine (PROZAC) 20 MG capsule, Take 20 mg by mouth daily., Disp: , Rfl:    latanoprost  (XALATAN ) 0.005 % ophthalmic solution, Place 1 drop into both eyes at bedtime., Disp: , Rfl:    menthol-zinc oxide (GOLD  BOND) powder, Apply 1 application topically daily as needed (for itching)., Disp: , Rfl:    Multiple Vitamins-Minerals (PRESERVISION AREDS 2) CAPS, Take 1 capsule by mouth 2 (two) times  daily., Disp: ,  Rfl:    naproxen sodium (ANAPROX) 220 MG tablet, Take 440 mg by mouth 2 (two) times daily as needed (for pain)., Disp: , Rfl:    tamsulosin  (FLOMAX ) 0.4 MG CAPS capsule, TAKE 1 CAPSULE BY MOUTH DAILY, Disp: 30 capsule, Rfl: 0   telmisartan  (MICARDIS ) 40 MG tablet, Take 40 mg by mouth daily., Disp: , Rfl:    Allergies: Allergies  Allergen Reactions   Bupropion Hives    REVIEW OF SYSTEMS:   Review of Systems  Constitutional:  Positive for fatigue. Negative for chills and fever.  HENT:   Negative for lump/mass, mouth sores, nosebleeds, sore throat and trouble swallowing.   Eyes:  Negative for eye problems.  Respiratory:  Positive for shortness of breath (with exertion). Negative for cough.   Cardiovascular:  Negative for chest pain, leg swelling and palpitations.  Gastrointestinal:  Negative for abdominal pain, constipation, diarrhea, nausea and vomiting.  Genitourinary:  Negative for bladder incontinence, difficulty urinating, dysuria, frequency, hematuria and nocturia.   Musculoskeletal:  Negative for arthralgias, back pain, flank pain, myalgias and neck pain.  Skin:  Positive for itching (on back from XRT). Negative for rash.  Neurological:  Positive for numbness (in feet). Negative for dizziness and headaches.  Hematological:  Does not bruise/bleed easily.  Psychiatric/Behavioral:  Positive for sleep disturbance. Negative for depression and suicidal ideas. The patient is not nervous/anxious.   All other systems reviewed and are negative.    VITALS:   Blood pressure 129/69, pulse 96, temperature 98.5 F (36.9 C), temperature source Tympanic, resp. rate 20, weight 160 lb 0.9 oz (72.6 kg), SpO2 96%.  Wt Readings from Last 3 Encounters:  09/24/23 160 lb 0.9 oz (72.6 kg)  06/10/23 165 lb 9.6 oz (75.1 kg)  05/21/23 173 lb 11.6 oz (78.8 kg)    Body mass index is 31.26 kg/m.  Performance status (ECOG): 1 - Symptomatic but completely ambulatory  PHYSICAL EXAM:    Physical Exam Vitals and nursing note reviewed. Exam conducted with a chaperone present.  Constitutional:      Appearance: Normal appearance.   Cardiovascular:     Rate and Rhythm: Normal rate and regular rhythm.     Pulses: Normal pulses.     Heart sounds: Normal heart sounds.  Pulmonary:     Effort: Pulmonary effort is normal.     Breath sounds: Normal breath sounds.  Abdominal:     Palpations: Abdomen is soft. There is no hepatomegaly, splenomegaly or mass.     Tenderness: There is no abdominal tenderness.   Musculoskeletal:     Right lower leg: No edema.     Left lower leg: No edema.  Lymphadenopathy:     Cervical: No cervical adenopathy.     Right cervical: No superficial, deep or posterior cervical adenopathy.    Left cervical: No superficial, deep or posterior cervical adenopathy.     Upper Body:     Right upper body: No supraclavicular or axillary adenopathy.     Left upper body: No supraclavicular or axillary adenopathy.   Neurological:     General: No focal deficit present.     Mental Status: He is alert and oriented to person, place, and time.   Psychiatric:        Mood and Affect: Mood normal.        Behavior: Behavior normal.     LABS:      Latest Ref Rng & Units 09/17/2023   12:47 PM 04/16/2023   10:55 AM 12/11/2022  6:28 AM  CBC  WBC 4.0 - 10.5 K/uL 5.5  6.2  6.4   Hemoglobin 13.0 - 17.0 g/dL 85.3  85.0  85.2   Hematocrit 39.0 - 52.0 % 43.9  43.8  43.9   Platelets 150 - 400 K/uL 219  195  169       Latest Ref Rng & Units 09/17/2023   12:47 PM 04/16/2023   10:55 AM 12/11/2022    6:28 AM  CMP  Glucose 70 - 99 mg/dL 890  897  96   BUN 8 - 23 mg/dL 20  18  20    Creatinine 0.61 - 1.24 mg/dL 8.98  8.94  8.94   Sodium 135 - 145 mmol/L 137  136  139   Potassium 3.5 - 5.1 mmol/L 4.3  4.4  4.0   Chloride 98 - 111 mmol/L 104  102  108   CO2 22 - 32 mmol/L 22  25  19    Calcium  8.9 - 10.3 mg/dL 9.3  9.3  9.2   Total Protein 6.5 - 8.1 g/dL 7.1  7.1     Total Bilirubin 0.0 - 1.2 mg/dL 0.4  0.7    Alkaline Phos 38 - 126 U/L 82  72    AST 15 - 41 U/L 14  14    ALT 0 - 44 U/L 17  16       No results found for: CEA1, CEA / No results found for: CEA1, CEA No results found for: PSA1 No results found for: CAN199 No results found for: CAN125  No results found for: TOTALPROTELP, ALBUMINELP, A1GS, A2GS, BETS, BETA2SER, GAMS, MSPIKE, SPEI No results found for: TIBC, FERRITIN, IRONPCTSAT No results found for: LDH   STUDIES:   CT CHEST W CONTRAST Result Date: 09/19/2023 CLINICAL DATA:  Squamous cell carcinoma of the right lung. * Tracking Code: BO * EXAM: CT CHEST WITH CONTRAST TECHNIQUE: Multidetector CT imaging of the chest was performed during intravenous contrast administration. RADIATION DOSE REDUCTION: This exam was performed according to the departmental dose-optimization program which includes automated exposure control, adjustment of the mA and/or kV according to patient size and/or use of iterative reconstruction technique. CONTRAST:  75mL OMNIPAQUE  IOHEXOL  300 MG/ML  SOLN COMPARISON:  Chest CT with contrast 04/23/2023.  PET-CT 05/16/2023 FINDINGS: Cardiovascular: Heart is nonenlarged. Small pericardial effusion, similar to previous. The thoracic aorta is normal course and caliber with partially calcified plaque. Mediastinum/Nodes: Stable heterogeneous thyroid . Patulous esophagus with some mild wall thickening. Please correlate for any symptoms. This was seen previously as well. Mild edema along the mediastinal fat. On the prior PET-CT was a subcarinal node with mild uptake. On the older CT scan this node had a short axis of 5 mm. Today this lymph node is larger with short axis dimension of 14 mm on series 2, image 77. Is also a prominent right hilar node measuring 9 mm on image 68 of series 2. This node previously measured 9 mm. Other mediastinal nodes are less than a cm short axis and similar.  Lungs/Pleura: Emphysematous lung changes are identified. There are areas of interstitial septal thickening diffusely. There is also some areas of ground-glass scattered in both lungs. Pleural thickening and trace fluid at the right posterior lower lobe, increased from previous. Left upper lobe nodule with fiduciary marker and adjacent bandlike changes is again seen. On the standard CT scan of January 2025 this measured 9 x 7 mm. Today 9 x 8 mm on series 4 image 28. No new left-sided  dominant lung nodule. The suprahilar right lung opacity and bandlike change extending out to pleura has a similar configuration compared to the previous examination. This extends out to the apex as well laterally. Again stable. Associated distortion and bronchiectasis. The bandlike area as well along the anterior superior middle lobe extending out to the pleura with thickening is also stable on series 4, image 74. This had some low-level uptake. There are however some lung nodules which were seen previously. These include right lower lobe nodule on series 4, image 97 which is stable measuring 4 mm. Small calcification right lower lobe image 87 is stable. The fiduciary marker along the inferior aspect of right upper lobe with a peripheral area of ill-defined nodularity is measured at 6 mm on series 4, image 61. Previously when measured in the same fashion this would have measured 7 mm. The larger focus in the inferior right upper lobe which measured 14 x 7 mm on the prior examination, today is essentially resolved with some ill-defined soft tissue thickening in this location on image 70 of series 4. A small nodule like area in this location measures 4 mm. Upper Abdomen: Fatty liver infiltration. Multiple hepatic cystic foci. Renal left-sided foci identified at the edge of the imaging field. The adrenal glands are preserved. Musculoskeletal: Diffuse degenerative changes along the spine. IMPRESSION: Further enlargement of the subcarinal  lymph node previously showing uptake. Area of potential worsening disease. Stable prominent right hilar node. This was not hypermetabolic. No new areas of nodal enlargement at this time. The hypermetabolic nodule along the inferior right upper lobe has markedly improved. There are several other areas of lung nodularity which are similar to previous. Bandlike nodular areas of opacity in the right upper lobe and middle lobe are stable. Persistent wall thickening along the esophagus. Please correlate for any symptoms. Slight increase in the tiny right pleural effusion. Aortic Atherosclerosis (ICD10-I70.0) and Emphysema (ICD10-J43.9). Electronically Signed   By: Ranell Bring M.D.   On: 09/19/2023 17:18

## 2023-10-04 NOTE — Progress Notes (Signed)
 Radiation Oncology         [phone] ________________________________  Name: Eric Hinton MRN: [id number]  Date: 10/07/2023  DOB: 1941/03/06  Follow-Up Visit and reevaluation note  CC: [name], MD  [name], MD    ICD-10-CM   1. Squamous cell carcinoma of upper lobe of right lung (HCC)  C34.11       Diagnosis: Stage IIIa (T1 aN2 M0) squamous cell carcinoma of right lung; s/p concurrent chemoradiation in 2013. Patient had a recurrence in the bilateral lungs and was treated with SBRT which he completed on 01/28/2023. He had an additional recurrence in the RUL which was treated with SBRT completed on 07/02/2023. He presents today with an enlarging subcarinal lymph node.    History of prostate cancer also diagnosed in 2013, s/p Calypso treatment  Interval Since Last Radiation: 3 months and 6 days   Intent: Curative  Radiation Treatment Dates: First Treatment Date: 2023-06-25 -- Last Treatment Date: 2023-07-02 Site/Dose/Technique/Mode:  Plan Name: Lung_R_SBRT Site: Lung, Right Technique: SBRT/SRT-IMRT Mode: Photon Dose Per Fraction: 18 Gy Prescribed Dose (Delivered / Prescribed): 54 Gy / 54 Gy Prescribed Fxs (Delivered / Prescribed): 3 / 3  Narrative:  The patient returns today for routine follow-up. He tolerated radiation therapy relatively well overall other than mild fatigue and some pruritus to the chest during treatment.   Since completing radiation therapy, he followed up with Dr. Rogers on 09/24/23. During which time, his most recent chest CT performed on 09/17/23 was reviewed which showed a further increase in size of the subcarinal lymph node measuring 14 mm, previously measuring 5 mm in January. Imaging otherwise showed stability of the prominent right hilar node and marked improvement of the RUL nodule s/p radiation therapy.   Given his recent CT findings, Dr. Rogers has recommended considering SBRT to the subcarinal lymph node which we will discuss  in detail today.     No other significant oncologic interval history since the patient completed radiation therapy, or in the interval since he was seen for reconsultation in March.   Patient notes he denies any recent changes to his breathing.  He denies any pain within the chest area significant cough or hemoptysis.                           Allergies:  is allergic to bupropion.  Meds: Current Outpatient Medications  Medication Sig Dispense Refill   albuterol (VENTOLIN HFA) 108 (90 Base) MCG/ACT inhaler Inhale 1 puff into the lungs every 6 (six) hours as needed.     aspirin  325 MG tablet Take 1 tablet (325 mg total) by mouth daily.     atorvastatin  (LIPITOR) 10 MG tablet Take 10 mg by mouth daily.     bevacizumab  (AVASTIN ) 400 MG/16ML SOLN 1.25 mg every 8 (eight) weeks. Left eye (Patient not taking: Reported on 06/10/2023)     Cyanocobalamin  (VITAMIN B-12) 2500 MCG SUBL Place 2,500 mcg under the tongue daily.     FLUoxetine (PROZAC) 20 MG capsule Take 20 mg by mouth daily.     latanoprost  (XALATAN ) 0.005 % ophthalmic solution Place 1 drop into both eyes at bedtime.     menthol-zinc oxide (GOLD  BOND) powder Apply 1 application topically daily as needed (for itching).     Multiple Vitamins-Minerals (PRESERVISION AREDS 2) CAPS Take 1 capsule by mouth 2 (two) times daily.     naproxen sodium (ANAPROX) 220 MG tablet Take 440 mg by  mouth 2 (two) times daily as needed (for pain).     tamsulosin  (FLOMAX ) 0.4 MG CAPS capsule TAKE 1 CAPSULE BY MOUTH DAILY 30 capsule 0   telmisartan  (MICARDIS ) 40 MG tablet Take 40 mg by mouth daily.     No current facility-administered medications for this encounter.    Physical Findings: The patient is in no acute distress. Patient is alert and oriented.  height is 5' (1.524 m) and weight is 161 lb 4 oz (73.1 kg). His temporal temperature is 97.7 F (36.5 C). His blood pressure is 132/60 and his pulse is 93. His respiration is 18 and oxygen saturation is 96%. .   No significant changes. Lungs are clear to auscultation bilaterally. Heart has regular rate and rhythm. No palpable cervical, supraclavicular, or axillary adenopathy. Abdomen soft, non-tender, normal bowel sounds.   Lab Findings: Lab Results  Component Value Date   WBC 5.5 09/17/2023   HGB 14.6 09/17/2023   HCT 43.9 09/17/2023   MCV 94.2 09/17/2023   PLT 219 09/17/2023    Radiographic Findings: CT CHEST W CONTRAST Result Date: 09/19/2023 CLINICAL DATA:  Squamous cell carcinoma of the right lung. * Tracking Code: BO * EXAM: CT CHEST WITH CONTRAST TECHNIQUE: Multidetector CT imaging of the chest was performed during intravenous contrast administration. RADIATION DOSE REDUCTION: This exam was performed according to the departmental dose-optimization program which includes automated exposure control, adjustment of the mA and/or kV according to patient size and/or use of iterative reconstruction technique. CONTRAST:  75mL OMNIPAQUE  IOHEXOL  300 MG/ML  SOLN COMPARISON:  Chest CT with contrast 04/23/2023.  PET-CT 05/16/2023 FINDINGS: Cardiovascular: Heart is nonenlarged. Small pericardial effusion, similar to previous. The thoracic aorta is normal course and caliber with partially calcified plaque. Mediastinum/Nodes: Stable heterogeneous thyroid . Patulous esophagus with some mild wall thickening. Please correlate for any symptoms. This was seen previously as well. Mild edema along the mediastinal fat. On the prior PET-CT was a subcarinal node with mild uptake. On the older CT scan this node had a short axis of 5 mm. Today this lymph node is larger with short axis dimension of 14 mm on series 2, image 77. Is also a prominent right hilar node measuring 9 mm on image 68 of series 2. This node previously measured 9 mm. Other mediastinal nodes are less than a cm short axis and similar. Lungs/Pleura: Emphysematous lung changes are identified. There are areas of interstitial septal thickening diffusely. There is  also some areas of ground-glass scattered in both lungs. Pleural thickening and trace fluid at the right posterior lower lobe, increased from previous. Left upper lobe nodule with fiduciary marker and adjacent bandlike changes is again seen. On the standard CT scan of January 2025 this measured 9 x 7 mm. Today 9 x 8 mm on series 4 image 28. No new left-sided dominant lung nodule. The suprahilar right lung opacity and bandlike change extending out to pleura has a similar configuration compared to the previous examination. This extends out to the apex as well laterally. Again stable. Associated distortion and bronchiectasis. The bandlike area as well along the anterior superior middle lobe extending out to the pleura with thickening is also stable on series 4, image 74. This had some low-level uptake. There are however some lung nodules which were seen previously. These include right lower lobe nodule on series 4, image 97 which is stable measuring 4 mm. Small calcification right lower lobe image 87 is stable. The fiduciary marker along the inferior aspect of right upper  lobe with a peripheral area of ill-defined nodularity is measured at 6 mm on series 4, image 61. Previously when measured in the same fashion this would have measured 7 mm. The larger focus in the inferior right upper lobe which measured 14 x 7 mm on the prior examination, today is essentially resolved with some ill-defined soft tissue thickening in this location on image 70 of series 4. A small nodule like area in this location measures 4 mm. Upper Abdomen: Fatty liver infiltration. Multiple hepatic cystic foci. Renal left-sided foci identified at the edge of the imaging field. The adrenal glands are preserved. Musculoskeletal: Diffuse degenerative changes along the spine. IMPRESSION: Further enlargement of the subcarinal lymph node previously showing uptake. Area of potential worsening disease. Stable prominent right hilar node. This was not  hypermetabolic. No new areas of nodal enlargement at this time. The hypermetabolic nodule along the inferior right upper lobe has markedly improved. There are several other areas of lung nodularity which are similar to previous. Bandlike nodular areas of opacity in the right upper lobe and middle lobe are stable. Persistent wall thickening along the esophagus. Please correlate for any symptoms. Slight increase in the tiny right pleural effusion. Aortic Atherosclerosis (ICD10-I70.0) and Emphysema (ICD10-J43.9). Electronically Signed   By: Ranell Bring M.D.   On: 09/19/2023 17:18    Impression: Stage IIIa (T1 aN2 M0) squamous cell carcinoma of right lung; s/p concurrent chemoradiation in 2013. Patient had a recurrence in the bilateral lung and was treated with SBRT completed on 01/28/2023. He had an additional recurrence in the RUL which was treated with SBRT completed on 07/02/2023; Now with an enlarging subcarinal lymph node.   I personally reviewed the patient's most recent imaging. CT of the chest on 09/17/2023 demonstrates enlargement of the a subcarinal node, now measuring 14 mm (previously 5 mm in January of this year). Findings are concerning for disease progression. He is a good candidate for radiation therapy to the enlarging subcarinal lymph node. Given the proximity to the heart and esophagus, recommend a short course of ultra-hypofractionated radiation (UHRT).  Of note, scan showed improvement in the most recently treated RUL nodule and no other evidence of disease progression.   Today, I talked to the patient about the findings and work-up thus far.  We discussed the natural history of progressive NSCLC and general treatment, highlighting the role of radiotherapy in the management.  We discussed the available radiation techniques, and focused on the details of logistics and delivery.  We reviewed the anticipated acute and late sequelae associated with radiation in this setting.  The patient was  encouraged to ask questions that I answered to the best of my ability.  A patient consent form was discussed and signed.  We retained a copy for our records.  The patient would like to proceed with radiation.   Plan: Patient is scheduled for CT simulation on 10/10/2023. Anticipate 10 fractions of UHRT to the enlarging subcarinal lymph node.   Patient will follow-up with Dr. Ivana in approximately 3-4 months after re-staging imaging.    30 minutes of total time was spent for this patient encounter, including preparation, face-to-face counseling with the patient and coordination of care, physical exam, and documentation of the encounter. ____________________________________  Lynwood CHARM Nasuti, PhD, MD  This document serves as a record of services personally performed by Lynwood Nasuti, MD. It was created on his behalf by Dorthy Fuse, a trained medical scribe. The creation of this record is based on the scribe's  personal observations and the provider's statements to them. This document has been checked and approved by the attending provider.

## 2023-10-07 ENCOUNTER — Ambulatory Visit
Admission: RE | Admit: 2023-10-07 | Discharge: 2023-10-07 | Disposition: A | Discharge status: Home / Self Care | Source: Ambulatory Visit | Attending: Radiation Oncology | Admitting: Radiation Oncology

## 2023-10-07 ENCOUNTER — Encounter: Payer: Self-pay | Admitting: Radiation Oncology

## 2023-10-07 VITALS — BP 132/60 | HR 93 | Temp 97.7°F | Resp 18 | Ht 60.0 in | Wt 161.2 lb

## 2023-10-07 DIAGNOSIS — C3411 Malignant neoplasm of upper lobe, right bronchus or lung: Secondary | ICD-10-CM | POA: Diagnosis not present

## 2023-10-07 DIAGNOSIS — C3432 Malignant neoplasm of lower lobe, left bronchus or lung: Secondary | ICD-10-CM | POA: Diagnosis not present

## 2023-10-07 DIAGNOSIS — J439 Emphysema, unspecified: Secondary | ICD-10-CM | POA: Diagnosis not present

## 2023-10-07 DIAGNOSIS — K760 Fatty (change of) liver, not elsewhere classified: Secondary | ICD-10-CM | POA: Insufficient documentation

## 2023-10-07 DIAGNOSIS — Z79899 Other long term (current) drug therapy: Secondary | ICD-10-CM | POA: Diagnosis not present

## 2023-10-07 DIAGNOSIS — J90 Pleural effusion, not elsewhere classified: Secondary | ICD-10-CM | POA: Diagnosis not present

## 2023-10-07 DIAGNOSIS — Z923 Personal history of irradiation: Secondary | ICD-10-CM | POA: Diagnosis not present

## 2023-10-07 DIAGNOSIS — C3490 Malignant neoplasm of unspecified part of unspecified bronchus or lung: Secondary | ICD-10-CM

## 2023-10-07 DIAGNOSIS — R590 Localized enlarged lymph nodes: Secondary | ICD-10-CM | POA: Insufficient documentation

## 2023-10-07 DIAGNOSIS — C61 Malignant neoplasm of prostate: Secondary | ICD-10-CM | POA: Insufficient documentation

## 2023-10-07 DIAGNOSIS — I700 Atherosclerosis of aorta: Secondary | ICD-10-CM | POA: Insufficient documentation

## 2023-10-07 DIAGNOSIS — R609 Edema, unspecified: Secondary | ICD-10-CM | POA: Insufficient documentation

## 2023-10-07 DIAGNOSIS — Z7982 Long term (current) use of aspirin: Secondary | ICD-10-CM | POA: Insufficient documentation

## 2023-10-07 DIAGNOSIS — I3139 Other pericardial effusion (noninflammatory): Secondary | ICD-10-CM | POA: Diagnosis not present

## 2023-10-07 DIAGNOSIS — F17210 Nicotine dependence, cigarettes, uncomplicated: Secondary | ICD-10-CM | POA: Diagnosis not present

## 2023-10-07 DIAGNOSIS — C3412 Malignant neoplasm of upper lobe, left bronchus or lung: Secondary | ICD-10-CM | POA: Diagnosis not present

## 2023-10-07 NOTE — Progress Notes (Signed)
 Eric Hinton is here today for follow up post radiation to the lung.  Lung Side: Right, patient completed treatment on 07/02/23  Does the patient complain of any of the following: Pain: No Shortness of breath w/wo exertion:  Yes mostly on exertion.  Cough: Yes, productive Hemoptysis: No Pain with swallowing: No Swallowing/choking concerns: No Appetite: Good  Energy Level: Fair Post radiation skin Changes: No    Additional comments if applicable:   BP 132/60 (BP Location: Left Arm, Patient Position: Sitting)   Pulse 93   Temp 97.7 F (36.5 C) (Temporal)   Resp 18   Ht 5' (1.524 m)   Wt 161 lb 4 oz (73.1 kg)   SpO2 96%   BMI 31.49 kg/m

## 2023-10-10 ENCOUNTER — Ambulatory Visit
Admission: RE | Admit: 2023-10-10 | Discharge: 2023-10-10 | Disposition: A | Discharge status: Home / Self Care | Source: Ambulatory Visit | Attending: Radiation Oncology | Admitting: Radiation Oncology

## 2023-10-10 ENCOUNTER — Ambulatory Visit: Admitting: Radiation Oncology

## 2023-10-10 DIAGNOSIS — C3411 Malignant neoplasm of upper lobe, right bronchus or lung: Secondary | ICD-10-CM | POA: Insufficient documentation

## 2023-10-10 DIAGNOSIS — Z510 Encounter for antineoplastic radiation therapy: Secondary | ICD-10-CM | POA: Insufficient documentation

## 2023-10-10 DIAGNOSIS — C3490 Malignant neoplasm of unspecified part of unspecified bronchus or lung: Secondary | ICD-10-CM | POA: Insufficient documentation

## 2023-10-10 DIAGNOSIS — F17210 Nicotine dependence, cigarettes, uncomplicated: Secondary | ICD-10-CM | POA: Diagnosis not present

## 2023-10-17 ENCOUNTER — Telehealth: Payer: Self-pay | Admitting: Radiology

## 2023-10-17 NOTE — Telephone Encounter (Signed)
 I returned the patient's call to re-discuss benefits versus risks of radiation treatment. After reviewing these he would like still like to proceed with treatment to the subcarinal lymph node. He is scheduled to begin radiation on 10/21/2023. We look forward to participating in this patient's care. He was encouraged to call with any further questions or concerns in the meantime.     Leeroy Due, PA-C

## 2023-10-20 DIAGNOSIS — F17210 Nicotine dependence, cigarettes, uncomplicated: Secondary | ICD-10-CM | POA: Diagnosis not present

## 2023-10-20 DIAGNOSIS — C3411 Malignant neoplasm of upper lobe, right bronchus or lung: Secondary | ICD-10-CM | POA: Diagnosis not present

## 2023-10-20 DIAGNOSIS — Z510 Encounter for antineoplastic radiation therapy: Secondary | ICD-10-CM | POA: Diagnosis not present

## 2023-10-21 ENCOUNTER — Ambulatory Visit
Admission: RE | Admit: 2023-10-21 | Discharge: 2023-10-21 | Disposition: A | Discharge status: Home / Self Care | Source: Ambulatory Visit | Attending: Radiation Oncology | Admitting: Radiation Oncology

## 2023-10-21 ENCOUNTER — Other Ambulatory Visit: Payer: Self-pay

## 2023-10-21 DIAGNOSIS — F17210 Nicotine dependence, cigarettes, uncomplicated: Secondary | ICD-10-CM | POA: Diagnosis not present

## 2023-10-21 DIAGNOSIS — C3411 Malignant neoplasm of upper lobe, right bronchus or lung: Secondary | ICD-10-CM | POA: Diagnosis not present

## 2023-10-21 DIAGNOSIS — C3490 Malignant neoplasm of unspecified part of unspecified bronchus or lung: Secondary | ICD-10-CM

## 2023-10-21 DIAGNOSIS — Z510 Encounter for antineoplastic radiation therapy: Secondary | ICD-10-CM | POA: Diagnosis not present

## 2023-10-21 LAB — RAD ONC ARIA SESSION SUMMARY
Course Elapsed Days: 0
Plan Fractions Treated to Date: 1
Plan Prescribed Dose Per Fraction: 4 Gy
Plan Total Fractions Prescribed: 10
Plan Total Prescribed Dose: 40 Gy
Reference Point Dosage Given to Date: 4 Gy
Reference Point Session Dosage Given: 4 Gy
Session Number: 1

## 2023-10-22 ENCOUNTER — Ambulatory Visit
Admission: RE | Admit: 2023-10-22 | Discharge: 2023-10-22 | Disposition: A | Discharge status: Home / Self Care | Source: Ambulatory Visit | Attending: Radiation Oncology | Admitting: Radiation Oncology

## 2023-10-22 ENCOUNTER — Other Ambulatory Visit: Payer: Self-pay

## 2023-10-22 DIAGNOSIS — C3411 Malignant neoplasm of upper lobe, right bronchus or lung: Secondary | ICD-10-CM | POA: Diagnosis not present

## 2023-10-22 DIAGNOSIS — Z510 Encounter for antineoplastic radiation therapy: Secondary | ICD-10-CM | POA: Diagnosis not present

## 2023-10-22 DIAGNOSIS — F17210 Nicotine dependence, cigarettes, uncomplicated: Secondary | ICD-10-CM | POA: Diagnosis not present

## 2023-10-22 LAB — RAD ONC ARIA SESSION SUMMARY
Course Elapsed Days: 1
Plan Fractions Treated to Date: 2
Plan Prescribed Dose Per Fraction: 4 Gy
Plan Total Fractions Prescribed: 10
Plan Total Prescribed Dose: 40 Gy
Reference Point Dosage Given to Date: 8 Gy
Reference Point Session Dosage Given: 4 Gy
Session Number: 2

## 2023-10-23 ENCOUNTER — Ambulatory Visit
Admission: RE | Admit: 2023-10-23 | Discharge: 2023-10-23 | Disposition: A | Discharge status: Home / Self Care | Source: Ambulatory Visit | Attending: Radiation Oncology | Admitting: Radiation Oncology

## 2023-10-23 ENCOUNTER — Other Ambulatory Visit: Payer: Self-pay

## 2023-10-23 DIAGNOSIS — C3411 Malignant neoplasm of upper lobe, right bronchus or lung: Secondary | ICD-10-CM | POA: Diagnosis not present

## 2023-10-23 DIAGNOSIS — Z510 Encounter for antineoplastic radiation therapy: Secondary | ICD-10-CM | POA: Diagnosis not present

## 2023-10-23 DIAGNOSIS — F17210 Nicotine dependence, cigarettes, uncomplicated: Secondary | ICD-10-CM | POA: Diagnosis not present

## 2023-10-23 LAB — RAD ONC ARIA SESSION SUMMARY
Course Elapsed Days: 2
Plan Fractions Treated to Date: 3
Plan Prescribed Dose Per Fraction: 4 Gy
Plan Total Fractions Prescribed: 10
Plan Total Prescribed Dose: 40 Gy
Reference Point Dosage Given to Date: 12 Gy
Reference Point Session Dosage Given: 4 Gy
Session Number: 3

## 2023-10-24 ENCOUNTER — Other Ambulatory Visit: Payer: Self-pay

## 2023-10-24 ENCOUNTER — Ambulatory Visit
Admission: RE | Admit: 2023-10-24 | Discharge: 2023-10-24 | Disposition: A | Discharge status: Home / Self Care | Source: Ambulatory Visit | Attending: Radiation Oncology | Admitting: Radiation Oncology

## 2023-10-24 DIAGNOSIS — Z510 Encounter for antineoplastic radiation therapy: Secondary | ICD-10-CM | POA: Diagnosis not present

## 2023-10-24 DIAGNOSIS — F17210 Nicotine dependence, cigarettes, uncomplicated: Secondary | ICD-10-CM | POA: Diagnosis not present

## 2023-10-24 DIAGNOSIS — C3411 Malignant neoplasm of upper lobe, right bronchus or lung: Secondary | ICD-10-CM | POA: Diagnosis not present

## 2023-10-24 LAB — RAD ONC ARIA SESSION SUMMARY
Course Elapsed Days: 3
Plan Fractions Treated to Date: 4
Plan Prescribed Dose Per Fraction: 4 Gy
Plan Total Fractions Prescribed: 10
Plan Total Prescribed Dose: 40 Gy
Reference Point Dosage Given to Date: 16 Gy
Reference Point Session Dosage Given: 4 Gy
Session Number: 4

## 2023-10-25 ENCOUNTER — Other Ambulatory Visit: Payer: Self-pay

## 2023-10-25 ENCOUNTER — Ambulatory Visit
Admission: RE | Admit: 2023-10-25 | Discharge: 2023-10-25 | Disposition: A | Discharge status: Home / Self Care | Source: Ambulatory Visit | Attending: Radiation Oncology | Admitting: Radiation Oncology

## 2023-10-25 DIAGNOSIS — F17210 Nicotine dependence, cigarettes, uncomplicated: Secondary | ICD-10-CM | POA: Diagnosis not present

## 2023-10-25 DIAGNOSIS — C3411 Malignant neoplasm of upper lobe, right bronchus or lung: Secondary | ICD-10-CM | POA: Insufficient documentation

## 2023-10-25 DIAGNOSIS — Z510 Encounter for antineoplastic radiation therapy: Secondary | ICD-10-CM | POA: Diagnosis not present

## 2023-10-25 LAB — RAD ONC ARIA SESSION SUMMARY
Course Elapsed Days: 4
Plan Fractions Treated to Date: 5
Plan Prescribed Dose Per Fraction: 4 Gy
Plan Total Fractions Prescribed: 10
Plan Total Prescribed Dose: 40 Gy
Reference Point Dosage Given to Date: 20 Gy
Reference Point Session Dosage Given: 4 Gy
Session Number: 5

## 2023-10-28 ENCOUNTER — Ambulatory Visit

## 2023-10-29 ENCOUNTER — Inpatient Hospital Stay

## 2023-10-29 ENCOUNTER — Other Ambulatory Visit: Payer: Self-pay

## 2023-10-29 ENCOUNTER — Other Ambulatory Visit (HOSPITAL_COMMUNITY)

## 2023-10-29 ENCOUNTER — Ambulatory Visit
Admission: RE | Admit: 2023-10-29 | Discharge: 2023-10-29 | Disposition: A | Discharge status: Home / Self Care | Source: Ambulatory Visit | Attending: Radiation Oncology | Admitting: Radiation Oncology

## 2023-10-29 DIAGNOSIS — C3411 Malignant neoplasm of upper lobe, right bronchus or lung: Secondary | ICD-10-CM | POA: Diagnosis not present

## 2023-10-29 DIAGNOSIS — F17210 Nicotine dependence, cigarettes, uncomplicated: Secondary | ICD-10-CM | POA: Diagnosis not present

## 2023-10-29 DIAGNOSIS — Z510 Encounter for antineoplastic radiation therapy: Secondary | ICD-10-CM | POA: Diagnosis not present

## 2023-10-29 LAB — RAD ONC ARIA SESSION SUMMARY
Course Elapsed Days: 8
Plan Fractions Treated to Date: 6
Plan Prescribed Dose Per Fraction: 4 Gy
Plan Total Fractions Prescribed: 10
Plan Total Prescribed Dose: 40 Gy
Reference Point Dosage Given to Date: 24 Gy
Reference Point Session Dosage Given: 4 Gy
Session Number: 6

## 2023-10-30 ENCOUNTER — Ambulatory Visit
Admission: RE | Admit: 2023-10-30 | Discharge: 2023-10-30 | Disposition: A | Discharge status: Home / Self Care | Source: Ambulatory Visit | Attending: Radiation Oncology | Admitting: Radiation Oncology

## 2023-10-30 ENCOUNTER — Other Ambulatory Visit: Payer: Self-pay

## 2023-10-30 DIAGNOSIS — Z510 Encounter for antineoplastic radiation therapy: Secondary | ICD-10-CM | POA: Diagnosis not present

## 2023-10-30 DIAGNOSIS — C3411 Malignant neoplasm of upper lobe, right bronchus or lung: Secondary | ICD-10-CM | POA: Diagnosis not present

## 2023-10-30 DIAGNOSIS — F17210 Nicotine dependence, cigarettes, uncomplicated: Secondary | ICD-10-CM | POA: Diagnosis not present

## 2023-10-30 LAB — RAD ONC ARIA SESSION SUMMARY
Course Elapsed Days: 9
Plan Fractions Treated to Date: 7
Plan Prescribed Dose Per Fraction: 4 Gy
Plan Total Fractions Prescribed: 10
Plan Total Prescribed Dose: 40 Gy
Reference Point Dosage Given to Date: 28 Gy
Reference Point Session Dosage Given: 4 Gy
Session Number: 7

## 2023-10-31 ENCOUNTER — Other Ambulatory Visit: Payer: Self-pay

## 2023-10-31 ENCOUNTER — Ambulatory Visit
Admission: RE | Admit: 2023-10-31 | Discharge: 2023-10-31 | Disposition: A | Discharge status: Home / Self Care | Source: Ambulatory Visit | Attending: Radiation Oncology | Admitting: Radiation Oncology

## 2023-10-31 DIAGNOSIS — F17210 Nicotine dependence, cigarettes, uncomplicated: Secondary | ICD-10-CM | POA: Diagnosis not present

## 2023-10-31 DIAGNOSIS — Z510 Encounter for antineoplastic radiation therapy: Secondary | ICD-10-CM | POA: Diagnosis not present

## 2023-10-31 DIAGNOSIS — C3411 Malignant neoplasm of upper lobe, right bronchus or lung: Secondary | ICD-10-CM | POA: Diagnosis not present

## 2023-10-31 LAB — RAD ONC ARIA SESSION SUMMARY
Course Elapsed Days: 10
Plan Fractions Treated to Date: 8
Plan Prescribed Dose Per Fraction: 4 Gy
Plan Total Fractions Prescribed: 10
Plan Total Prescribed Dose: 40 Gy
Reference Point Dosage Given to Date: 32 Gy
Reference Point Session Dosage Given: 4 Gy
Session Number: 8

## 2023-11-01 ENCOUNTER — Other Ambulatory Visit: Payer: Self-pay

## 2023-11-01 ENCOUNTER — Ambulatory Visit
Admission: RE | Admit: 2023-11-01 | Discharge: 2023-11-01 | Disposition: A | Discharge status: Home / Self Care | Source: Ambulatory Visit | Attending: Radiation Oncology | Admitting: Radiation Oncology

## 2023-11-01 DIAGNOSIS — F17210 Nicotine dependence, cigarettes, uncomplicated: Secondary | ICD-10-CM | POA: Diagnosis not present

## 2023-11-01 DIAGNOSIS — C3411 Malignant neoplasm of upper lobe, right bronchus or lung: Secondary | ICD-10-CM | POA: Diagnosis not present

## 2023-11-01 DIAGNOSIS — Z510 Encounter for antineoplastic radiation therapy: Secondary | ICD-10-CM | POA: Diagnosis not present

## 2023-11-01 LAB — RAD ONC ARIA SESSION SUMMARY
Course Elapsed Days: 11
Plan Fractions Treated to Date: 9
Plan Prescribed Dose Per Fraction: 4 Gy
Plan Total Fractions Prescribed: 10
Plan Total Prescribed Dose: 40 Gy
Reference Point Dosage Given to Date: 36 Gy
Reference Point Session Dosage Given: 4 Gy
Session Number: 9

## 2023-11-04 ENCOUNTER — Other Ambulatory Visit: Payer: Self-pay

## 2023-11-04 ENCOUNTER — Ambulatory Visit
Admission: RE | Admit: 2023-11-04 | Discharge: 2023-11-04 | Disposition: A | Discharge status: Home / Self Care | Source: Ambulatory Visit | Attending: Radiation Oncology | Admitting: Radiation Oncology

## 2023-11-04 DIAGNOSIS — C3411 Malignant neoplasm of upper lobe, right bronchus or lung: Secondary | ICD-10-CM | POA: Diagnosis not present

## 2023-11-04 DIAGNOSIS — Z510 Encounter for antineoplastic radiation therapy: Secondary | ICD-10-CM | POA: Diagnosis not present

## 2023-11-04 DIAGNOSIS — F17210 Nicotine dependence, cigarettes, uncomplicated: Secondary | ICD-10-CM | POA: Diagnosis not present

## 2023-11-04 LAB — RAD ONC ARIA SESSION SUMMARY
Course Elapsed Days: 14
Plan Fractions Treated to Date: 10
Plan Prescribed Dose Per Fraction: 4 Gy
Plan Total Fractions Prescribed: 10
Plan Total Prescribed Dose: 40 Gy
Reference Point Dosage Given to Date: 40 Gy
Reference Point Session Dosage Given: 4 Gy
Session Number: 10

## 2023-11-05 ENCOUNTER — Inpatient Hospital Stay: Admitting: Oncology

## 2023-11-05 NOTE — Radiation Completion Notes (Addendum)
  Radiation Oncology         [phone] ________________________________  Name: Eric Hinton MRN: [id number]  Date of Service: 11/04/2023  DOB: 1940/04/08  End of Treatment Note  Diagnosis: Stage IIIa (T1 aN2 M0) squamous cell carcinoma of right lung with subcarinal lymph node metastasis Intent: Curative     ==========DELIVERED PLANS==========  First Treatment Date: 2023-10-21 Last Treatment Date: 2023-11-04   Plan Name: Lung_R_UHRT Site: Lung, Right Technique: IMRT Mode: Photon Dose Per Fraction: 4 Gy Prescribed Dose (Delivered / Prescribed): 40 Gy / 40 Gy Prescribed Fxs (Delivered / Prescribed): 10 / 10     ====================================   The patient tolerated radiation. He developed fatigue and experienced a productive cough and shortness of breath on exertion.   The patient will return in one month for routine follow-up.      Ronita Due, PA-C

## 2023-12-06 ENCOUNTER — Encounter: Payer: Self-pay | Admitting: Radiation Oncology

## 2023-12-08 NOTE — Progress Notes (Signed)
 Radiation Oncology         [phone] ________________________________  Name: Eric Hinton MRN: [id number]  Date: 12/09/2023  DOB: [birth year]  Follow-Up Visit Note  CC: [name], MD  [name], MD    ICD-10-CM   1. Squamous cell carcinoma of right lung (HCC)  C34.91 Ambulatory referral to Hematology / Oncology       Diagnosis: Stage IIIa (T1 aN2 M0) squamous cell carcinoma of right lung; s/p concurrent chemoradiation in 2013. Patient had a recurrence in the bilateral lungs and was treated with SBRT which he completed on 01/28/2023. He had an additional recurrence in the RUL which was treated with SBRT completed on 07/02/2023, now with an enlarging subcarinal lymph node in July of 2025 which was also treated with radiation therapy completed on 11/04/2023   History of prostate cancer also diagnosed in 2013, s/p Calypso treatment  Interval Since Last Radiation: 1 month and 18 days   2)  Intent: Curative  Radiation Treatment Dates: First Treatment Date: 2023-10-21 -- Last Treatment Date: 2023-11-04 Site/Dose/Technique/Mode:  Plan Name: Lung_R_UHRT Site: Lung, Right (enlarging subcarinal lymph node) Technique: IMRT Mode: Photon Dose Per Fraction: 4 Gy Prescribed Dose (Delivered / Prescribed): 40 Gy / 40 Gy Prescribed Fxs (Delivered / Prescribed): 10 / 10  1)  Intent: Curative  Radiation Treatment Dates: First Treatment Date: 2023-06-25 -- Last Treatment Date: 2023-07-02 Site/Dose/Technique/Mode:  Plan Name: Lung_R_SBRT Site: Lung, Right Technique: SBRT/SRT-IMRT Mode: Photon Dose Per Fraction: 18 Gy Prescribed Dose (Delivered / Prescribed): 54 Gy / 54 Gy Prescribed Fxs (Delivered / Prescribed): 3 / 3  Narrative:  The patient returns today for routine follow-up. He tolerated radiation therapy relatively well other than fatigue, productive cough, and shortness of breath on exertion.      No other significant interval history since he was seen for follow-up prior  to his most recent course of radiation therapy, or since he completed radiation therapy last month.   Patient reports to be doing well overall today. He denies any changes to his breathing since starting his radiation treatment. He denies any difficulty with swallowing. He notes a good appetite and energy level.                             Allergies:  is allergic to bupropion.  Meds: Current Outpatient Medications  Medication Sig Dispense Refill   albuterol (VENTOLIN HFA) 108 (90 Base) MCG/ACT inhaler Inhale 1 puff into the lungs every 6 (six) hours as needed.     aspirin  325 MG tablet Take 1 tablet (325 mg total) by mouth daily.     atorvastatin  (LIPITOR) 10 MG tablet Take 10 mg by mouth daily.     bevacizumab  (AVASTIN ) 400 MG/16ML SOLN 1.25 mg every 8 (eight) weeks. Left eye (Patient not taking: Reported on 06/10/2023)     Cyanocobalamin  (VITAMIN B-12) 2500 MCG SUBL Place 2,500 mcg under the tongue daily.     FLUoxetine (PROZAC) 20 MG capsule Take 20 mg by mouth daily.     latanoprost  (XALATAN ) 0.005 % ophthalmic solution Place 1 drop into both eyes at bedtime.     menthol-zinc oxide (GOLD  BOND) powder Apply 1 application topically daily as needed (for itching).     Multiple Vitamins-Minerals (PRESERVISION AREDS 2) CAPS Take 1 capsule by mouth 2 (two) times daily.     naproxen sodium (ANAPROX) 220 MG tablet Take 440 mg by mouth 2 (two) times daily as  needed (for pain).     tamsulosin  (FLOMAX ) 0.4 MG CAPS capsule TAKE 1 CAPSULE BY MOUTH DAILY 30 capsule 0   telmisartan  (MICARDIS ) 40 MG tablet Take 40 mg by mouth daily.     No current facility-administered medications for this encounter.    Physical Findings: The patient is in no acute distress. Patient is alert and oriented.  height is 5' (1.524 m) and weight is 153 lb 8 oz (69.6 kg). His temporal temperature is 97.3 F (36.3 C) (abnormal). His blood pressure is 139/69 and his pulse is 88. His respiration is 18 and oxygen saturation is  98%. .  No significant changes. Lungs are clear to auscultation bilaterally. Heart has regular rate and rhythm. No palpable cervical, supraclavicular, or axillary adenopathy. Abdomen soft, non-tender, normal bowel sounds.   Lab Findings: Lab Results  Component Value Date   WBC 5.5 09/17/2023   HGB 14.6 09/17/2023   HCT 43.9 09/17/2023   MCV 94.2 09/17/2023   PLT 219 09/17/2023    Radiographic Findings: No results found.  Impression: Stage IIIa (T1 aN2 M0) squamous cell carcinoma of right lung; s/p concurrent chemoradiation in 2013. Patient had a recurrence in the bilateral lungs and was treated with SBRT which he completed on 01/28/2023. He had an additional recurrence in the RUL which was treated with SBRT completed on 07/02/2023, now with an enlarging subcarinal lymph node in July of 2025 which was also treated with radiation therapy completed on 11/04/2023    History of prostate cancer also diagnosed in 2013, s/p Calypso treatment  The patient has healed well from the effects of his radiation treatment. Radiation follow-up PRN. We appreciate the opportunity to take part in this patient's care.   Per patient preference, he will continue with follow-up closer to home. His previous medical oncologist, Dr. Ivana is no longer practicing in [place]. Referral for follow-up with Dr. Davonna placed today.     20 minutes of total time was spent for this patient encounter, including preparation, face-to-face counseling with the patient and coordination of care, physical exam, and documentation of the encounter. ____________________________________   Leeroy Due, PA-C   This document serves as a record of services personally performed by Leeroy Due, PA-C. It was created on her behalf by Dorthy Fuse, a trained medical scribe. The creation of this record is based on the scribe's personal observations and the provider's statements to them. This document has been checked and approved by  the attending provider.

## 2023-12-09 ENCOUNTER — Encounter: Payer: Self-pay | Admitting: Radiation Oncology

## 2023-12-09 ENCOUNTER — Ambulatory Visit
Admission: RE | Admit: 2023-12-09 | Discharge: 2023-12-09 | Disposition: A | Discharge status: Home / Self Care | Source: Ambulatory Visit | Attending: Radiation Oncology | Admitting: Radiation Oncology

## 2023-12-09 VITALS — BP 139/69 | HR 88 | Temp 97.3°F | Resp 18 | Ht 60.0 in | Wt 153.5 lb

## 2023-12-09 DIAGNOSIS — R590 Localized enlarged lymph nodes: Secondary | ICD-10-CM | POA: Insufficient documentation

## 2023-12-09 DIAGNOSIS — Z8546 Personal history of malignant neoplasm of prostate: Secondary | ICD-10-CM | POA: Insufficient documentation

## 2023-12-09 DIAGNOSIS — Z79899 Other long term (current) drug therapy: Secondary | ICD-10-CM | POA: Diagnosis not present

## 2023-12-09 DIAGNOSIS — C3491 Malignant neoplasm of unspecified part of right bronchus or lung: Secondary | ICD-10-CM

## 2023-12-09 DIAGNOSIS — C3412 Malignant neoplasm of upper lobe, left bronchus or lung: Secondary | ICD-10-CM | POA: Insufficient documentation

## 2023-12-09 DIAGNOSIS — Z7982 Long term (current) use of aspirin: Secondary | ICD-10-CM | POA: Insufficient documentation

## 2023-12-09 DIAGNOSIS — C3411 Malignant neoplasm of upper lobe, right bronchus or lung: Secondary | ICD-10-CM | POA: Insufficient documentation

## 2023-12-09 DIAGNOSIS — Z923 Personal history of irradiation: Secondary | ICD-10-CM | POA: Insufficient documentation

## 2023-12-09 NOTE — Progress Notes (Signed)
 Eric Hinton is here today for follow up post radiation to the lung.  Lung Side: 11/04/23  Does the patient complain of any of the following: Pain:No Shortness of breath w/wo exertion: Yes, mostly on exertion.  Cough: Yes , productive Hemoptysis: No Pain with swallowing: No Swallowing/choking concerns: No Appetite: Good Energy Level:  Fair Post radiation skin Changes: No    Additional comments if applicable:   BP 139/69 (BP Location: Left Arm, Patient Position: Sitting)   Pulse 88   Temp (!) 97.3 F (36.3 C) (Temporal)   Resp 18   Ht 5' (1.524 m)   Wt 153 lb 8 oz (69.6 kg)   SpO2 98%   BMI 29.98 kg/m

## 2023-12-16 DIAGNOSIS — I2510 Atherosclerotic heart disease of native coronary artery without angina pectoris: Secondary | ICD-10-CM | POA: Diagnosis not present

## 2023-12-16 DIAGNOSIS — Z8546 Personal history of malignant neoplasm of prostate: Secondary | ICD-10-CM | POA: Diagnosis not present

## 2023-12-16 DIAGNOSIS — R7301 Impaired fasting glucose: Secondary | ICD-10-CM | POA: Diagnosis not present

## 2023-12-16 DIAGNOSIS — H3530 Unspecified macular degeneration: Secondary | ICD-10-CM | POA: Diagnosis not present

## 2023-12-16 DIAGNOSIS — Z720 Tobacco use: Secondary | ICD-10-CM | POA: Diagnosis not present

## 2023-12-16 DIAGNOSIS — J449 Chronic obstructive pulmonary disease, unspecified: Secondary | ICD-10-CM | POA: Diagnosis not present

## 2023-12-16 DIAGNOSIS — R944 Abnormal results of kidney function studies: Secondary | ICD-10-CM | POA: Diagnosis not present

## 2023-12-16 DIAGNOSIS — Z85828 Personal history of other malignant neoplasm of skin: Secondary | ICD-10-CM | POA: Diagnosis not present

## 2023-12-16 DIAGNOSIS — I10 Essential (primary) hypertension: Secondary | ICD-10-CM | POA: Diagnosis not present

## 2023-12-16 DIAGNOSIS — I998 Other disorder of circulatory system: Secondary | ICD-10-CM | POA: Diagnosis not present

## 2023-12-16 DIAGNOSIS — E782 Mixed hyperlipidemia: Secondary | ICD-10-CM | POA: Diagnosis not present

## 2023-12-16 DIAGNOSIS — C3411 Malignant neoplasm of upper lobe, right bronchus or lung: Secondary | ICD-10-CM | POA: Diagnosis not present

## 2024-01-30 DIAGNOSIS — H401133 Primary open-angle glaucoma, bilateral, severe stage: Secondary | ICD-10-CM | POA: Diagnosis not present

## 2024-01-30 DIAGNOSIS — H43813 Vitreous degeneration, bilateral: Secondary | ICD-10-CM | POA: Diagnosis not present

## 2024-01-30 DIAGNOSIS — H35372 Puckering of macula, left eye: Secondary | ICD-10-CM | POA: Diagnosis not present

## 2024-01-30 DIAGNOSIS — T8522XA Displacement of intraocular lens, initial encounter: Secondary | ICD-10-CM | POA: Diagnosis not present

## 2024-01-30 DIAGNOSIS — H43393 Other vitreous opacities, bilateral: Secondary | ICD-10-CM | POA: Diagnosis not present

## 2024-01-30 DIAGNOSIS — H4311 Vitreous hemorrhage, right eye: Secondary | ICD-10-CM | POA: Diagnosis not present

## 2024-01-30 DIAGNOSIS — H2702 Aphakia, left eye: Secondary | ICD-10-CM | POA: Diagnosis not present

## 2024-01-30 DIAGNOSIS — H442E3 Degenerative myopia with other maculopathy, bilateral eye: Secondary | ICD-10-CM | POA: Diagnosis not present

## 2024-02-05 ENCOUNTER — Other Ambulatory Visit: Payer: Self-pay

## 2024-02-05 DIAGNOSIS — C3491 Malignant neoplasm of unspecified part of right bronchus or lung: Secondary | ICD-10-CM

## 2024-02-06 ENCOUNTER — Ambulatory Visit (HOSPITAL_COMMUNITY)
Admission: RE | Admit: 2024-02-06 | Discharge: 2024-02-06 | Disposition: A | Discharge status: Home / Self Care | Source: Ambulatory Visit | Attending: Hematology | Admitting: Hematology

## 2024-02-06 ENCOUNTER — Inpatient Hospital Stay: Attending: Hematology

## 2024-02-06 DIAGNOSIS — J479 Bronchiectasis, uncomplicated: Secondary | ICD-10-CM | POA: Diagnosis not present

## 2024-02-06 DIAGNOSIS — Z85118 Personal history of other malignant neoplasm of bronchus and lung: Secondary | ICD-10-CM | POA: Diagnosis not present

## 2024-02-06 DIAGNOSIS — C3490 Malignant neoplasm of unspecified part of unspecified bronchus or lung: Secondary | ICD-10-CM | POA: Diagnosis not present

## 2024-02-06 DIAGNOSIS — C3491 Malignant neoplasm of unspecified part of right bronchus or lung: Secondary | ICD-10-CM | POA: Insufficient documentation

## 2024-02-06 DIAGNOSIS — Z08 Encounter for follow-up examination after completed treatment for malignant neoplasm: Secondary | ICD-10-CM | POA: Insufficient documentation

## 2024-02-06 DIAGNOSIS — J432 Centrilobular emphysema: Secondary | ICD-10-CM | POA: Diagnosis not present

## 2024-02-06 DIAGNOSIS — Z8546 Personal history of malignant neoplasm of prostate: Secondary | ICD-10-CM | POA: Diagnosis not present

## 2024-02-06 DIAGNOSIS — R911 Solitary pulmonary nodule: Secondary | ICD-10-CM | POA: Diagnosis not present

## 2024-02-06 LAB — CBC WITH DIFFERENTIAL/PLATELET
Abs Immature Granulocytes: 0.03 K/uL (ref 0.00–0.07)
Basophils Absolute: 0 K/uL (ref 0.0–0.1)
Basophils Relative: 1 %
Eosinophils Absolute: 0.2 K/uL (ref 0.0–0.5)
Eosinophils Relative: 3 %
HCT: 37.9 % — ABNORMAL LOW (ref 39.0–52.0)
Hemoglobin: 13 g/dL (ref 13.0–17.0)
Immature Granulocytes: 1 %
Lymphocytes Relative: 21 %
Lymphs Abs: 1.2 K/uL (ref 0.7–4.0)
MCH: 32.3 pg (ref 26.0–34.0)
MCHC: 34.3 g/dL (ref 30.0–36.0)
MCV: 94.3 fL (ref 80.0–100.0)
Monocytes Absolute: 0.7 K/uL (ref 0.1–1.0)
Monocytes Relative: 12 %
Neutro Abs: 3.5 K/uL (ref 1.7–7.7)
Neutrophils Relative %: 62 %
Platelets: 197 K/uL (ref 150–400)
RBC: 4.02 MIL/uL — ABNORMAL LOW (ref 4.22–5.81)
RDW: 13.2 % (ref 11.5–15.5)
WBC: 5.6 K/uL (ref 4.0–10.5)
nRBC: 0 % (ref 0.0–0.2)

## 2024-02-06 LAB — COMPREHENSIVE METABOLIC PANEL WITH GFR
ALT: 18 U/L (ref 0–44)
AST: 16 U/L (ref 15–41)
Albumin: 4 g/dL (ref 3.5–5.0)
Alkaline Phosphatase: 78 U/L (ref 38–126)
Anion gap: 11 (ref 5–15)
BUN: 18 mg/dL (ref 8–23)
CO2: 26 mmol/L (ref 22–32)
Calcium: 9.4 mg/dL (ref 8.9–10.3)
Chloride: 103 mmol/L (ref 98–111)
Creatinine, Ser: 1.12 mg/dL (ref 0.61–1.24)
GFR, Estimated: >60 mL/min (ref >60)
Glucose, Bld: 87 mg/dL (ref 70–99)
Potassium: 4.3 mmol/L (ref 3.5–5.1)
Sodium: 140 mmol/L (ref 135–145)
Total Bilirubin: 0.5 mg/dL (ref 0.0–1.2)
Total Protein: 6.9 g/dL (ref 6.5–8.1)

## 2024-02-06 MED ORDER — IOHEXOL 300 MG/ML  SOLN
100.0000 mL | Freq: Once | INTRAMUSCULAR | Status: AC | PRN
  Administered 2024-02-06: 100 mL via INTRAVENOUS

## 2024-02-12 ENCOUNTER — Inpatient Hospital Stay: Admitting: Oncology

## 2024-02-12 DIAGNOSIS — C3491 Malignant neoplasm of unspecified part of right bronchus or lung: Secondary | ICD-10-CM | POA: Diagnosis not present

## 2024-02-12 DIAGNOSIS — Z8546 Personal history of malignant neoplasm of prostate: Secondary | ICD-10-CM | POA: Diagnosis not present

## 2024-02-12 DIAGNOSIS — Z85118 Personal history of other malignant neoplasm of bronchus and lung: Secondary | ICD-10-CM | POA: Diagnosis not present

## 2024-02-12 DIAGNOSIS — Z08 Encounter for follow-up examination after completed treatment for malignant neoplasm: Secondary | ICD-10-CM | POA: Diagnosis not present

## 2024-02-12 NOTE — Progress Notes (Signed)
 [name] at [facility]  Hematology Follow-up Virtual Visit via Telephone Note  Eric Norleen PEDLAR, MD  I connected with Eric Hinton on 02/13/24 at 10:20 AM EST by telephone and verified that I am speaking with the correct person using two identifiers.  Location: Patient: At home Provider: At office  REASON FOR FOLLOW-UP: Non small cell lung carcinoma  ASSESSMENT & PLAN:  Patient is a 83 y.o. male following for non small cell lung carcinoma   Assessment and Plan Assessment & Plan Multiple recurrent non-small cell lung carcinoma Extensive oncology history below Patient was initially diagnosed in 2013 followed by chemoRT, followed by 2 recurrences with recent one in June 2025 followed by SBRT of the subcarinal lymph node  - I discussed the recent CT scan findings with the patient.  The subpleural nodule adjacent to the fiducial markers is slightly larger in size compared to prior, this was not FDG avid on previous PET scan. -Will obtain a PET scan in 3 months to follow-up on this - He follows with Dr. Shannon with radiation oncology   Return to clinic in 3 months to discuss results of PET scan.  Acute hip pain and contusion after fall, possible hip injury Acute hip pain and contusion post-fall, no fractures suspected, persistent pain.  - Continue Aleve for pain management. - Consider hip x-ray if pain persists.  Tobacco use Patient is an extensive tobacco user using 2 to 3 packs of cigarettes per day  Discussed treatment and was advised of smoking.  Patient is reluctant to quit.    Orders Placed This Encounter  Procedures   NM PET Image Restag (PS) Skull Base To Thigh    Standing Status:   Future    Expected Date:   05/15/2024    Expiration Date:   02/12/2025    If indicated for the ordered procedure, I authorize the administration of a radiopharmaceutical per Radiology protocol:   Yes    Preferred imaging location?:   Eric Hinton    I  discussed the assessment and treatment plan with the patient. The patient was provided an opportunity to ask questions and all were answered. The patient agreed with the plan and demonstrated an understanding of the instructions.   The patient was advised to call back or seek an in-person evaluation if the symptoms worsen or if the condition fails to improve as anticipated.  I provided 20 minutes of non-face-to-face time during this encounter.   Mickiel Dry, MD 11/20/202512:02 AM    SUMMARY OF ONCOLOGY HISTORY: Diagnosis: Squamous cell carcinoma of the right upper lobe of the lung and left upper lobe of the lung   -History of pulmonary nodules since 2004 per documentation -2013: Initial diagnosis of SCC of RUL in [facility] [place] [place] . S/p concurrent CRT with carboplatin and paclitaxel weekly.  -02/19/2012: MRI Brain: NED -02/25/2012 - 04/29/2012: Radiation therapy completed -02/28/2012 - 04/17/2012: Chemotherapy with weekly carboplatin and paclitaxel, complicated with thrombocytopenia and hospitalization resulting in not completing chemotherapy after 2 cycles of dose reduced chemo  -07/2015 - 01/2022: Imaging showing stable to NED -02/20/2022: CT chest: Small nodules in the LEFT upper lobe 1 at 6 in the other a 4 mm. These are nonspecific but new when compared to previous imaging. -09/04/2022: CT chest: Enlarged right hilar lymph node is slightly increased in size and concerning for metastatic disease. Irregular solid left upper lobe pulmonary nodule is increased in size and concerning for synchronous primary malignancy. Could also be further  evaluated with PET-CT. New part solid nodule of the right upper lobe.  -09/20/2022: PET: The right hilar/infrahilar node is mildly hypermetabolic and suspicious for nodal metastasis, especially given interval enlargement over prior CTs. Hypermetabolic left upper lobe pulmonary nodule, favoring metachronous primary bronchogenic carcinoma. Low-level  activity corresponding to a posterolateral right upper lobe mixed attenuation nodule. Indeterminate but suspicious for metachronous primary. No tracer avid distant metastasis. Deep left parotid hypermetabolic 4 mm nodule is favored to represent an incidental primary such as a Warthin's tumor. -12/11/2022: Bronchoscopy and biopsies of LUL and RUL nodules.  Cytology: Squamous cell carcinoma of both lung nodules. Tumor cells positive for p40 and are negative for TTF-1. Lymph node stations 7 and 11R negative for malignancy.  -01/22/2023 - 01/28/2023: SBRT to LUL pulmonary lesion -04/23/2023: CT chest: Since the prior study, there is a new 7 x 14 mm opacity in the right upper lobe, which is highly concerning for local recurrent tumor. There are other two, 5 mm or smaller opacities in the right lower lobe and left lower lobe, as described above, which are new since the prior study. -05/16/2023: PET: The inferior right upper lobe 1.2 cm pulmonary nodule on the 04/23/2023 chest CT is hypermetabolic, consistent with metastasis or metachronous primary. Although the right hilar nodal hypermetabolism has resolved, there is a newly hypermetabolic small subcarinal node, favoring nodal metastasis. No distant hypermetabolic metastasis. - 06/25/2023 - 07/02/2023: SBRT to RUL pulmonary lesion -09/17/2023: CT chest: Further enlargement of the subcarinal lymph node previously showing uptake. Area of potential worsening disease. Stable prominent right hilar node. This was not hypermetabolic. No new areas of nodal enlargement at this time. The hypermetabolic nodule along the inferior right upper lobe has markedly improved. There are several other areas of lung nodularity which are similar to previous. -10/21/2023 - 11/04/2023: Radiation therapy to subcarinal lymph node -02/06/2024: PET: The subpleural nodule laterally at the left lung apex adjacent to a fiducial marker appears slightly larger. There was no hypermetabolic  activity in this area on prior PET-CT, and findings could be secondary to interval treatment or local recurrence. Stable radiation changes in the right upper lobe with perihilar fibrosis, architectural distortion and bronchiectasis. No residual nodule inferiorly in the right upper lobe corresponding with the hypermetabolic lesion on previous PET-CT. No new or enlarging right lung nodules identified. Stable mildly prominent right hilar lymph node without significant hypermetabolic activity on PET-CT. No evidence of distant metastatic disease.    Prostate Cancer  -2013: Initial diagnosis. S/p seed implant and Calypso treatment with 5 treatments of radiation -2017 - 08/2022: PSA within normal range  INTERVAL HISTORY:  I discussed the limitations, risks, security and privacy concerns of performing an evaluation and management service by telephone and the availability of in person appointments. I also discussed with the patient that there may be a patient responsible charge related to this service. The patient expressed understanding and agreed to proceed.   Discussed the use of AI scribe software for clinical note transcription with the patient, who gave verbal consent to proceed.  History of Present Illness Eric Hinton is an 83 year old male who presents for follow up for his lung carcinoma.   He experienced pain following a fall that occurred yesterday. The pain is described as a 'stiff bruise'. He plans to take Aleve to manage the discomfort. No other complaints aside from pain from the fall.  This is the reason he was not able to make it in person for the visit.  He has a history of lung cancer treated with chest radiation, having undergone radiation therapy three times due to recurrence. A recent CT scan indicated that one of the areas might be slightly larger.  He was diagnosed with prostate cancer in 2014, which has been treated. His PSA levels are monitored every three to  four months by his primary care.  He continues to smoke two to three packs of cigarettes per day and enjoys coffee and tea. He reports feeling generally well for his age, aside from the recent fall.   I have reviewed the past medical history, past surgical history, social history and family history with the patient   ALLERGIES:  is allergic to bupropion.  MEDICATIONS:  Current Outpatient Medications  Medication Sig Dispense Refill   albuterol (VENTOLIN HFA) 108 (90 Base) MCG/ACT inhaler Inhale 1 puff into the lungs every 6 (six) hours as needed.     aspirin  325 MG tablet Take 1 tablet (325 mg total) by mouth daily.     atorvastatin  (LIPITOR) 10 MG tablet Take 10 mg by mouth daily.     bevacizumab  (AVASTIN ) 400 MG/16ML SOLN 1.25 mg every 8 (eight) weeks. Left eye     Cyanocobalamin  (VITAMIN B-12) 2500 MCG SUBL Place 2,500 mcg under the tongue daily.     FLUoxetine (PROZAC) 40 MG capsule Take 40 mg by mouth daily.     latanoprost  (XALATAN ) 0.005 % ophthalmic solution Place 1 drop into both eyes at bedtime.     menthol-zinc oxide (GOLD  BOND) powder Apply 1 application topically daily as needed (for itching).     Multiple Vitamins-Minerals (PRESERVISION AREDS 2) CAPS Take 1 capsule by mouth 2 (two) times daily.     naproxen sodium (ANAPROX) 220 MG tablet Take 440 mg by mouth 2 (two) times daily as needed (for pain).     tamsulosin  (FLOMAX ) 0.4 MG CAPS capsule TAKE 1 CAPSULE BY MOUTH DAILY 30 capsule 0   telmisartan  (MICARDIS ) 40 MG tablet Take 40 mg by mouth daily.     No current facility-administered medications for this visit.     REVIEW OF SYSTEMS:   Constitutional: Denies fevers, chills or night sweats Eyes: Denies blurriness of vision Ears, nose, mouth, throat, and face: Denies mucositis or sore throat Respiratory: Denies cough, dyspnea or wheezes Cardiovascular: Denies palpitation, chest discomfort or lower extremity swelling Gastrointestinal:  Denies nausea, heartburn or change  in bowel habits Skin: Denies abnormal skin rashes Lymphatics: Denies new lymphadenopathy or easy bruising Neurological:Denies numbness, tingling or new weaknesses Behavioral/Psych: Mood is stable, no new changes  All other systems were reviewed with the patient and are negative.  PHYSICAL EXAMINATION: Deferred for virtual visit  LABORATORY DATA:  I have reviewed the data as listed  Lab Results  Component Value Date   WBC 5.6 02/06/2024   NEUTROABS 3.5 02/06/2024   HGB 13.0 02/06/2024   HCT 37.9 (L) 02/06/2024   MCV 94.3 02/06/2024   PLT 197 02/06/2024      Component Value Date/Time   NA 140 02/06/2024 1253   NA 139 08/03/2016 1029   K 4.3 02/06/2024 1253   K 4.8 08/03/2016 1029   CL 103 02/06/2024 1253   CO2 26 02/06/2024 1253   CO2 23 08/03/2016 1029   GLUCOSE 87 02/06/2024 1253   GLUCOSE 103 08/03/2016 1029   BUN 18 02/06/2024 1253   BUN 19.4 08/03/2016 1029   CREATININE 1.12 02/06/2024 1253   CREATININE 1.05 02/15/2020 1106   CREATININE 1.1 08/03/2016 1029  CALCIUM  9.4 02/06/2024 1253   CALCIUM  9.8 08/03/2016 1029   PROT 6.9 02/06/2024 1253   PROT 7.0 08/03/2016 1029   ALBUMIN  4.0 02/06/2024 1253   ALBUMIN  3.9 08/03/2016 1029   AST 16 02/06/2024 1253   AST 17 02/15/2020 1106   AST 17 08/03/2016 1029   ALT 18 02/06/2024 1253   ALT 21 02/15/2020 1106   ALT 24 08/03/2016 1029   ALKPHOS 78 02/06/2024 1253   ALKPHOS 67 08/03/2016 1029   BILITOT 0.5 02/06/2024 1253   BILITOT 0.5 02/15/2020 1106   BILITOT 0.65 08/03/2016 1029   GFRNONAA >60 02/06/2024 1253   GFRNONAA >60 02/15/2020 1106   GFRAA >60 02/12/2019 0854     RADIOGRAPHIC STUDIES: I have personally reviewed the radiological images as listed and agreed with the findings in the report.  CT CHEST W CONTRAST CLINICAL DATA:  Non-small cell lung cancer, monitor. * Tracking Code: BO *  EXAM: CT CHEST WITH CONTRAST  TECHNIQUE: Multidetector CT imaging of the chest was performed during intravenous  contrast administration.  RADIATION DOSE REDUCTION: This exam was performed according to the departmental dose-optimization program which includes automated exposure control, adjustment of the mA and/or kV according to patient size and/or use of iterative reconstruction technique.  CONTRAST:  OMNIPAQUE  IOHEXOL  300 MG/ML  SOLN  COMPARISON:  Chest CT 09/17/2023 and 04/23/2023.  PET-CT 05/16/2023.  FINDINGS: Cardiovascular: No acute vascular findings are identified. There is diffuse atherosclerosis of the aorta, great vessels and coronary arteries. Suboptimal opacification of the pulmonary arteries without evidence of acute abnormality. There are calcifications of the aortic valve and mitral annulus. Chronic narrowing of the right internal jugular vein appears unchanged. The heart size is normal. There is no pericardial effusion.  Mediastinum/Nodes: Stable mildly prominent right hilar lymph node, measuring 1.3 cm on image 64/2, without significant hypermetabolic activity on PET-CT. No other enlarged mediastinal, hilar or axillary lymph nodes.Stable soft tissue thickening around the right hilum. The thyroid  gland, trachea and esophagus demonstrate no significant findings.  Lungs/Pleura: No pleural effusion or pneumothorax. Centrilobular and paraseptal emphysema with patchy ground-glass opacities consistent with smoking related lung disease. There are stable radiation changes with perihilar fibrosis, architectural distortion and bronchiectasis in the right upper lobe. There is no residual nodule inferiorly in the right upper lobe corresponding with the hypermetabolic lesion on previous PET-CT. Additional areas of scarring are present anteriorly in the right upper and middle lobes and peripherally at the right lung apex, unchanged. The subpleural nodule laterally at the left lung apex adjacent to a fiducial marker appears slightly larger, measuring 1.3 x 1.1 cm on image  28/4 (previously 0.9 x 0.8 cm. No other enlarging nodules are identified.  Upper abdomen: No significant findings are demonstrated within the visualized upper abdomen. There are stable hepatic and left renal cysts for which no specific follow-up imaging is recommended.  Musculoskeletal/Chest wall: There is no chest wall mass or suspicious osseous finding. No evidence of osseous metastatic disease. Mild multilevel spondylosis.  IMPRESSION: 1. The subpleural nodule laterally at the left lung apex adjacent to a fiducial marker appears slightly larger. There was no hypermetabolic activity in this area on prior PET-CT, and findings could be secondary to interval treatment or local recurrence. 2. Stable radiation changes in the right upper lobe with perihilar fibrosis, architectural distortion and bronchiectasis. No residual nodule inferiorly in the right upper lobe corresponding with the hypermetabolic lesion on previous PET-CT. No new or enlarging right lung nodules identified. 3. Stable mildly prominent right  hilar lymph node without significant hypermetabolic activity on PET-CT. 4. No evidence of distant metastatic disease. 5. Aortic Atherosclerosis (ICD10-I70.0) and Emphysema (ICD10-J43.9).  Electronically Signed   By: Elsie Perone M.D.   On: 02/08/2024 14:58

## 2024-03-09 ENCOUNTER — Other Ambulatory Visit: Payer: Self-pay

## 2024-03-09 ENCOUNTER — Inpatient Hospital Stay (HOSPITAL_COMMUNITY)
Admission: EM | Admit: 2024-03-09 | Discharge: 2024-03-13 | DRG: 481 | Disposition: A | Discharge status: Skilled Nursing Facility | Attending: Internal Medicine | Admitting: Internal Medicine

## 2024-03-09 ENCOUNTER — Inpatient Hospital Stay (HOSPITAL_COMMUNITY)

## 2024-03-09 ENCOUNTER — Encounter (HOSPITAL_COMMUNITY): Payer: Self-pay | Admitting: Emergency Medicine

## 2024-03-09 ENCOUNTER — Emergency Department (HOSPITAL_COMMUNITY)

## 2024-03-09 DIAGNOSIS — M5030 Other cervical disc degeneration, unspecified cervical region: Secondary | ICD-10-CM | POA: Diagnosis present

## 2024-03-09 DIAGNOSIS — E8721 Acute metabolic acidosis: Secondary | ICD-10-CM | POA: Diagnosis present

## 2024-03-09 DIAGNOSIS — Z8249 Family history of ischemic heart disease and other diseases of the circulatory system: Secondary | ICD-10-CM | POA: Diagnosis not present

## 2024-03-09 DIAGNOSIS — D6489 Other specified anemias: Secondary | ICD-10-CM | POA: Diagnosis present

## 2024-03-09 DIAGNOSIS — Z85118 Personal history of other malignant neoplasm of bronchus and lung: Secondary | ICD-10-CM

## 2024-03-09 DIAGNOSIS — Z823 Family history of stroke: Secondary | ICD-10-CM | POA: Diagnosis not present

## 2024-03-09 DIAGNOSIS — Z9221 Personal history of antineoplastic chemotherapy: Secondary | ICD-10-CM

## 2024-03-09 DIAGNOSIS — Z7982 Long term (current) use of aspirin: Secondary | ICD-10-CM

## 2024-03-09 DIAGNOSIS — Z833 Family history of diabetes mellitus: Secondary | ICD-10-CM | POA: Diagnosis not present

## 2024-03-09 DIAGNOSIS — Z8546 Personal history of malignant neoplasm of prostate: Secondary | ICD-10-CM

## 2024-03-09 DIAGNOSIS — W010XXA Fall on same level from slipping, tripping and stumbling without subsequent striking against object, initial encounter: Secondary | ICD-10-CM | POA: Diagnosis present

## 2024-03-09 DIAGNOSIS — Z79899 Other long term (current) drug therapy: Secondary | ICD-10-CM

## 2024-03-09 DIAGNOSIS — Y92008 Other place in unspecified non-institutional (private) residence as the place of occurrence of the external cause: Secondary | ICD-10-CM

## 2024-03-09 DIAGNOSIS — I10 Essential (primary) hypertension: Secondary | ICD-10-CM | POA: Diagnosis present

## 2024-03-09 DIAGNOSIS — Z85828 Personal history of other malignant neoplasm of skin: Secondary | ICD-10-CM

## 2024-03-09 DIAGNOSIS — F17210 Nicotine dependence, cigarettes, uncomplicated: Secondary | ICD-10-CM | POA: Diagnosis present

## 2024-03-09 DIAGNOSIS — E860 Dehydration: Secondary | ICD-10-CM | POA: Diagnosis present

## 2024-03-09 DIAGNOSIS — Z8781 Personal history of (healed) traumatic fracture: Principal | ICD-10-CM

## 2024-03-09 DIAGNOSIS — N400 Enlarged prostate without lower urinary tract symptoms: Secondary | ICD-10-CM | POA: Diagnosis present

## 2024-03-09 DIAGNOSIS — M51369 Other intervertebral disc degeneration, lumbar region without mention of lumbar back pain or lower extremity pain: Secondary | ICD-10-CM | POA: Diagnosis present

## 2024-03-09 DIAGNOSIS — M47816 Spondylosis without myelopathy or radiculopathy, lumbar region: Secondary | ICD-10-CM | POA: Diagnosis not present

## 2024-03-09 DIAGNOSIS — F32A Depression, unspecified: Secondary | ICD-10-CM | POA: Diagnosis present

## 2024-03-09 DIAGNOSIS — J449 Chronic obstructive pulmonary disease, unspecified: Secondary | ICD-10-CM | POA: Diagnosis present

## 2024-03-09 DIAGNOSIS — S72002A Fracture of unspecified part of neck of left femur, initial encounter for closed fracture: Secondary | ICD-10-CM | POA: Diagnosis not present

## 2024-03-09 DIAGNOSIS — S72142A Displaced intertrochanteric fracture of left femur, initial encounter for closed fracture: Secondary | ICD-10-CM | POA: Diagnosis present

## 2024-03-09 DIAGNOSIS — H548 Legal blindness, as defined in USA: Secondary | ICD-10-CM | POA: Diagnosis present

## 2024-03-09 DIAGNOSIS — Z923 Personal history of irradiation: Secondary | ICD-10-CM

## 2024-03-09 DIAGNOSIS — E785 Hyperlipidemia, unspecified: Secondary | ICD-10-CM | POA: Diagnosis present

## 2024-03-09 DIAGNOSIS — M25552 Pain in left hip: Secondary | ICD-10-CM | POA: Diagnosis present

## 2024-03-09 DIAGNOSIS — R918 Other nonspecific abnormal finding of lung field: Secondary | ICD-10-CM | POA: Diagnosis not present

## 2024-03-09 LAB — CBC WITH DIFFERENTIAL/PLATELET
Abs Immature Granulocytes: 0.03 K/uL (ref 0.00–0.07)
Basophils Absolute: 0 K/uL (ref 0.0–0.1)
Basophils Relative: 0 %
Eosinophils Absolute: 0 K/uL (ref 0.0–0.5)
Eosinophils Relative: 1 %
HCT: 38.5 % — ABNORMAL LOW (ref 39.0–52.0)
Hemoglobin: 12.8 g/dL — ABNORMAL LOW (ref 13.0–17.0)
Immature Granulocytes: 0 %
Lymphocytes Relative: 6 %
Lymphs Abs: 0.5 K/uL — ABNORMAL LOW (ref 0.7–4.0)
MCH: 31.2 pg (ref 26.0–34.0)
MCHC: 33.2 g/dL (ref 30.0–36.0)
MCV: 93.9 fL (ref 80.0–100.0)
Monocytes Absolute: 0.4 K/uL (ref 0.1–1.0)
Monocytes Relative: 5 %
Neutro Abs: 7.5 K/uL (ref 1.7–7.7)
Neutrophils Relative %: 88 %
Platelets: 219 K/uL (ref 150–400)
RBC: 4.1 MIL/uL — ABNORMAL LOW (ref 4.22–5.81)
RDW: 13.6 % (ref 11.5–15.5)
WBC: 8.5 K/uL (ref 4.0–10.5)
nRBC: 0 % (ref 0.0–0.2)

## 2024-03-09 LAB — COMPREHENSIVE METABOLIC PANEL WITH GFR
ALT: 19 U/L (ref 0–44)
AST: 18 U/L (ref 15–41)
Albumin: 4.1 g/dL (ref 3.5–5.0)
Alkaline Phosphatase: 123 U/L (ref 38–126)
Anion gap: 14 (ref 5–15)
BUN: 22 mg/dL (ref 8–23)
CO2: 20 mmol/L — ABNORMAL LOW (ref 22–32)
Calcium: 9.4 mg/dL (ref 8.9–10.3)
Chloride: 104 mmol/L (ref 98–111)
Creatinine, Ser: 0.97 mg/dL (ref 0.61–1.24)
GFR, Estimated: >60 mL/min (ref >60)
Glucose, Bld: 114 mg/dL — ABNORMAL HIGH (ref 70–99)
Potassium: 4.3 mmol/L (ref 3.5–5.1)
Sodium: 137 mmol/L (ref 135–145)
Total Bilirubin: 0.4 mg/dL (ref 0.0–1.2)
Total Protein: 6.7 g/dL (ref 6.5–8.1)

## 2024-03-09 LAB — TYPE AND SCREEN
ABO/RH(D): A POS
Antibody Screen: NEGATIVE

## 2024-03-09 MED ORDER — TAMSULOSIN HCL 0.4 MG PO CAPS
0.4000 mg | ORAL_CAPSULE | Freq: Every evening | ORAL | Status: DC
  Administered 2024-03-10 – 2024-03-12 (×3): 0.4 mg via ORAL
  Filled 2024-03-09 (×3): qty 1

## 2024-03-09 MED ORDER — IPRATROPIUM-ALBUTEROL 0.5-2.5 (3) MG/3ML IN SOLN: 3.0000 mL | RESPIRATORY_TRACT | Status: DC | PRN

## 2024-03-09 MED ORDER — MORPHINE SULFATE (PF) 2 MG/ML IV SOLN: 2.0000 mg | INTRAVENOUS | Status: DC | PRN

## 2024-03-09 MED ORDER — ACETAMINOPHEN 325 MG PO TABS: 650.0000 mg | ORAL_TABLET | Freq: Four times a day (QID) | ORAL | Status: DC | PRN

## 2024-03-09 MED ORDER — ASPIRIN 325 MG PO TABS: 325.0000 mg | ORAL_TABLET | Freq: Every day | ORAL | Status: DC

## 2024-03-09 MED ORDER — ATORVASTATIN CALCIUM 10 MG PO TABS: 10.0000 mg | ORAL_TABLET | Freq: Every evening | ORAL | Status: DC

## 2024-03-09 MED ORDER — SODIUM CHLORIDE 0.9 % IV SOLN: INTRAVENOUS | Status: DC

## 2024-03-09 MED ORDER — MORPHINE SULFATE (PF) 2 MG/ML IV SOLN
2.0000 mg | INTRAVENOUS | Status: DC | PRN
  Administered 2024-03-09 – 2024-03-10 (×2): 2 mg via INTRAVENOUS
  Filled 2024-03-09 (×2): qty 1

## 2024-03-09 MED ORDER — IRBESARTAN 75 MG PO TABS
75.0000 mg | ORAL_TABLET | Freq: Every day | ORAL | Status: DC
  Administered 2024-03-11: 09:00:00 75 mg via ORAL
  Filled 2024-03-09: qty 1

## 2024-03-09 MED ORDER — FLUOXETINE HCL 20 MG PO CAPS
40.0000 mg | ORAL_CAPSULE | Freq: Every day | ORAL | Status: DC
  Administered 2024-03-10 – 2024-03-13 (×4): 40 mg via ORAL
  Filled 2024-03-09 (×4): qty 2

## 2024-03-09 NOTE — Consult Note (Incomplete)
 @LOGODEPT @   ORTHOPAEDIC CONSULTATION  REQUESTING PHYSICIAN: Dorinda Drue DASEN, MD  ASSESSMENT AND PLAN: 83 y.o. male w left hip frx. H/o copd, lung cancer s/p radiation, prostate cancer, post op ur retention, high risk for post op complications, guarded prognosis. Benefits of surgery outweigh risk   Rec: preop optimization by medical service and surgical fxation of the left hip with CMN (nail)   Chief Complaint: left hip pain   HPI Eric Hinton is a 83 y.o. male with  a left hip fracture.  He got up when he thought he heard someone at the door he caught his toe on something and fell and fractured his left hip he complains of severe left hip pain when he moves his hip.  Minimal pain at rest.  He has deformity of the left lower extremity with external rotation and shortening.  The pain is nonradiating  The fracture occurred on 09 March 2024  Past Medical History:  Diagnosis Date   Arthritis    Carotid artery disease    Status post bilateral CEA - Dr. Oris   Complication of anesthesia    unable to void afterwards   Constipation    COPD (chronic obstructive pulmonary disease) (HCC)    DDD (degenerative disc disease), cervical    Degenerative disc disease, lumbar    Essential hypertension    History of radiation therapy    Right lung-06/25/23-07/02/23- Dr. Lynwood Nasuti   History of radiation therapy    Right Lung-10/21/23-11/04/23- Dr. Lynwood Nasuti   Hyperlipidemia    Macular degeneration    Prostate cancer [facility])    Skin cancer, basal cell    Squamous cell lung cancer (HCC) 07/15/2015   Status post chemotherapy and XRT - Dr. Sherrod   Tubular adenoma of colon    Past Surgical History:  Procedure Laterality Date   BRONCHIAL BIOPSY  12/11/2022   Procedure: BRONCHIAL BIOPSIES;  Surgeon: Shelah Lamar RAMAN, MD;  Location: [facility] ENDOSCOPY;  Service: Pulmonary;;   BRONCHIAL BRUSHINGS  12/11/2022   Procedure: BRONCHIAL BRUSHINGS;  Surgeon: Shelah Lamar RAMAN, MD;  Location: [facility]  ENDOSCOPY;  Service: Pulmonary;;   BRONCHIAL NEEDLE ASPIRATION BIOPSY  12/11/2022   Procedure: BRONCHIAL NEEDLE ASPIRATION BIOPSIES;  Surgeon: Shelah Lamar RAMAN, MD;  Location: [facility] ENDOSCOPY;  Service: Pulmonary;;   BRONCHIAL WASHINGS  12/11/2022   Procedure: BRONCHIAL WASHINGS;  Surgeon: Shelah Lamar RAMAN, MD;  Location: [facility] ENDOSCOPY;  Service: Pulmonary;;   CATARACT EXTRACTION W/ INTRAOCULAR LENS  IMPLANT, BILATERAL Bilateral    COLONOSCOPY     COLONOSCOPY WITH PROPOFOL  N/A 05/17/2017   Procedure: COLONOSCOPY WITH PROPOFOL ;  Surgeon: Golda Claudis PENNER, MD;  Location: [facility] ENDO SUITE;  Service: Endoscopy;  Laterality: N/A;  10:15   ENDARTERECTOMY Right 08/29/2015   Procedure: RIGHT CAROTID ENDARTERECTOMY WITH PATCH ANGIOPLASTY;  Surgeon: Krystal JULIANNA Oris, MD;  Location: [facility] OR;  Service: Vascular;  Laterality: Right;   ENDARTERECTOMY Left 10/07/2015   Procedure: LEFT CAROTID ARTERY ENDARTERECTOMY;  Surgeon: Krystal JULIANNA Oris, MD;  Location: [facility] OR;  Service: Vascular;  Laterality: Left;   FIDUCIAL MARKER PLACEMENT  12/11/2022   Procedure: FIDUCIAL MARKER PLACEMENT;  Surgeon: Shelah Lamar RAMAN, MD;  Location: [facility] ENDOSCOPY;  Service: Pulmonary;;   FINE NEEDLE ASPIRATION  12/11/2022   Procedure: FINE NEEDLE ASPIRATION;  Surgeon: Shelah Lamar RAMAN, MD;  Location: [facility] ENDOSCOPY;  Service: Pulmonary;;   NOSE SURGERY     rebulit my nose; related to skin cancer   PATCH ANGIOPLASTY Left 10/07/2015   Procedure: With  HEMASHIELD PLATINUM PATCH ANGIOPLASTY;  Surgeon: Krystal JULIANNA Doing, MD;  Location: [facility] OR;  Service: Vascular;  Laterality: Left;   POLYPECTOMY  05/17/2017   Procedure: POLYPECTOMY;  Surgeon: Golda Claudis PENNER, MD;  Location: [facility] ENDO SUITE;  Service: Endoscopy;;  colon    PROSTATE BIOPSY     SKIN CANCER EXCISION Right    neck   TONSILLECTOMY     VIDEO BRONCHOSCOPY WITH ENDOBRONCHIAL ULTRASOUND N/A 12/11/2022   Procedure: VIDEO BRONCHOSCOPY WITH ENDOBRONCHIAL ULTRASOUND;  Surgeon: Shelah Lamar RAMAN, MD;  Location: [facility]  ENDOSCOPY;  Service: Pulmonary;  Laterality: N/A;   Social History   Socioeconomic History   Marital status: Married    Spouse name: Not on file   Number of children: Not on file   Years of education: Not on file   Highest education level: Not on file  Occupational History   Not on file  Tobacco Use   Smoking status: Every Day    Current packs/day: 2.00    Average packs/day: 2.0 packs/day for 77.9 years (155.9 ttl pk-yrs)    Types: Cigarettes    Start date: 04/[birth year]/1946   Smokeless tobacco: Never  Vaping Use   Vaping status: Never Used  Substance and Sexual Activity   Alcohol use: Not Currently    Alcohol/week: 35.0 standard drinks of alcohol    Types: 35 Cans of beer per week   Drug use: No   Sexual activity: Not on file  Other Topics Concern   Not on file  Social History Narrative   Not on file   Social Drivers of Health   Tobacco Use: High Risk (03/09/2024)   Patient History    Smoking Tobacco Use: Every Day    Smokeless Tobacco Use: Never    Passive Exposure: Not on file  Financial Resource Strain: Not on file  Food Insecurity: No Food Insecurity (06/10/2023)   Hunger Vital Sign    Worried About Running Out of Food in the Last Year: Never true    Ran Out of Food in the Last Year: Never true  Transportation Needs: No Transportation Needs (06/10/2023)   PRAPARE - Administrator, Civil Service (Medical): No    Lack of Transportation (Non-Medical): No  Physical Activity: Not on file  Stress: Not on file  Social Connections: Not on file  Depression (PHQ2-9): Low Risk (02/12/2024)   Depression (PHQ2-9)    PHQ-2 Score: 0  Alcohol Screen: Not on file  Housing: Low Risk (06/10/2023)   Housing Stability Vital Sign    Unable to Pay for Housing in the Last Year: No    Number of Times Moved in the Last Year: 0    Homeless in the Last Year: No  Utilities: Not At Risk (06/10/2023)   AHC Utilities    Threatened with loss of utilities: No  Health Literacy: Not on  file   Family History  Problem Relation Age of Onset   Cancer Mother    Stroke Father    Heart disease Father    Diabetes Mellitus II Sister    Allergies[1] Prior to Admission medications  Medication Sig Start Date End Date Taking? Authorizing Provider  albuterol  (VENTOLIN  HFA) 108 (90 Base) MCG/ACT inhaler Inhale 1 puff into the lungs every 6 (six) hours as needed. 11/06/22   [provider]  aspirin  325 MG tablet Take 1 tablet (325 mg total) by mouth daily. 05/18/17   Rehman, Claudis PENNER, MD  atorvastatin  (LIPITOR) 10 MG tablet Take 10 mg by  mouth daily.    [provider]  bevacizumab  (AVASTIN ) 400 MG/16ML SOLN 1.25 mg every 8 (eight) weeks. Left eye 10/09/16   [provider]  Cyanocobalamin  (VITAMIN B-12) 2500 MCG SUBL Place 2,500 mcg under the tongue daily.    [provider]  FLUoxetine  (PROZAC ) 40 MG capsule Take 40 mg by mouth daily. 12/16/23   [provider]  latanoprost  (XALATAN ) 0.005 % ophthalmic solution Place 1 drop into both eyes at bedtime.    [provider]  menthol -zinc oxide (GOLD  BOND) powder Apply 1 application topically daily as needed (for itching).    [provider]  Multiple Vitamins-Minerals (PRESERVISION AREDS 2) CAPS Take 1 capsule by mouth 2 (two) times daily.    [provider]  naproxen sodium (ANAPROX) 220 MG tablet Take 440 mg by mouth 2 (two) times daily as needed (for pain).    [provider]  tamsulosin  (FLOMAX ) 0.4 MG CAPS capsule TAKE 1 CAPSULE BY MOUTH DAILY 11/05/15   Sheree Penne Bruckner, MD  telmisartan  (MICARDIS ) 40 MG tablet Take 40 mg by mouth daily. 02/19/23   [provider]   DG Hip Unilat W or Wo Pelvis 2-3 Views Left Result Date: 03/09/2024 EXAM: 2 OR MORE VIEW(S) XRAY OF THE LEFT HIP 03/09/2024 07:08:44 PM COMPARISON: None available. CLINICAL HISTORY: fall FINDINGS: BONES AND JOINTS: Acute comminuted impacted left femoral intratrochanteric fracture.  Mild displacement of lesser trochanteric fragment. SOFT TISSUES: Vascular calcifications. LUMBAR SPINE: Degenerative changes of the lower lumbar spine. IMPRESSION: 1. Acute comminuted impacted left femoral intertrochanteric fracture with mild displacement of the lesser trochanteric fragment. 2. Degenerative changes of the lower lumbar spine. 3. Vascular calcifications. Electronically signed by: Greig Pique MD 03/09/2024 07:30 PM EST RP Workstation: [id number]   Family History Reviewed and non-contributory, no pertinent history of problems with bleeding or anesthesia Family History  Problem Relation Age of Onset   Cancer Mother    Stroke Father    Heart disease Father    Diabetes Mellitus II Sister       Review of Systems No fevers or chills no No numbness or tingling No chest pain No recent shortness of breath No bowel or bladder dysfunction No GI distress H/o prostate related post op urinary retention No headaches    OBJECTIVE  Vitals:Patient Vitals for the past 8 hrs:  BP Pulse Resp SpO2 Height Weight  03/09/24 2030 126/64 95 17 94 % -- --  03/09/24 2000 138/64 97 13 95 % -- --  03/09/24 1835 -- 99 (!) 23 94 % -- --  03/09/24 1830 (!) 140/76 95 -- 94 % -- --  03/09/24 1824 -- -- -- -- 5' (1.524 m) 69.9 kg   Physical Exam   Ortho Exam      Test Results that I reviewed  Imaging (my interpretation)  3 part stable min displ left hip intertr frx   DG Hip Unilat W or Wo Pelvis 2-3 Views Left Result Date: 03/09/2024 EXAM: 2 OR MORE VIEW(S) XRAY OF THE LEFT HIP 03/09/2024 07:08:44 PM COMPARISON: None available. CLINICAL HISTORY: fall FINDINGS: BONES AND JOINTS: Acute comminuted impacted left femoral intratrochanteric fracture. Mild displacement of lesser trochanteric fragment. SOFT TISSUES: Vascular calcifications. LUMBAR SPINE: Degenerative changes of the lower lumbar spine. IMPRESSION: 1. Acute comminuted impacted left femoral intertrochanteric fracture with mild  displacement of the lesser trochanteric fragment. 2. Degenerative changes of the lower lumbar spine. 3. Vascular calcifications. Electronically signed by: Greig Pique MD 03/09/2024 07:30 PM EST RP Workstation: [id number]  Labs cbc Recent Labs    03/09/24 1833  WBC 8.5  HGB 12.8*  HCT 38.5*  PLT 219    Labs inflam No results for input(s): CRP in the last 72 hours.  Invalid input(s): ESR  Labs coag No results for input(s): INR, PTT in the last 72 hours.  Invalid input(s): PT  Recent Labs    03/09/24 1833  NA 137  K 4.3  CL 104  CO2 20*  GLUCOSE 114*  BUN 22  CREATININE 0.97  CALCIUM  9.4        [1]  Allergies Allergen Reactions   Bupropion Hives

## 2024-03-09 NOTE — H&P (Signed)
 History and Physical    Patient: Eric Hinton DOB: February 10, 1941 DOA: 03/09/2024 DOS: the patient was seen and examined on 03/09/2024 PCP: [name], MD  Patient coming from: Home  Chief Complaint: Left hip pain Chief Complaint  Patient presents with   Fall   HPI: Eric Hinton is a 83 y.o. male with medical history significant of COPD, degenerative disc disease, hypertension, hyperlipidemia, history of prostate cancer, squamous cell carcinoma of the lungs, skin cancer, who has been otherwise well until today when he slipped and fell landing on his left hip subsequently sustained acute left hip pain and was unable to get out of the floor.  Patient's daughter found him laying on the floor and subsequently called EMS.  Patient admits to pain with intensity 9/10 localized to the left hip.  Denies any other acute complaints.  ED course: Upon arrival to the emergency room patient had temperature 98.1, respiratory rate 14, heart rate 99, blood pressure 140/76 saturating 94% on room air.  Follow-up x-ray showed acute comminuted and impacted left femoral intertrochanteric fracture with mild displacement of the lesser trochanteric fragment.  Orthopedic surgeon Dr. Margrette was contacted who recommended patient be admitted for surgical intervention tomorrow and made n.p.o. after midnight.  Hospitalist service was therefore contacted to admit patient for further management   Review of Systems: As mentioned in the history of present illness. All other systems reviewed and are negative. Past Medical History:  Diagnosis Date   Arthritis    Carotid artery disease    Status post bilateral CEA - Dr. Oris   Complication of anesthesia    unable to void afterwards   Constipation    COPD (chronic obstructive pulmonary disease) (HCC)    DDD (degenerative disc disease), cervical    Degenerative disc disease, lumbar    Essential hypertension    History of radiation therapy    Right  lung-06/25/23-07/02/23- Dr. Lynwood Nasuti   History of radiation therapy    Right Lung-10/21/23-11/04/23- Dr. Lynwood Nasuti   Hyperlipidemia    Macular degeneration    Prostate cancer [facility])    Skin cancer, basal cell    Squamous cell lung cancer (HCC) 07/15/2015   Status post chemotherapy and XRT - Dr. Sherrod   Tubular adenoma of colon    Past Surgical History:  Procedure Laterality Date   BRONCHIAL BIOPSY  12/11/2022   Procedure: BRONCHIAL BIOPSIES;  Surgeon: Shelah Lamar RAMAN, MD;  Location: [facility] ENDOSCOPY;  Service: Pulmonary;;   BRONCHIAL BRUSHINGS  12/11/2022   Procedure: BRONCHIAL BRUSHINGS;  Surgeon: Shelah Lamar RAMAN, MD;  Location: [facility] ENDOSCOPY;  Service: Pulmonary;;   BRONCHIAL NEEDLE ASPIRATION BIOPSY  12/11/2022   Procedure: BRONCHIAL NEEDLE ASPIRATION BIOPSIES;  Surgeon: Shelah Lamar RAMAN, MD;  Location: [facility] ENDOSCOPY;  Service: Pulmonary;;   BRONCHIAL WASHINGS  12/11/2022   Procedure: BRONCHIAL WASHINGS;  Surgeon: Shelah Lamar RAMAN, MD;  Location: [facility] ENDOSCOPY;  Service: Pulmonary;;   CATARACT EXTRACTION W/ INTRAOCULAR LENS  IMPLANT, BILATERAL Bilateral    COLONOSCOPY     COLONOSCOPY WITH PROPOFOL  N/A 05/17/2017   Procedure: COLONOSCOPY WITH PROPOFOL ;  Surgeon: Golda Claudis PENNER, MD;  Location: [facility] ENDO SUITE;  Service: Endoscopy;  Laterality: N/A;  10:15   ENDARTERECTOMY Right 08/29/2015   Procedure: RIGHT CAROTID ENDARTERECTOMY WITH PATCH ANGIOPLASTY;  Surgeon: Krystal JULIANNA Oris, MD;  Location: [facility] OR;  Service: Vascular;  Laterality: Right;   ENDARTERECTOMY Left 10/07/2015   Procedure: LEFT CAROTID ARTERY ENDARTERECTOMY;  Surgeon: Krystal JULIANNA Oris, MD;  Location: [facility] OR;  Service: Vascular;  Laterality: Left;   FIDUCIAL MARKER PLACEMENT  12/11/2022   Procedure: FIDUCIAL MARKER PLACEMENT;  Surgeon: Shelah Lamar RAMAN, MD;  Location: [facility] ENDOSCOPY;  Service: Pulmonary;;   FINE NEEDLE ASPIRATION  12/11/2022   Procedure: FINE NEEDLE ASPIRATION;  Surgeon: Shelah Lamar RAMAN, MD;  Location: [facility] ENDOSCOPY;  Service:  Pulmonary;;   NOSE SURGERY     rebulit my nose; related to skin cancer   PATCH ANGIOPLASTY Left 10/07/2015   Procedure: With HEMASHIELD PLATINUM PATCH ANGIOPLASTY;  Surgeon: Krystal JULIANNA Doing, MD;  Location: [facility] OR;  Service: Vascular;  Laterality: Left;   POLYPECTOMY  05/17/2017   Procedure: POLYPECTOMY;  Surgeon: Golda Claudis PENNER, MD;  Location: [facility] ENDO SUITE;  Service: Endoscopy;;  colon    PROSTATE BIOPSY     SKIN CANCER EXCISION Right    neck   TONSILLECTOMY     VIDEO BRONCHOSCOPY WITH ENDOBRONCHIAL ULTRASOUND N/A 12/11/2022   Procedure: VIDEO BRONCHOSCOPY WITH ENDOBRONCHIAL ULTRASOUND;  Surgeon: Shelah Lamar RAMAN, MD;  Location: [facility] ENDOSCOPY;  Service: Pulmonary;  Laterality: N/A;   Social History:  reports that he has been smoking cigarettes. He started smoking about 77 years ago. He has a 155.9 pack-year smoking history. He has never used smokeless tobacco. He reports that he does not currently use alcohol after a past usage of about 35.0 standard drinks of alcohol per week. He reports that he does not use drugs.  Allergies[1]  Family History  Problem Relation Age of Onset   Cancer Mother    Stroke Father    Heart disease Father    Diabetes Mellitus II Sister     Prior to Admission medications  Medication Sig Start Date End Date Taking? Authorizing Provider  albuterol  (VENTOLIN  HFA) 108 (90 Base) MCG/ACT inhaler Inhale 1 puff into the lungs every 6 (six) hours as needed. 11/06/22   [provider]  aspirin  325 MG tablet Take 1 tablet (325 mg total) by mouth daily. 05/18/17   Rehman, Claudis PENNER, MD  atorvastatin  (LIPITOR) 10 MG tablet Take 10 mg by mouth daily.    [provider]  bevacizumab  (AVASTIN ) 400 MG/16ML SOLN 1.25 mg every 8 (eight) weeks. Left eye 10/09/16   [provider]  Cyanocobalamin  (VITAMIN B-12) 2500 MCG SUBL Place 2,500 mcg under the tongue daily.    [provider]  FLUoxetine  (PROZAC ) 40 MG capsule Take 40 mg by mouth daily.  12/16/23   [provider]  latanoprost  (XALATAN ) 0.005 % ophthalmic solution Place 1 drop into both eyes at bedtime.    [provider]  menthol -zinc oxide (GOLD  BOND) powder Apply 1 application topically daily as needed (for itching).    [provider]  Multiple Vitamins-Minerals (PRESERVISION AREDS 2) CAPS Take 1 capsule by mouth 2 (two) times daily.    [provider]  naproxen sodium (ANAPROX) 220 MG tablet Take 440 mg by mouth 2 (two) times daily as needed (for pain).    [provider]  tamsulosin  (FLOMAX ) 0.4 MG CAPS capsule TAKE 1 CAPSULE BY MOUTH DAILY 11/05/15   Sheree Penne Bruckner, MD  telmisartan  (MICARDIS ) 40 MG tablet Take 40 mg by mouth daily. 02/19/23   [provider]    Physical Exam: Vitals:   03/09/24 1830 03/09/24 1835 03/09/24 2000 03/09/24 2030  BP: (!) 140/76  138/64 126/64  Pulse: 95 99 97 95  Resp:  (!) 23 13 17   SpO2: 94% 94% 95% 94%  Weight:  Height:       General - Elderly  male, no apparent distress HEENT - PERRLA, EOMI, atraumatic head, non tender sinuses. Lung - Clear, no rales, rhonchi, wheezes. Heart - S1, S2 heard, no murmurs, rubs, no pedal edema.  Abdomen - Soft, non tender, bowel sounds good Neuro - Alert, awake and oriented x 3, non focal exam. Skin - Warm and dry. Musculoskeletal: Patient noted to have tenderness to the left hip  Data Reviewed:  X-ray showed left femoral intertrochanteric fracture    Latest Ref Rng & Units 03/09/2024    6:33 PM 02/06/2024   12:53 PM 09/17/2023   12:47 PM  CBC  WBC 4.0 - 10.5 K/uL 8.5  5.6  5.5   Hemoglobin 13.0 - 17.0 g/dL 87.1  86.9  85.3   Hematocrit 39.0 - 52.0 % 38.5  37.9  43.9   Platelets 150 - 400 K/uL 219  197  219        Latest Ref Rng & Units 03/09/2024    6:33 PM 02/06/2024   12:53 PM 09/17/2023   12:47 PM  BMP  Glucose 70 - 99 mg/dL 885  87  890   BUN 8 - 23 mg/dL 22  18  20    Creatinine 0.61 - 1.24 mg/dL 9.02  8.87  8.98    Sodium 135 - 145 mmol/L 137  140  137   Potassium 3.5 - 5.1 mmol/L 4.3  4.3  4.3   Chloride 98 - 111 mmol/L 104  103  104   CO2 22 - 32 mmol/L 20  26  22    Calcium  8.9 - 10.3 mg/dL 9.4  9.4  9.3     Assessment and Plan: Acute comminuted left femoral intertrochanteric fracture X-ray of the left hip showed acute comminuted and impacted left femoral intertrochanteric fracture with mild displacement of the lesser trochanteric fragment. Orthopedic surgeon on board and planning surgical intervention tomorrow N.p.o. after midnight Continue as needed pain medication For PT OT consultation after surgical intervention tomorrow  Acute metabolic acidosis likely secondary to dehydration We will give supplemental IV fluid  COPD-not in acute exacerbation Continue nebulization as needed  Degenerative disc disease Continue as needed pain medication  Essential hypertension Continue irbesartan   Hyperlipidemia Continue atorvastatin   History of prostate cancer, squamous cell carcinoma of the lungs, skin cancer Continue outpatient surveillance  BPH-continue tamsulosin   DVT prophylaxis-placed on SCD given planned surgical intervention   Advance Care Planning:   Code Status: Prior full code  Consults: Orthopedics  Family Communication: Discussed with patient's son as well as granddaughter at bedside  Severity of Illness: The appropriate patient status for this patient is INPATIENT. Inpatient status is judged to be reasonable and necessary in order to provide the required intensity of service to ensure the patient's safety. The patient's presenting symptoms, physical exam findings, and initial radiographic and laboratory data in the context of their chronic comorbidities is felt to place them at high risk for further clinical deterioration. Furthermore, it is not anticipated that the patient will be medically stable for discharge from the hospital within 2 midnights of admission.   * I certify  that at the point of admission it is my clinical judgment that the patient will require inpatient hospital care spanning beyond 2 midnights from the point of admission due to high intensity of service, high risk for further deterioration and high frequency of surveillance required.*  Author: Drue ONEIDA Potter, MD 03/09/2024 9:01 PM  For on call review [email or web address].      [  1]  Allergies Allergen Reactions   Bupropion Hives

## 2024-03-09 NOTE — Progress Notes (Signed)
 Patient ID: Eric Hinton, male   DOB: Jul 23, 1940, 83 y.o.   MRN: [id number]   Call OR to find out what time case is scheduled for   Tentative plan for 03/10/24  Preoperative note  Diagnosis left intertroch hip fracture   Plan surgical treatment internal fixation  CBC     Latest Ref Rng & Units 03/09/2024    6:33 PM 02/06/2024   12:53 PM 09/17/2023   12:47 PM  CBC  WBC 4.0 - 10.5 K/uL 8.5  5.6  5.5   Hemoglobin 13.0 - 17.0 g/dL 87.1  86.9  85.3   Hematocrit 39.0 - 52.0 % 38.5  37.9  43.9   Platelets 150 - 400 K/uL 219  197  219      BASIC metabolic panel     Latest Ref Rng & Units 03/09/2024    6:33 PM 02/06/2024   12:53 PM 09/17/2023   12:47 PM  BMP  Glucose 70 - 99 mg/dL 885  87  890   BUN 8 - 23 mg/dL 22  18  20    Creatinine 0.61 - 1.24 mg/dL 9.02  8.87  8.98   Sodium 135 - 145 mmol/L 137  140  137   Potassium 3.5 - 5.1 mmol/L 4.3  4.3  4.3   Chloride 98 - 111 mmol/L 104  103  104   CO2 22 - 32 mmol/L 20  26  22    Calcium  8.9 - 10.3 mg/dL 9.4  9.4  9.3      PT/INR  BLOOD UNITS AVAILABLE type and screen  Chest x-ray  will order  DG Hip Unilat W or Wo Pelvis 2-3 Views Left Result Date: 03/09/2024 EXAM: 2 OR MORE VIEW(S) XRAY OF THE LEFT HIP 03/09/2024 07:08:44 PM COMPARISON: None available. CLINICAL HISTORY: fall FINDINGS: BONES AND JOINTS: Acute comminuted impacted left femoral intratrochanteric fracture. Mild displacement of lesser trochanteric fragment. SOFT TISSUES: Vascular calcifications. LUMBAR SPINE: Degenerative changes of the lower lumbar spine. IMPRESSION: 1. Acute comminuted impacted left femoral intertrochanteric fracture with mild displacement of the lesser trochanteric fragment. 2. Degenerative changes of the lower lumbar spine. 3. Vascular calcifications. Electronically signed by: Greig Pique MD 03/09/2024 07:30 PM EST RP Workstation: [id number]     EKG  pnd  Plain film findings  min displ left IT frx   OR NOTIFIED no

## 2024-03-09 NOTE — ED Provider Notes (Signed)
 [place] EMERGENCY DEPARTMENT AT [facility] Provider Note   CSN: [id number] Arrival date & time: 03/09/24  8176     Patient presents with: Eric Hinton is a 83 y.o. male.  {Add pertinent medical, surgical, social history, OB history to YEP:67052} Patient states that he was walking and got twisted in a rug and fell on his left hip.  He has pain in his left hip but no other areas.  He was on the floor for about an hour.  Patient has a history of COPD   Fall       Prior to Admission medications  Medication Sig Start Date End Date Taking? Authorizing Provider  albuterol  (VENTOLIN  HFA) 108 (90 Base) MCG/ACT inhaler Inhale 1 puff into the lungs every 6 (six) hours as needed. 11/06/22   [provider]  aspirin  325 MG tablet Take 1 tablet (325 mg total) by mouth daily. 05/18/17   Rehman, Claudis PENNER, MD  atorvastatin  (LIPITOR) 10 MG tablet Take 10 mg by mouth daily.    [provider]  bevacizumab  (AVASTIN ) 400 MG/16ML SOLN 1.25 mg every 8 (eight) weeks. Left eye 10/09/16   [provider]  Cyanocobalamin  (VITAMIN B-12) 2500 MCG SUBL Place 2,500 mcg under the tongue daily.    [provider]  FLUoxetine  (PROZAC ) 40 MG capsule Take 40 mg by mouth daily. 12/16/23   [provider]  latanoprost  (XALATAN ) 0.005 % ophthalmic solution Place 1 drop into both eyes at bedtime.    [provider]  menthol -zinc oxide (GOLD  BOND) powder Apply 1 application topically daily as needed (for itching).    [provider]  Multiple Vitamins-Minerals (PRESERVISION AREDS 2) CAPS Take 1 capsule by mouth 2 (two) times daily.    [provider]  naproxen sodium (ANAPROX) 220 MG tablet Take 440 mg by mouth 2 (two) times daily as needed (for pain).    [provider]  tamsulosin  (FLOMAX ) 0.4 MG CAPS capsule TAKE 1 CAPSULE BY MOUTH DAILY 11/05/15   Sheree Penne Bruckner, MD  telmisartan  (MICARDIS ) 40 MG tablet Take  40 mg by mouth daily. 02/19/23   [provider]    Allergies: Bupropion    Review of Systems  Updated Vital Signs BP 126/64   Pulse 95   Resp 17   Ht 5' (1.524 m)   Wt 69.9 kg   SpO2 94%   BMI 30.10 kg/m   Physical Exam  (all labs ordered are listed, but only abnormal results are displayed) Labs Reviewed  CBC WITH DIFFERENTIAL/PLATELET - Abnormal; Notable for the following components:      Result Value   RBC 4.10 (*)    Hemoglobin 12.8 (*)    HCT 38.5 (*)    Lymphs Abs 0.5 (*)    All other components within normal limits  COMPREHENSIVE METABOLIC PANEL WITH GFR - Abnormal; Notable for the following components:   CO2 20 (*)    Glucose, Bld 114 (*)    All other components within normal limits    EKG: None  Radiology: DG Hip Unilat W or Wo Pelvis 2-3 Views Left Result Date: 03/09/2024 EXAM: 2 OR MORE VIEW(S) XRAY OF THE LEFT HIP 03/09/2024 07:08:44 PM COMPARISON: None available. CLINICAL HISTORY: fall FINDINGS: BONES AND JOINTS: Acute comminuted impacted left femoral intratrochanteric fracture. Mild displacement of lesser trochanteric fragment. SOFT TISSUES: Vascular calcifications. LUMBAR SPINE: Degenerative changes of the lower lumbar spine. IMPRESSION: 1. Acute comminuted impacted left femoral intertrochanteric fracture with  mild displacement of the lesser trochanteric fragment. 2. Degenerative changes of the lower lumbar spine. 3. Vascular calcifications. Electronically signed by: Greig Pique MD 03/09/2024 07:30 PM EST RP Workstation: [id number]    {Document cardiac monitor, telemetry assessment procedure when appropriate:32947} Procedures   Medications Ordered in the ED - No data to display   Patient with a left hip fracture.  I spoke with orthopedics Dr. Margrette and he would like the patient n.p.o. after midnight.  He will try to fix his hip tomorrow {Click here for ABCD2, HEART and other calculators REFRESH Note before signing:1}                               Medical Decision Making Amount and/or Complexity of Data Reviewed Labs: ordered. Radiology: ordered.  Risk Decision regarding hospitalization.   Left hip fracture  {Document critical care time when appropriate  Document review of labs and clinical decision tools ie CHADS2VASC2, etc  Document your independent review of radiology images and any outside records  Document your discussion with family members, caretakers and with consultants  Document social determinants of health affecting pt's care  Document your decision making why or why not admission, treatments were needed:32947:::1}   Final diagnoses:  S/p left hip fracture    ED Discharge Orders     None

## 2024-03-09 NOTE — H&P (View-Only) (Signed)
 @LOGODEPT @   ORTHOPAEDIC CONSULTATION  REQUESTING PHYSICIAN: Dorinda Drue DASEN, MD  ASSESSMENT AND PLAN: 83 y.o. male w left hip frx. H/o copd, lung cancer s/p radiation, prostate cancer, post op ur retention, high risk for post op complications, guarded prognosis. Benefits of surgery outweigh risk   Rec: preop optimization by medical service and surgical fxation of the left hip with CMN (nail)   Chief Complaint: left hip pain   HPI Eric Hinton is a 83 y.o. male with  a left hip fracture.  He got up when he thought he heard someone at the door he caught his toe on something and fell and fractured his left hip he complains of severe left hip pain when he moves his hip.  Minimal pain at rest.  He has deformity of the left lower extremity with external rotation and shortening.  The pain is nonradiating  The fracture occurred on 09 March 2024  Past Medical History:  Diagnosis Date   Arthritis    Carotid artery disease    Status post bilateral CEA - Dr. Oris   Complication of anesthesia    unable to void afterwards   Constipation    COPD (chronic obstructive pulmonary disease) (HCC)    DDD (degenerative disc disease), cervical    Degenerative disc disease, lumbar    Essential hypertension    History of radiation therapy    Right lung-06/25/23-07/02/23- Dr. Lynwood Nasuti   History of radiation therapy    Right Lung-10/21/23-11/04/23- Dr. Lynwood Nasuti   Hyperlipidemia    Macular degeneration    Prostate cancer [facility])    Skin cancer, basal cell    Squamous cell lung cancer (HCC) 07/15/2015   Status post chemotherapy and XRT - Dr. Sherrod   Tubular adenoma of colon    Past Surgical History:  Procedure Laterality Date   BRONCHIAL BIOPSY  12/11/2022   Procedure: BRONCHIAL BIOPSIES;  Surgeon: Shelah Lamar RAMAN, MD;  Location: [facility] ENDOSCOPY;  Service: Pulmonary;;   BRONCHIAL BRUSHINGS  12/11/2022   Procedure: BRONCHIAL BRUSHINGS;  Surgeon: Shelah Lamar RAMAN, MD;  Location: [facility]  ENDOSCOPY;  Service: Pulmonary;;   BRONCHIAL NEEDLE ASPIRATION BIOPSY  12/11/2022   Procedure: BRONCHIAL NEEDLE ASPIRATION BIOPSIES;  Surgeon: Shelah Lamar RAMAN, MD;  Location: [facility] ENDOSCOPY;  Service: Pulmonary;;   BRONCHIAL WASHINGS  12/11/2022   Procedure: BRONCHIAL WASHINGS;  Surgeon: Shelah Lamar RAMAN, MD;  Location: [facility] ENDOSCOPY;  Service: Pulmonary;;   CATARACT EXTRACTION W/ INTRAOCULAR LENS  IMPLANT, BILATERAL Bilateral    COLONOSCOPY     COLONOSCOPY WITH PROPOFOL  N/A 05/17/2017   Procedure: COLONOSCOPY WITH PROPOFOL ;  Surgeon: Golda Claudis PENNER, MD;  Location: [facility] ENDO SUITE;  Service: Endoscopy;  Laterality: N/A;  10:15   ENDARTERECTOMY Right 08/29/2015   Procedure: RIGHT CAROTID ENDARTERECTOMY WITH PATCH ANGIOPLASTY;  Surgeon: Krystal JULIANNA Oris, MD;  Location: [facility] OR;  Service: Vascular;  Laterality: Right;   ENDARTERECTOMY Left 10/07/2015   Procedure: LEFT CAROTID ARTERY ENDARTERECTOMY;  Surgeon: Krystal JULIANNA Oris, MD;  Location: [facility] OR;  Service: Vascular;  Laterality: Left;   FIDUCIAL MARKER PLACEMENT  12/11/2022   Procedure: FIDUCIAL MARKER PLACEMENT;  Surgeon: Shelah Lamar RAMAN, MD;  Location: [facility] ENDOSCOPY;  Service: Pulmonary;;   FINE NEEDLE ASPIRATION  12/11/2022   Procedure: FINE NEEDLE ASPIRATION;  Surgeon: Shelah Lamar RAMAN, MD;  Location: [facility] ENDOSCOPY;  Service: Pulmonary;;   NOSE SURGERY     rebulit my nose; related to skin cancer   PATCH ANGIOPLASTY Left 10/07/2015   Procedure: With  HEMASHIELD PLATINUM PATCH ANGIOPLASTY;  Surgeon: Krystal JULIANNA Doing, MD;  Location: [facility] OR;  Service: Vascular;  Laterality: Left;   POLYPECTOMY  05/17/2017   Procedure: POLYPECTOMY;  Surgeon: Golda Claudis PENNER, MD;  Location: [facility] ENDO SUITE;  Service: Endoscopy;;  colon    PROSTATE BIOPSY     SKIN CANCER EXCISION Right    neck   TONSILLECTOMY     VIDEO BRONCHOSCOPY WITH ENDOBRONCHIAL ULTRASOUND N/A 12/11/2022   Procedure: VIDEO BRONCHOSCOPY WITH ENDOBRONCHIAL ULTRASOUND;  Surgeon: Shelah Lamar RAMAN, MD;  Location: [facility]  ENDOSCOPY;  Service: Pulmonary;  Laterality: N/A;   Social History   Socioeconomic History   Marital status: Married    Spouse name: Not on file   Number of children: Not on file   Years of education: Not on file   Highest education level: Not on file  Occupational History   Not on file  Tobacco Use   Smoking status: Every Day    Current packs/day: 2.00    Average packs/day: 2.0 packs/day for 77.9 years (155.9 ttl pk-yrs)    Types: Cigarettes    Start date: 04/03/1946   Smokeless tobacco: Never  Vaping Use   Vaping status: Never Used  Substance and Sexual Activity   Alcohol use: Not Currently    Alcohol/week: 35.0 standard drinks of alcohol    Types: 35 Cans of beer per week   Drug use: No   Sexual activity: Not on file  Other Topics Concern   Not on file  Social History Narrative   Not on file   Social Drivers of Health   Tobacco Use: High Risk (03/09/2024)   Patient History    Smoking Tobacco Use: Every Day    Smokeless Tobacco Use: Never    Passive Exposure: Not on file  Financial Resource Strain: Not on file  Food Insecurity: No Food Insecurity (06/10/2023)   Hunger Vital Sign    Worried About Running Out of Food in the Last Year: Never true    Ran Out of Food in the Last Year: Never true  Transportation Needs: No Transportation Needs (06/10/2023)   PRAPARE - Administrator, Civil Service (Medical): No    Lack of Transportation (Non-Medical): No  Physical Activity: Not on file  Stress: Not on file  Social Connections: Not on file  Depression (PHQ2-9): Low Risk (02/12/2024)   Depression (PHQ2-9)    PHQ-2 Score: 0  Alcohol Screen: Not on file  Housing: Low Risk (06/10/2023)   Housing Stability Vital Sign    Unable to Pay for Housing in the Last Year: No    Number of Times Moved in the Last Year: 0    Homeless in the Last Year: No  Utilities: Not At Risk (06/10/2023)   AHC Utilities    Threatened with loss of utilities: No  Health Literacy: Not on  file   Family History  Problem Relation Age of Onset   Cancer Mother    Stroke Father    Heart disease Father    Diabetes Mellitus II Sister    Allergies[1] Prior to Admission medications  Medication Sig Start Date End Date Taking? Authorizing Provider  albuterol  (VENTOLIN  HFA) 108 (90 Base) MCG/ACT inhaler Inhale 1 puff into the lungs every 6 (six) hours as needed. 11/06/22   [provider]  aspirin  325 MG tablet Take 1 tablet (325 mg total) by mouth daily. 05/18/17   Rehman, Claudis PENNER, MD  atorvastatin  (LIPITOR) 10 MG tablet Take 10 mg by  mouth daily.    [provider]  bevacizumab  (AVASTIN ) 400 MG/16ML SOLN 1.25 mg every 8 (eight) weeks. Left eye 10/09/16   [provider]  Cyanocobalamin  (VITAMIN B-12) 2500 MCG SUBL Place 2,500 mcg under the tongue daily.    [provider]  FLUoxetine  (PROZAC ) 40 MG capsule Take 40 mg by mouth daily. 12/16/23   [provider]  latanoprost  (XALATAN ) 0.005 % ophthalmic solution Place 1 drop into both eyes at bedtime.    [provider]  menthol -zinc oxide (GOLD  BOND) powder Apply 1 application topically daily as needed (for itching).    [provider]  Multiple Vitamins-Minerals (PRESERVISION AREDS 2) CAPS Take 1 capsule by mouth 2 (two) times daily.    [provider]  naproxen sodium (ANAPROX) 220 MG tablet Take 440 mg by mouth 2 (two) times daily as needed (for pain).    [provider]  tamsulosin  (FLOMAX ) 0.4 MG CAPS capsule TAKE 1 CAPSULE BY MOUTH DAILY 11/05/15   Sheree Penne Bruckner, MD  telmisartan  (MICARDIS ) 40 MG tablet Take 40 mg by mouth daily. 02/19/23   [provider]   DG Hip Unilat W or Wo Pelvis 2-3 Views Left Result Date: 03/09/2024 EXAM: 2 OR MORE VIEW(S) XRAY OF THE LEFT HIP 03/09/2024 07:08:44 PM COMPARISON: None available. CLINICAL HISTORY: fall FINDINGS: BONES AND JOINTS: Acute comminuted impacted left femoral intratrochanteric fracture.  Mild displacement of lesser trochanteric fragment. SOFT TISSUES: Vascular calcifications. LUMBAR SPINE: Degenerative changes of the lower lumbar spine. IMPRESSION: 1. Acute comminuted impacted left femoral intertrochanteric fracture with mild displacement of the lesser trochanteric fragment. 2. Degenerative changes of the lower lumbar spine. 3. Vascular calcifications. Electronically signed by: Greig Pique MD 03/09/2024 07:30 PM EST RP Workstation: [id number]   Family History Reviewed and non-contributory, no pertinent history of problems with bleeding or anesthesia Family History  Problem Relation Age of Onset   Cancer Mother    Stroke Father    Heart disease Father    Diabetes Mellitus II Sister       Review of Systems No fevers or chills no No numbness or tingling No chest pain No recent shortness of breath No bowel or bladder dysfunction No GI distress H/o prostate related post op urinary retention No headaches    OBJECTIVE  Vitals:Patient Vitals for the past 8 hrs:  BP Pulse Resp SpO2 Height Weight  03/09/24 2030 126/64 95 17 94 % -- --  03/09/24 2000 138/64 97 13 95 % -- --  03/09/24 1835 -- 99 (!) 23 94 % -- --  03/09/24 1830 (!) 140/76 95 -- 94 % -- --  03/09/24 1824 -- -- -- -- 5' (1.524 m) 69.9 kg   Physical Exam   Ortho Exam      Test Results that I reviewed  Imaging (my interpretation)  3 part stable min displ left hip intertr frx   DG Hip Unilat W or Wo Pelvis 2-3 Views Left Result Date: 03/09/2024 EXAM: 2 OR MORE VIEW(S) XRAY OF THE LEFT HIP 03/09/2024 07:08:44 PM COMPARISON: None available. CLINICAL HISTORY: fall FINDINGS: BONES AND JOINTS: Acute comminuted impacted left femoral intratrochanteric fracture. Mild displacement of lesser trochanteric fragment. SOFT TISSUES: Vascular calcifications. LUMBAR SPINE: Degenerative changes of the lower lumbar spine. IMPRESSION: 1. Acute comminuted impacted left femoral intertrochanteric fracture with mild  displacement of the lesser trochanteric fragment. 2. Degenerative changes of the lower lumbar spine. 3. Vascular calcifications. Electronically signed by: Greig Pique MD 03/09/2024 07:30 PM EST RP Workstation: [id number]  Labs cbc Recent Labs    03/09/24 1833  WBC 8.5  HGB 12.8*  HCT 38.5*  PLT 219    Labs inflam No results for input(s): CRP in the last 72 hours.  Invalid input(s): ESR  Labs coag No results for input(s): INR, PTT in the last 72 hours.  Invalid input(s): PT  Recent Labs    03/09/24 1833  NA 137  K 4.3  CL 104  CO2 20*  GLUCOSE 114*  BUN 22  CREATININE 0.97  CALCIUM  9.4        [1]  Allergies Allergen Reactions   Bupropion Hives

## 2024-03-09 NOTE — ED Triage Notes (Signed)
 Pt bib rcems for fall at home. Pt c/o left hip pain.

## 2024-03-10 ENCOUNTER — Encounter (HOSPITAL_COMMUNITY)
Admission: EM | Disposition: A | Discharge status: Skilled Nursing Facility | Payer: Self-pay | Source: Home / Self Care | Attending: Family Medicine

## 2024-03-10 ENCOUNTER — Inpatient Hospital Stay (HOSPITAL_COMMUNITY): Admitting: Certified Registered"

## 2024-03-10 ENCOUNTER — Inpatient Hospital Stay (HOSPITAL_COMMUNITY)

## 2024-03-10 ENCOUNTER — Encounter (HOSPITAL_COMMUNITY): Payer: Self-pay | Admitting: Internal Medicine

## 2024-03-10 DIAGNOSIS — N400 Enlarged prostate without lower urinary tract symptoms: Secondary | ICD-10-CM | POA: Diagnosis present

## 2024-03-10 DIAGNOSIS — S72142A Displaced intertrochanteric fracture of left femur, initial encounter for closed fracture: Secondary | ICD-10-CM | POA: Diagnosis not present

## 2024-03-10 DIAGNOSIS — S72002A Fracture of unspecified part of neck of left femur, initial encounter for closed fracture: Secondary | ICD-10-CM | POA: Diagnosis not present

## 2024-03-10 DIAGNOSIS — J449 Chronic obstructive pulmonary disease, unspecified: Secondary | ICD-10-CM | POA: Diagnosis present

## 2024-03-10 DIAGNOSIS — I10 Essential (primary) hypertension: Secondary | ICD-10-CM | POA: Diagnosis present

## 2024-03-10 HISTORY — PX: INTRAMEDULLARY (IM) NAIL INTERTROCHANTERIC: SHX5875

## 2024-03-10 LAB — CBC
HCT: 34.2 % — ABNORMAL LOW (ref 39.0–52.0)
HCT: 30.5 % — ABNORMAL LOW (ref 39.0–52.0)
Hemoglobin: 11.2 g/dL — ABNORMAL LOW (ref 13.0–17.0)
Hemoglobin: 10.3 g/dL — ABNORMAL LOW (ref 13.0–17.0)
MCH: 30.9 pg (ref 26.0–34.0)
MCH: 32.1 pg (ref 26.0–34.0)
MCHC: 32.7 g/dL (ref 30.0–36.0)
MCHC: 33.8 g/dL (ref 30.0–36.0)
MCV: 94.5 fL (ref 80.0–100.0)
MCV: 95 fL (ref 80.0–100.0)
Platelets: 191 K/uL (ref 150–400)
Platelets: 185 K/uL (ref 150–400)
RBC: 3.62 MIL/uL — ABNORMAL LOW (ref 4.22–5.81)
RBC: 3.21 MIL/uL — ABNORMAL LOW (ref 4.22–5.81)
RDW: 13.6 % (ref 11.5–15.5)
RDW: 14 % (ref 11.5–15.5)
WBC: 6.7 K/uL (ref 4.0–10.5)
WBC: 9.5 K/uL (ref 4.0–10.5)
nRBC: 0 % (ref 0.0–0.2)
nRBC: 0 % (ref 0.0–0.2)

## 2024-03-10 LAB — BASIC METABOLIC PANEL WITH GFR
Anion gap: 10 (ref 5–15)
BUN: 17 mg/dL (ref 8–23)
CO2: 25 mmol/L (ref 22–32)
Calcium: 9 mg/dL (ref 8.9–10.3)
Chloride: 103 mmol/L (ref 98–111)
Creatinine, Ser: 0.91 mg/dL (ref 0.61–1.24)
GFR, Estimated: >60 mL/min (ref >60)
Glucose, Bld: 94 mg/dL (ref 70–99)
Potassium: 4.2 mmol/L (ref 3.5–5.1)
Sodium: 138 mmol/L (ref 135–145)

## 2024-03-10 LAB — CREATININE, SERUM
Creatinine, Ser: 0.77 mg/dL (ref 0.61–1.24)
GFR, Estimated: >60 mL/min (ref >60)

## 2024-03-10 SURGERY — FIXATION, FRACTURE, INTERTROCHANTERIC, WITH INTRAMEDULLARY ROD
Anesthesia: General | Site: Hip | Laterality: Left

## 2024-03-10 MED ORDER — ONDANSETRON HCL 4 MG/2ML IJ SOLN: 4.0000 mg | Freq: Four times a day (QID) | INTRAMUSCULAR | Status: DC | PRN

## 2024-03-10 MED ORDER — FENTANYL CITRATE (PF) 100 MCG/2ML IJ SOLN
INTRAMUSCULAR | Status: AC
  Filled 2024-03-10: qty 2

## 2024-03-10 MED ORDER — PHENYLEPHRINE 80 MCG/ML (10ML) SYRINGE FOR IV PUSH (FOR BLOOD PRESSURE SUPPORT)
PREFILLED_SYRINGE | INTRAVENOUS | Status: DC | PRN
  Administered 2024-03-10 (×2): 160 ug via INTRAVENOUS

## 2024-03-10 MED ORDER — METOPROLOL TARTRATE 5 MG/5ML IV SOLN
INTRAVENOUS | Status: AC
  Filled 2024-03-10: qty 5

## 2024-03-10 MED ORDER — METHOCARBAMOL 500 MG PO TABS
500.0000 mg | ORAL_TABLET | Freq: Three times a day (TID) | ORAL | Status: DC
  Administered 2024-03-10 – 2024-03-13 (×9): 500 mg via ORAL
  Filled 2024-03-10 (×10): qty 1

## 2024-03-10 MED ORDER — HYDRALAZINE HCL 20 MG/ML IJ SOLN: 10.0000 mg | Freq: Four times a day (QID) | INTRAMUSCULAR | Status: DC | PRN

## 2024-03-10 MED ORDER — OXYCODONE HCL 5 MG PO TABS: 5.0000 mg | ORAL_TABLET | Freq: Four times a day (QID) | ORAL | Status: DC | PRN

## 2024-03-10 MED ORDER — POLYETHYLENE GLYCOL 3350 17 G PO PACK
17.0000 g | PACK | Freq: Every day | ORAL | Status: DC
  Administered 2024-03-10 – 2024-03-12 (×3): 17 g via ORAL
  Filled 2024-03-10 (×6): qty 1

## 2024-03-10 MED ORDER — LIDOCAINE 2% (20 MG/ML) 5 ML SYRINGE
INTRAMUSCULAR | Status: AC
  Filled 2024-03-10: qty 5

## 2024-03-10 MED ORDER — LIDOCAINE HCL (CARDIAC) PF 100 MG/5ML IV SOSY
PREFILLED_SYRINGE | INTRAVENOUS | Status: DC | PRN
  Administered 2024-03-10: 15:00:00 50 mg via INTRAVENOUS

## 2024-03-10 MED ORDER — BUPIVACAINE-EPINEPHRINE (PF) 0.5% -1:200000 IJ SOLN
INTRAMUSCULAR | Status: DC | PRN
  Administered 2024-03-10: 15:00:00 30 mL via PERINEURAL

## 2024-03-10 MED ORDER — DOCUSATE SODIUM 100 MG PO CAPS
100.0000 mg | ORAL_CAPSULE | Freq: Two times a day (BID) | ORAL | Status: DC
  Administered 2024-03-10 – 2024-03-12 (×5): 100 mg via ORAL
  Filled 2024-03-10 (×9): qty 1

## 2024-03-10 MED ORDER — TRANEXAMIC ACID-NACL 1000-0.7 MG/100ML-% IV SOLN
1000.0000 mg | INTRAVENOUS | Status: AC
  Administered 2024-03-10: 14:00:00 1000 mg via INTRAVENOUS
  Filled 2024-03-10: qty 100

## 2024-03-10 MED ORDER — METOCLOPRAMIDE HCL 5 MG PO TABS: 5.0000 mg | ORAL_TABLET | Freq: Three times a day (TID) | ORAL | Status: DC | PRN

## 2024-03-10 MED ORDER — RIVAROXABAN 10 MG PO TABS
10.0000 mg | ORAL_TABLET | Freq: Every day | ORAL | Status: DC
  Administered 2024-03-11: 09:00:00 10 mg via ORAL
  Filled 2024-03-10 (×2): qty 1

## 2024-03-10 MED ORDER — POVIDONE-IODINE 10 % EX SWAB
2.0000 | Freq: Once | CUTANEOUS | Status: AC
  Administered 2024-03-10: 13:00:00 2 via TOPICAL

## 2024-03-10 MED ORDER — CHLORHEXIDINE GLUCONATE 0.12 % MT SOLN
OROMUCOSAL | Status: AC
  Administered 2024-03-10: 13:00:00 15 mL via OROMUCOSAL
  Filled 2024-03-10: qty 15

## 2024-03-10 MED ORDER — CEFAZOLIN SODIUM-DEXTROSE 2-4 GM/100ML-% IV SOLN
2.0000 g | Freq: Four times a day (QID) | INTRAVENOUS | Status: AC
  Administered 2024-03-10 – 2024-03-11 (×2): 2 g via INTRAVENOUS
  Filled 2024-03-10 (×2): qty 100

## 2024-03-10 MED ORDER — RIVAROXABAN 10 MG PO TABS: 10.0000 mg | ORAL_TABLET | Freq: Every day | ORAL | Status: DC

## 2024-03-10 MED ORDER — DEXAMETHASONE SOD PHOSPHATE PF 10 MG/ML IJ SOLN
INTRAMUSCULAR | Status: DC | PRN
  Administered 2024-03-10: 14:00:00 5 mg via INTRAVENOUS

## 2024-03-10 MED ORDER — METOCLOPRAMIDE HCL 5 MG/ML IJ SOLN: 5.0000 mg | Freq: Three times a day (TID) | INTRAMUSCULAR | Status: DC | PRN

## 2024-03-10 MED ORDER — CHLORHEXIDINE GLUCONATE 0.12 % MT SOLN: 15.0000 mL | Freq: Once | OROMUCOSAL | Status: AC

## 2024-03-10 MED ORDER — SODIUM CHLORIDE 0.9 % IV SOLN: INTRAVENOUS | Status: AC

## 2024-03-10 MED ORDER — ACETAMINOPHEN 500 MG PO TABS
1000.0000 mg | ORAL_TABLET | Freq: Four times a day (QID) | ORAL | Status: DC
  Administered 2024-03-10 – 2024-03-13 (×9): 1000 mg via ORAL
  Filled 2024-03-10 (×10): qty 2

## 2024-03-10 MED ORDER — ONDANSETRON HCL 4 MG/2ML IJ SOLN
INTRAMUSCULAR | Status: DC | PRN
  Administered 2024-03-10: 15:00:00 4 mg via INTRAVENOUS

## 2024-03-10 MED ORDER — METOPROLOL TARTRATE 5 MG/5ML IV SOLN
INTRAVENOUS | Status: DC | PRN
  Administered 2024-03-10 (×2): 1 mg via INTRAVENOUS

## 2024-03-10 MED ORDER — LACTATED RINGERS IV SOLN: INTRAVENOUS | Status: DC

## 2024-03-10 MED ORDER — PROPOFOL 10 MG/ML IV BOLUS
INTRAVENOUS | Status: DC | PRN
  Administered 2024-03-10: 14:00:00 150 mg via INTRAVENOUS
  Administered 2024-03-10: 15:00:00 50 mg via INTRAVENOUS

## 2024-03-10 MED ORDER — FENTANYL CITRATE (PF) 100 MCG/2ML IJ SOLN
INTRAMUSCULAR | Status: DC | PRN
  Administered 2024-03-10: 14:00:00 100 ug via INTRAVENOUS
  Administered 2024-03-10 (×2): 50 ug via INTRAVENOUS

## 2024-03-10 MED ORDER — PHENYLEPHRINE 80 MCG/ML (10ML) SYRINGE FOR IV PUSH (FOR BLOOD PRESSURE SUPPORT)
PREFILLED_SYRINGE | INTRAVENOUS | Status: AC
  Filled 2024-03-10: qty 10

## 2024-03-10 MED ORDER — MENTHOL 3 MG MT LOZG: 1.0000 | LOZENGE | OROMUCOSAL | Status: DC | PRN

## 2024-03-10 MED ORDER — CHLORHEXIDINE GLUCONATE 4 % EX SOLN
60.0000 mL | Freq: Once | CUTANEOUS | Status: AC
  Administered 2024-03-10: 13:00:00 4 via TOPICAL
  Filled 2024-03-10: qty 15

## 2024-03-10 MED ORDER — SODIUM CHLORIDE 0.9 % IV SOLN: INTRAVENOUS | Status: AC | PRN

## 2024-03-10 MED ORDER — ONDANSETRON HCL 4 MG PO TABS: 4.0000 mg | ORAL_TABLET | Freq: Four times a day (QID) | ORAL | Status: DC | PRN

## 2024-03-10 MED ORDER — ENOXAPARIN SODIUM 40 MG/0.4ML IJ SOSY: 40.0000 mg | PREFILLED_SYRINGE | INTRAMUSCULAR | Status: DC

## 2024-03-10 MED ORDER — CEFAZOLIN SODIUM-DEXTROSE 2-4 GM/100ML-% IV SOLN
2.0000 g | INTRAVENOUS | Status: AC
  Administered 2024-03-10: 14:00:00 2 g via INTRAVENOUS
  Filled 2024-03-10: qty 100

## 2024-03-10 MED ORDER — BUPIVACAINE-EPINEPHRINE (PF) 0.5% -1:200000 IJ SOLN
INTRAMUSCULAR | Status: AC
  Filled 2024-03-10: qty 30

## 2024-03-10 MED ORDER — METHOCARBAMOL 1000 MG/10ML IJ SOLN: 500.0000 mg | Freq: Four times a day (QID) | INTRAMUSCULAR | Status: DC | PRN

## 2024-03-10 MED ORDER — HEMOSTATIC AGENTS (NO CHARGE) OPTIME
TOPICAL | Status: DC | PRN
  Administered 2024-03-10: 15:00:00 1 via TOPICAL

## 2024-03-10 MED ORDER — MORPHINE SULFATE (PF) 2 MG/ML IV SOLN: 2.0000 mg | INTRAVENOUS | Status: DC | PRN

## 2024-03-10 MED ORDER — SODIUM CHLORIDE 0.9 % IR SOLN
Status: DC | PRN
  Administered 2024-03-10: 15:00:00 1000 mL

## 2024-03-10 MED ORDER — TRAMADOL HCL 50 MG PO TABS
50.0000 mg | ORAL_TABLET | Freq: Four times a day (QID) | ORAL | Status: DC
  Administered 2024-03-10 – 2024-03-13 (×11): 50 mg via ORAL
  Filled 2024-03-10 (×11): qty 1

## 2024-03-10 MED ORDER — PHENOL 1.4 % MT LIQD: 1.0000 | OROMUCOSAL | Status: DC | PRN

## 2024-03-10 MED ORDER — SODIUM CHLORIDE 0.9 % IV SOLN: INTRAVENOUS | Status: DC

## 2024-03-10 MED ORDER — TRANEXAMIC ACID-NACL 1000-0.7 MG/100ML-% IV SOLN
1000.0000 mg | Freq: Once | INTRAVENOUS | Status: AC
  Administered 2024-03-10: 21:00:00 1000 mg via INTRAVENOUS
  Filled 2024-03-10: qty 100

## 2024-03-10 MED ORDER — ONDANSETRON HCL 4 MG/2ML IJ SOLN
INTRAMUSCULAR | Status: AC
  Filled 2024-03-10: qty 2

## 2024-03-10 MED ORDER — METHOCARBAMOL 500 MG PO TABS
500.0000 mg | ORAL_TABLET | Freq: Four times a day (QID) | ORAL | Status: DC | PRN
  Administered 2024-03-10: 18:00:00 500 mg via ORAL

## 2024-03-10 SURGICAL SUPPLY — 53 items
BENZOIN TINCTURE PRP APPL 2/3 (GAUZE/BANDAGES/DRESSINGS) IMPLANT
BIT DRILL AO GAMMA 4.2X300 (BIT) ×1 IMPLANT
BLADE SURG SZ10 CARB STEEL (BLADE) ×2 IMPLANT
BNDG GAUZE DERMACEA FLUFF 4 (GAUZE/BANDAGES/DRESSINGS) ×1 IMPLANT
CATH FOLEY 2WAY SLVR 5CC 14FR (CATHETERS) IMPLANT
CHLORAPREP W/TINT 26 (MISCELLANEOUS) ×1 IMPLANT
CLOTH BEACON ORANGE TIMEOUT ST (SAFETY) ×1 IMPLANT
COUNTER NDL MAGNETIC 40 RED (SET/KITS/TRAYS/PACK) ×1 IMPLANT
COUNTER NEEDLE MAGNETIC 40 RED (SET/KITS/TRAYS/PACK) ×1 IMPLANT
COVER LIGHT HANDLE STERIS (MISCELLANEOUS) ×2 IMPLANT
COVER MAYO STAND XLG (MISCELLANEOUS) ×1 IMPLANT
COVER PERINEAL POST (MISCELLANEOUS) ×1 IMPLANT
DRAPE STERI IOBAN 125X83 (DRAPES) ×1 IMPLANT
DRESSING AQUACEL AG ADV 3.5X12 (MISCELLANEOUS) ×1 IMPLANT
DRSG AQUACEL AG ADV 3.5X10 (GAUZE/BANDAGES/DRESSINGS) IMPLANT
DRSG MEPILEX SACRM 8.7X9.8 (GAUZE/BANDAGES/DRESSINGS) ×1 IMPLANT
ELECTRODE REM PT RTRN 9FT ADLT (ELECTROSURGICAL) ×1 IMPLANT
GLOVE BIOGEL PI IND STRL 7.0 (GLOVE) ×2 IMPLANT
GLOVE BIOGEL PI IND STRL 8.5 (GLOVE) ×1 IMPLANT
GLOVE SKINSENSE STRL SZ8.0 LF (GLOVE) ×1 IMPLANT
GOWN STRL REUS W/TWL LRG LVL3 (GOWN DISPOSABLE) ×1 IMPLANT
GOWN STRL REUS W/TWL XL LVL3 (GOWN DISPOSABLE) ×1 IMPLANT
GUIDEROD T2 3X1000 (ROD) ×1 IMPLANT
INST SET MAJOR BONE (KITS) ×1 IMPLANT
KIT TURNOVER CYSTO (KITS) ×1 IMPLANT
KWIRE 3.2X450M STR (WIRE) ×1 IMPLANT
MANIFOLD NEPTUNE II (INSTRUMENTS) ×1 IMPLANT
MARKER SKIN DUAL TIP RULER LAB (MISCELLANEOUS) ×1 IMPLANT
NAIL TROCH GAMMA 11X18 (Nail) IMPLANT
NDL HYPO 21X1.5 SAFETY (NEEDLE) ×1 IMPLANT
NDL SPNL 18GX3.5 QUINCKE PK (NEEDLE) ×1 IMPLANT
NEEDLE HYPO 21X1.5 SAFETY (NEEDLE) ×1 IMPLANT
NEEDLE SPNL 18GX3.5 QUINCKE PK (NEEDLE) ×1 IMPLANT
PACK SRG BSC III STRL LF ECLPS (CUSTOM PROCEDURE TRAY) ×1 IMPLANT
PAD ABD 5X9 TENDERSORB (GAUZE/BANDAGES/DRESSINGS) ×1 IMPLANT
PAD ARMBOARD POSITIONER FOAM (MISCELLANEOUS) ×1 IMPLANT
PENCIL SMOKE EVACUATOR COATED (MISCELLANEOUS) ×1 IMPLANT
POSITIONER HEAD 8X9X4 ADT (SOFTGOODS) ×1 IMPLANT
POWDER SURGICEL 3.0 GRAM (HEMOSTASIS) IMPLANT
SCREW LAG GAMMA 3 TI 10.5X105M (Screw) IMPLANT
SCREW LOCKING T2 F/T 5MMX35MM (Screw) IMPLANT
SET BASIN LINEN APH (SET/KITS/TRAYS/PACK) ×1 IMPLANT
SOLN 0.9% NACL POUR BTL 1000ML (IV SOLUTION) ×1 IMPLANT
SPIKE FLUID TRANSFER (MISCELLANEOUS) ×2 IMPLANT
SPONGE T-LAP 18X18 ~~LOC~~+RFID (SPONGE) ×2 IMPLANT
STRIP CLOSURE SKIN 1/2X4 (GAUZE/BANDAGES/DRESSINGS) IMPLANT
SUT BRALON NAB BRD #1 30IN (SUTURE) ×1 IMPLANT
SUT MNCRL 0 VIOLET CTX 36 (SUTURE) ×1 IMPLANT
SUT MON AB 2-0 CT1 36 (SUTURE) ×1 IMPLANT
SYR 30ML LL (SYRINGE) ×1 IMPLANT
SYR BULB IRRIG 60ML STRL (SYRINGE) ×2 IMPLANT
TRAY FOLEY MTR SLVR 16FR STAT (SET/KITS/TRAYS/PACK) ×1 IMPLANT
YANKAUER SUCT BULB TIP NO VENT (SUCTIONS) ×1 IMPLANT

## 2024-03-10 NOTE — Plan of Care (Signed)

## 2024-03-10 NOTE — Progress Notes (Addendum)
 Pt scheduled for surgery today. TOC will follow up after PT evaluation to discuss d/c planning.    03/10/24 0744  TOC Brief Assessment  Insurance and Status Reviewed  Patient has primary care physician Yes  Home environment has been reviewed Lives alone.  Prior level of function: Independent.  Prior/Current Home Services No current home services  Social Drivers of Health Review SDOH reviewed no interventions necessary  Readmission risk has been reviewed Yes  Transition of care needs no transition of care needs at this time

## 2024-03-10 NOTE — Transfer of Care (Addendum)
 Immediate Anesthesia Transfer of Care Note  Patient: Eric Hinton  Procedure(s) Performed: FIXATION, FRACTURE, INTERTROCHANTERIC, WITH INTRAMEDULLARY ROD (Left: Hip)  Patient Location: PACU  Anesthesia Type:General  Level of Consciousness: drowsy, patient cooperative, and responds to stimulation  Airway & Oxygen Therapy: Patient Spontanous Breathing and Patient connected to nasal cannula oxygen  Post-op Assessment: Report given to RN and Post -op Vital signs reviewed and stable  Post vital signs: Reviewed and stable  Last Vitals:  Vitals Value Taken Time  BP 138/58 15:43  Temp 97.1   Pulse 93   Resp 16   SpO2 97     Last Pain:  Vitals:   03/10/24 1253  TempSrc:   PainSc: 5       Patients Stated Pain Goal: 4 (03/10/24 1253)  Complications: No notable events documented.

## 2024-03-10 NOTE — Progress Notes (Addendum)
 PROGRESS NOTE  Eric Hinton, is a 83 y.o. male, DOB - 1940-12-02, FMW:[id number]  Admit date - 03/09/2024   Admitting Physician Drue ONEIDA Potter, MD  Outpatient Primary MD for the patient is [name], MD  LOS - 1  Chief Complaint  Patient presents with   Fall      Brief Narrative:  83 y.o. male with medical history significant of COPD, degenerative disc disease, hypertension, hyperlipidemia, history of prostate cancer, squamous cell carcinoma of the lungs, skin cancer who is legally blind admitted on 03/09/2024 with left hip fracture after mechanical fall at home  -Assessment and Plan: 1)Lt Hip Fx--s/p mechanical fall at home with left hip fracture - Awaiting ORIF by orthopedic team on 03/09/2024 - Continue as needed oxycodone  and IV morphine  as needed - Further management per orthopedic team = PT OT eval postoperatively - Xarelto  10 mg daily for DVT prophylaxis while in the hospital starting p.m. of 03/11/2024  2)Mild Acute Anemia--suspect related to #1 above - Monitor closely postoperatively especially while on DVT prophylaxis  3)HTN--continue Avapro  - IV hydralazine  as needed elevated BP  4)HLD-atorvastatin  due to fall and risk for muscle injury/rhabdo  5)COPD--stable, no acute exacerbation--May use as needed bronchodilators  6)History of prostate CA/BPH--- continue Flomax   7)Depression--- continue Prozac   Status is: Inpatient   Disposition: The patient is from: Home              Anticipated d/c is to: SNF              Anticipated d/c date is: 1 day              Patient currently is not medically stable to d/c. Barriers: Not Clinically Stable-   Code Status :  -  Code Status: Full Code   Family Communication:   NA (patient is alert, awake and coherent)   Plan of care discussed with patient's son Jamason at bedside  DVT Prophylaxis  :   - SCDs  rivaroxaban  (XARELTO ) tablet 10 mg Start: 03/11/24 1700 SCDs Start: 03/09/24 2103 rivaroxaban  (XARELTO ) tablet 10 mg    Lab Results  Component Value Date   PLT 191 03/10/2024    Inpatient Medications  Scheduled Meds:  chlorhexidine   60 mL Topical Once   FLUoxetine   40 mg Oral Daily   [START ON 03/11/2024] irbesartan   75 mg Oral Daily   methocarbamol   500 mg Oral TID   povidone-iodine   2 Application Topical Once   [START ON 03/11/2024] rivaroxaban   10 mg Oral Q supper   tamsulosin   0.4 mg Oral QPM   Continuous Infusions:  sodium chloride  150 mL/hr at 03/10/24 9078   sodium chloride  75 mL/hr at 03/10/24 0208    ceFAZolin  (ANCEF ) IV     tranexamic acid      PRN Meds:.acetaminophen , hydrALAZINE , ipratropium-albuterol , morphine  injection, oxyCODONE    Anti-infectives (From admission, onward)    Start     Dose/Rate Route Frequency Ordered Stop   03/10/24 0845  ceFAZolin  (ANCEF ) IVPB 2g/100 mL premix        2 g 200 mL/hr over 30 Minutes Intravenous On call to O.R. 03/10/24 0745 03/11/24 0559         Subjective: Eric Hinton today has no fevers, no emesis,  No chest pain,   - Resting comfortably, hip pain improving with as needed opiates - Plan of care discussed with patient's son Khoury at bedside   Objective: Vitals:   03/09/24 2130 03/09/24 2200 03/09/24 2301 03/10/24 0403  BP: 132/62  136/67 127/64 118/61  Pulse: 92 95 95 99  Resp: 10 15 20    Temp:   (!) 97.4 F (36.3 C) 97.8 F (36.6 C)  TempSrc:   Oral Oral  SpO2: 96% 94% 95% 96%  Weight:   68.5 kg   Height:   5' 4 (1.626 m)     Intake/Output Summary (Last 24 hours) at 03/10/2024 1236 Last data filed at 03/10/2024 0300 Gross per 24 hour  Intake 64.32 ml  Output 200 ml  Net -135.68 ml   Filed Weights   03/09/24 1824 03/09/24 2301  Weight: 69.9 kg 68.5 kg   Physical Exam Gen:- Awake Alert, in no acute distress HEENT:- [place].AT, No sclera icterus, patient is legally blind Neck-Supple Neck,No JVD,.  Lungs-  CTAB , fair symmetrical air movement CV- S1, S2 normal, regular  Abd-  +ve B.Sounds, Abd Soft, No  tenderness,    Extremity/Skin:- No  edema, pedal pulses present  Psych-affect is appropriate, oriented x3 Neuro-no new focal deficits, no tremors MSK--left hip area point tenderness, left lower extremity shortened and rotated  Data Reviewed: I have personally reviewed following labs and imaging studies  CBC: Recent Labs  Lab 03/09/24 1833 03/10/24 0515  WBC 8.5 6.7  NEUTROABS 7.5  --   HGB 12.8* 11.2*  HCT 38.5* 34.2*  MCV 93.9 94.5  PLT 219 191   Basic Metabolic Panel: Recent Labs  Lab 03/09/24 1833 03/10/24 0515  NA 137 138  K 4.3 4.2  CL 104 103  CO2 20* 25  GLUCOSE 114* 94  BUN 22 17  CREATININE 0.97 0.91  CALCIUM  9.4 9.0   GFR: Estimated Creatinine Clearance: 51.5 mL/min (by C-G formula based on SCr of 0.91 mg/dL). Liver Function Tests: Recent Labs  Lab 03/09/24 1833  AST 18  ALT 19  ALKPHOS 123  BILITOT 0.4  PROT 6.7  ALBUMIN  4.1   Radiology Studies: DG Chest Port 1 View Result Date: 03/09/2024 EXAM: 1 VIEW(S) XRAY OF THE CHEST 03/09/2024 09:45:00 PM COMPARISON: 12/11/2022 CLINICAL HISTORY: COPD (chronic obstructive pulmonary disease) (HCC) FINDINGS: LUNGS AND PLEURA: Persistent right upper lung nodular opacities with adjacent fiducial markers. Persistent left upper lung fiducial markers. Chronic coarsened interstitial markings without pulmonary edema. No pleural effusion. No pneumothorax. HEART AND MEDIASTINUM: Atherosclerotic plaque. No acute abnormality of the cardiac and mediastinal silhouettes. BONES AND SOFT TISSUES: Left neck surgical clips. No acute osseous abnormality. IMPRESSION: 1. Persistent upper lobe nodular opacities bilaterally with fiducial markings 2. Chronic coarsened interstitial markings without pulmonary edema. Electronically signed by: Oneil Devonshire MD 03/09/2024 10:01 PM EST RP Workstation: [id number]   DG Hip Unilat W or Wo Pelvis 2-3 Views Left Result Date: 03/09/2024 EXAM: 2 OR MORE VIEW(S) XRAY OF THE LEFT HIP 03/09/2024 07:08:44  PM COMPARISON: None available. CLINICAL HISTORY: fall FINDINGS: BONES AND JOINTS: Acute comminuted impacted left femoral intratrochanteric fracture. Mild displacement of lesser trochanteric fragment. SOFT TISSUES: Vascular calcifications. LUMBAR SPINE: Degenerative changes of the lower lumbar spine. IMPRESSION: 1. Acute comminuted impacted left femoral intertrochanteric fracture with mild displacement of the lesser trochanteric fragment. 2. Degenerative changes of the lower lumbar spine. 3. Vascular calcifications. Electronically signed by: Greig Pique MD 03/09/2024 07:30 PM EST RP Workstation: [id number]   Scheduled Meds:  chlorhexidine   60 mL Topical Once   FLUoxetine   40 mg Oral Daily   [START ON 03/11/2024] irbesartan   75 mg Oral Daily   methocarbamol   500 mg Oral TID   povidone-iodine   2 Application Topical Once   [START ON  03/11/2024] rivaroxaban   10 mg Oral Q supper   tamsulosin   0.4 mg Oral QPM   Continuous Infusions:  sodium chloride  150 mL/hr at 03/10/24 9078   sodium chloride  75 mL/hr at 03/10/24 0208    ceFAZolin  (ANCEF ) IV     tranexamic acid       LOS: 1 day   Rendall Carwin M.D on 03/10/2024 at 12:36 PM  Go to [email or web address] - for contact info  [facility] - Office  [phone]  If 7PM-7AM, please contact night-coverage [email or web address] 03/10/2024, 12:36 PM

## 2024-03-10 NOTE — Anesthesia Procedure Notes (Signed)
 Procedure Name: LMA Insertion Date/Time: 03/10/2024 2:05 PM  Performed by: Cordella Elvie HERO, CRNAPre-anesthesia Checklist: Patient identified, Emergency Drugs available, Suction available, Patient being monitored and Timeout performed Patient Re-evaluated:Patient Re-evaluated prior to induction Oxygen Delivery Method: Circle system utilized Preoxygenation: Pre-oxygenation with 100% oxygen Induction Type: IV induction LMA: LMA inserted LMA Size: 4.0 Number of attempts: 1 Placement Confirmation: positive ETCO2, CO2 detector and breath sounds checked- equal and bilateral Tube secured with: Tape Dental Injury: Teeth and Oropharynx as per pre-operative assessment

## 2024-03-10 NOTE — Op Note (Signed)
 Open treatment internal fixation of the hip with CEPHALOMEDULLARY nail  Orthopaedic Surgery Operative Note (CSN: [id number])  Lynwood SQUIBB Oh  [birth year] Date of Surgery: 03/10/2024   Diagnoses:  left hip intertrochanteric hip fracture  Procedure: INTERNAL FIXATION LEFT HIP    TXA [ used]   Operative Finding 3 PART STABLE INTERTROCHANTERIC HIP FRACTURE    Post-Op Diagnosis: Same Surgeons:Primary: Margrette Taft BRAVO, MD Assistants: NO Location: [facility] OR ROOM 4 Anesthesia: General Antibiotics: Ancef  2 g Tourniquet time: NA Estimated Blood Loss: 100CC Complications: NonE Specimens: None   Implants: Implant Name Type Inv. Item Serial No. Manufacturer Lot No. LRB No. Used Action  NAIL TROCH GAMMA 11X18 - [id number] Nail NAIL TROCH GAMMA 11X18  [employer] [id number] Left 1 Implanted  SCREW LAG GAMMA 3 TI 10.5X105M - [id number] Screw SCREW LAG GAMMA 3 TI 10.5X105M  [employer] [id number] Left 1 Implanted  SCREW LOCKING T2 F/T 5MMX35MM - [id number] Screw SCREW LOCKING T2 F/T 5MMX35MM  [employer] [id number] Left 1 Implanted    BLOOD ADMINISTERED:none  DRAINS: none   LOCAL MEDICATIONS USED:  MARCAINE  W EPI    SPECIMEN:  No Specimen  DISPOSITION OF SPECIMEN:  N/A  COUNTS:  CORRECT   DICTATION: .Dragon Dictation   Surgeon Margrette  The patient was taken to the recovery room in stable condition  PLAN OF CARE: Discharge to home after PACU  PATIENT DISPOSITION:  PACU - hemodynamically stable.   Delay start of Pharmacological VTE agent (>24hrs) due to surgical blood loss or risk of bleeding: NO    The surgery was done as follows: The patient was identified in the preop area the surgical site (left hip) was confirmed and marked after thorough chart review including radiographs; implants were checked personally.  The patient was brought back to the operating room for anesthesia and then was placed on the fracture table. The operative leg was placed in  traction the nonoperative leg was padded and placed in a boot and abducted  The C-arm was brought in and a closed reduction was performed with the C-arm.  Multiplane x-rays were taken and the leg was manipulated until a stable reduction was obtained using traction to obtain proper neck shaft angle and then rotation to correct rotational malalignment  The left leg was prepped and draped with sterile solution. This was followed by timeout which confirmed surgical site, implants, x-ray gowns and badges.  The incision was made over over the left hip proximal to the greater trochanter  subcutaneous tissue was divided down to the fascial layer which was split in line with the skin and incision.  This was followed by blunt dissection with a finger down to the tip of the trochanter  The sharp tipped awl was passed down to the level of the lesser TROCHANTER  followed by insertion of a guidewire down to the knee.  X-ray confirmed position of the guidewire and proximal reaming was performed down to the level of the lesser trochanter.  We then passed a [employer] gamma 125 degree nail  A second incision was made distal to the initial incision and the subcutaneous tissue was divided, muscle and fascia were split down to bone   After correction for rotation (a perforating drill was used to breech the cortex of the lateral femur) followed by insertion of a threaded guidewire which was placed in the center of the femoral head on AP and lateral x-ray  We measured the pin distance at 105 and then set the reamer to the  same number  (105)  followed by reaming over the guidewire.  The lag screw was then placed over the guidewire and the guidewire was removed.  The proximal setscrew was placed traction was released compression was applied through the lag screw handle  We next used the external guide to place a third incision and then passed a cannula with a sharp tip followed by drilling with a 4.5 drill bit and a 35 locking  screw  Final images confirmed reduction of the fracture and hardware position.  The wound was irrigated with copious amounts of saline and closed in layered fashion.  The proximal wound was closed with #1 Surgilon 0 Monocryl 2-0 Monocryl the other 2 wounds were closed with 2-0 Monocryl.  We did use Surgicel powder in the proximal wound   We used 30 ml of Marcaine  with epinephrine  dividing it between each layer  A sterile bandage was applied  Postoperative plan Weightbearing as tolerated Staples if present can be removed postop day 12-14 Anticoagulation for 28 days Follow-up visit at 4 weeks for x-rays and then x-rays at 6 weeks and 12 weeks [id number]

## 2024-03-10 NOTE — Anesthesia Preprocedure Evaluation (Signed)
 Anesthesia Evaluation  Patient identified by MRN, date of birth, ID band Patient awake    Reviewed: Allergy & Precautions, H&P , NPO status , Patient's Chart, lab work & pertinent test results, reviewed documented beta blocker date and time   History of Anesthesia Complications (+) history of anesthetic complications  Airway Mallampati: II  TM Distance: >3 FB Neck ROM: full    Dental no notable dental hx.    Pulmonary COPD, Current Smoker and Patient abstained from smoking.   Pulmonary exam normal breath sounds clear to auscultation       Cardiovascular Exercise Tolerance: Good hypertension,  Rhythm:regular Rate:Normal     Neuro/Psych negative neurological ROS  negative psych ROS   GI/Hepatic negative GI ROS, Neg liver ROS,,,  Endo/Other  negative endocrine ROS    Renal/GU negative Renal ROS  negative genitourinary   Musculoskeletal   Abdominal   Peds  Hematology negative hematology ROS (+)   Anesthesia Other Findings   Reproductive/Obstetrics negative OB ROS                              Anesthesia Physical Anesthesia Plan  ASA: 2  Anesthesia Plan: General and General LMA   Post-op Pain Management:    Induction:   PONV Risk Score and Plan: Ondansetron   Airway Management Planned:   Additional Equipment:   Intra-op Plan:   Post-operative Plan:   Informed Consent: I have reviewed the patients History and Physical, chart, labs and discussed the procedure including the risks, benefits and alternatives for the proposed anesthesia with the patient or authorized representative who has indicated his/her understanding and acceptance.     Dental Advisory Given  Plan Discussed with: CRNA  Anesthesia Plan Comments:         Anesthesia Quick Evaluation

## 2024-03-10 NOTE — Interval H&P Note (Signed)
 History and Physical Interval Note:  03/10/2024 1:09 PM  Eric Hinton  has presented today for surgery, with the diagnosis of left hip intertrochanteric hip fracture.  The various methods of treatment have been discussed with the patient and family. After consideration of risks, benefits and other options for treatment, the patient has consented to  Procedures: FIXATION, FRACTURE, INTERTROCHANTERIC, WITH INTRAMEDULLARY ROD (Left) as a surgical intervention.  The patient's history has been reviewed, patient examined, no change in status, stable for surgery.  I have reviewed the patient's chart and labs.  Questions were answered to the patient's satisfaction.     [name]

## 2024-03-10 NOTE — Brief Op Note (Signed)
 03/10/2024  3:36 PM  PATIENT:  Lynwood SQUIBB Shelley  83 y.o. male  PRE-OPERATIVE DIAGNOSIS:  left hip intertrochanteric hip fracture  POST-OPERATIVE DIAGNOSIS:  left intertrochanteric hip fracture  PROCEDURE:  Procedures: FIXATION, FRACTURE, INTERTROCHANTERIC, WITH INTRAMEDULLARY ROD (Left)    GAMMA NAIL    SURGEON:  Surgeons and Role:    * Margrette Taft BRAVO, MD - Primary  PHYSICIAN ASSISTANT:   ASSISTANTS: none   ANESTHESIA:   general  EBL:  100 mL   BLOOD ADMINISTERED:none  DRAINS: none   LOCAL MEDICATIONS USED:  MARCAINE      SPECIMEN:  No Specimen  DISPOSITION OF SPECIMEN:  N/A  COUNTS:  YES  TOURNIQUET:  * No tourniquets in log *  DICTATION: .Dragon Dictation  PLAN OF CARE: Admit to inpatient   PATIENT DISPOSITION:  PACU - hemodynamically stable.   Delay start of Pharmacological VTE agent (>24hrs) due to surgical blood loss or risk of bleeding: no

## 2024-03-10 NOTE — Plan of Care (Signed)

## 2024-03-11 ENCOUNTER — Other Ambulatory Visit (HOSPITAL_COMMUNITY): Payer: Self-pay

## 2024-03-11 ENCOUNTER — Encounter (HOSPITAL_COMMUNITY): Payer: Self-pay | Admitting: Orthopedic Surgery

## 2024-03-11 ENCOUNTER — Telehealth (HOSPITAL_COMMUNITY): Payer: Self-pay

## 2024-03-11 DIAGNOSIS — N400 Enlarged prostate without lower urinary tract symptoms: Secondary | ICD-10-CM

## 2024-03-11 DIAGNOSIS — I10 Essential (primary) hypertension: Secondary | ICD-10-CM

## 2024-03-11 DIAGNOSIS — J449 Chronic obstructive pulmonary disease, unspecified: Secondary | ICD-10-CM | POA: Diagnosis not present

## 2024-03-11 DIAGNOSIS — S72142A Displaced intertrochanteric fracture of left femur, initial encounter for closed fracture: Secondary | ICD-10-CM

## 2024-03-11 LAB — BASIC METABOLIC PANEL WITH GFR
Anion gap: 7 (ref 5–15)
BUN: 16 mg/dL (ref 8–23)
CO2: 27 mmol/L (ref 22–32)
Calcium: 8.6 mg/dL — ABNORMAL LOW (ref 8.9–10.3)
Chloride: 104 mmol/L (ref 98–111)
Creatinine, Ser: 0.94 mg/dL (ref 0.61–1.24)
GFR, Estimated: >60 mL/min (ref >60)
Glucose, Bld: 97 mg/dL (ref 70–99)
Potassium: 4.7 mmol/L (ref 3.5–5.1)
Sodium: 138 mmol/L (ref 135–145)

## 2024-03-11 LAB — CBC
HCT: 27.6 % — ABNORMAL LOW (ref 39.0–52.0)
Hemoglobin: 9 g/dL — ABNORMAL LOW (ref 13.0–17.0)
MCH: 31.1 pg (ref 26.0–34.0)
MCHC: 32.6 g/dL (ref 30.0–36.0)
MCV: 95.5 fL (ref 80.0–100.0)
Platelets: 161 K/uL (ref 150–400)
RBC: 2.89 MIL/uL — ABNORMAL LOW (ref 4.22–5.81)
RDW: 13.9 % (ref 11.5–15.5)
WBC: 8.8 K/uL (ref 4.0–10.5)
nRBC: 0 % (ref 0.0–0.2)

## 2024-03-11 MED ORDER — SODIUM CHLORIDE 0.9 % IV BOLUS
500.0000 mL | Freq: Once | INTRAVENOUS | Status: AC
  Administered 2024-03-11: 09:00:00 500 mL via INTRAVENOUS

## 2024-03-11 NOTE — NC FL2 (Signed)
 [place]  MEDICAID [id number] LEVEL OF CARE FORM     IDENTIFICATION  Patient Name: Eric Hinton Birthdate: October 22, 1940 Sex: male Admission Date (Current Location): 03/09/2024  [facility] and [place] Number:  [employer] and Address:  [facility],  [address], [place] [address]      Provider Number: [id number]  Attending Physician Name and Address:  Pearlean Manus, MD  Relative Name and Phone Number:       Current Level of Care: Hospital Recommended Level of Care: Skilled Nursing Facility Prior Approval Number:    Date Approved/Denied:   PASRR Number: [id number]  Discharge Plan: SNF    Current Diagnoses: Patient Active Problem List   Diagnosis Date Noted   HTN (hypertension) 03/10/2024   COPD (chronic obstructive pulmonary disease) (HCC) 03/10/2024   BPH (benign prostatic hyperplasia) 03/10/2024   Closed displaced intertrochanteric fracture of left femur (HCC) 03/09/2024   Mediastinal adenopathy 12/11/2022   Pulmonary nodules 11/13/2022   Lung cancer (HCC) 06/07/2021   Degeneration of lumbar intervertebral disc 10/08/2017   Low back pain 09/18/2017   Hx of colonic polyps 04/03/2017   Vitreous hemorrhage, right eye (HCC) 09/11/2016   Primary open angle glaucoma of both eyes, mild stage 08/28/2016   Panuveitis, right eye 05/29/2016   Pseudophakia of both eyes 12/20/2015   Hypertensive retinopathy of both eyes 12/20/2015   Exudative age-related macular degeneration of both eyes with active choroidal neovascularization (HCC) 12/20/2015   Carotid artery stenosis, asymptomatic 10/07/2015   Symptomatic carotid artery stenosis 08/29/2015   Squamous cell carcinoma of right lung (HCC) 07/15/2015    Orientation RESPIRATION BLADDER Height & Weight     Self, Time, Situation, Place  Normal Continent Weight: 151 lb 0.2 oz (68.5 kg) Height:  5' 4 (162.6 cm)  BEHAVIORAL SYMPTOMS/MOOD NEUROLOGICAL BOWEL NUTRITION STATUS      Continent Diet (See d/c  summary)  AMBULATORY STATUS COMMUNICATION OF NEEDS Skin   Extensive Assist Verbally Surgical wounds                       Personal Care Assistance Level of Assistance  Bathing, Feeding, Dressing Bathing Assistance: Maximum assistance Feeding assistance: Limited assistance Dressing Assistance: Maximum assistance     Functional Limitations Info  Sight, Hearing, Speech Sight Info: Impaired Hearing Info: Impaired Speech Info: Adequate    SPECIAL CARE FACTORS FREQUENCY  PT (By licensed PT)     PT Frequency: 5x weekly              Contractures      Additional Factors Info  Code Status, Allergies Code Status Info: Full Allergies Info: Bupropion           Current Medications (03/11/2024):  This is the current hospital active medication list Current Facility-Administered Medications  Medication Dose Route Frequency Provider Last Rate Last Admin   0.9 %  sodium chloride  infusion   Intravenous PRN Emokpae, Courage, MD 10 mL/hr at 03/10/24 2047 New Bag at 03/10/24 2047   acetaminophen  (TYLENOL ) tablet 1,000 mg  1,000 mg Oral Q6H Margrette Taft BRAVO, MD   1,000 mg at 03/11/24 0057   docusate sodium  (COLACE) capsule 100 mg  100 mg Oral BID Margrette Taft BRAVO, MD   100 mg at 03/11/24 0841   FLUoxetine  (PROZAC ) capsule 40 mg  40 mg Oral Daily Margrette Taft BRAVO, MD   40 mg at 03/11/24 0841   hydrALAZINE  (APRESOLINE ) injection 10 mg  10 mg Intravenous Q6H PRN Margrette Taft  E, MD       ipratropium-albuterol  (DUONEB) 0.5-2.5 (3) MG/3ML nebulizer solution 3 mL  3 mL Nebulization Q4H PRN Harrison, Stanley E, MD       irbesartan  (AVAPRO ) tablet 75 mg  75 mg Oral Daily Margrette Taft BRAVO, MD   75 mg at 03/11/24 9157   menthol  (CEPACOL) lozenge 3 mg  1 lozenge Oral PRN Harrison, Stanley E, MD       Or   phenol (CHLORASEPTIC) mouth spray 1 spray  1 spray Mouth/Throat PRN Margrette Taft BRAVO, MD       methocarbamol  (ROBAXIN ) tablet 500 mg  500 mg Oral Q6H PRN Harrison, Stanley  E, MD   500 mg at 03/10/24 1733   Or   methocarbamol  (ROBAXIN ) injection 500 mg  500 mg Intravenous Q6H PRN Margrette Taft BRAVO, MD       methocarbamol  (ROBAXIN ) tablet 500 mg  500 mg Oral TID Harrison, Stanley E, MD   500 mg at 03/11/24 9157   metoCLOPramide  (REGLAN ) tablet 5-10 mg  5-10 mg Oral Q8H PRN Margrette Taft BRAVO, MD       Or   metoCLOPramide  (REGLAN ) injection 5-10 mg  5-10 mg Intravenous Q8H PRN Harrison, Stanley E, MD       morphine  (PF) 2 MG/ML injection 2 mg  2 mg Intravenous Q2H PRN Margrette Taft BRAVO, MD       ondansetron  (ZOFRAN ) tablet 4 mg  4 mg Oral Q6H PRN Margrette Taft BRAVO, MD       Or   ondansetron  (ZOFRAN ) injection 4 mg  4 mg Intravenous Q6H PRN Harrison, Stanley E, MD       polyethylene glycol (MIRALAX  / GLYCOLAX ) packet 17 g  17 g Oral Daily Margrette Taft BRAVO, MD   17 g at 03/11/24 9157   rivaroxaban  (XARELTO ) tablet 10 mg  10 mg Oral Q supper Pearlean Manus, MD   10 mg at 03/11/24 0844   tamsulosin  (FLOMAX ) capsule 0.4 mg  0.4 mg Oral QPM Margrette Taft BRAVO, MD   0.4 mg at 03/10/24 1733   traMADol  (ULTRAM ) tablet 50 mg  50 mg Oral Q6H Harrison, Stanley E, MD   50 mg at 03/11/24 9157     Discharge Medications: Please see discharge summary for a list of discharge medications.  Relevant Imaging Results:  Relevant Lab Results:   Additional Information SSN: [id number]  [place]ur Saddie Kim, [name]

## 2024-03-11 NOTE — Evaluation (Signed)
 Physical Therapy Evaluation Patient Details Name: Eric Hinton MRN: [id number] DOB: 1940/08/07 Today's Date: 03/11/2024  History of Present Illness  Eric Hinton is a 83 y.o. male s/p Left hip ORIF on 03/10/24, with medical history significant of COPD, degenerative disc disease, hypertension, hyperlipidemia, history of prostate cancer, squamous cell carcinoma of the lungs, skin cancer, who has been otherwise well until today when he slipped and fell landing on his left hip subsequently sustained acute left hip pain and was unable to get out of the floor.  Patient's daughter found him laying on the floor and subsequently called EMS.  Patient admits to pain with intensity 9/10 localized to the left hip.  Denies any other acute complaints.   Clinical Impression  Patient demonstrates slow labored movement for sitting up at bedside requiring assistance for moving LLE due to increased pain/weakness, very unsteady on feet, limited to a few side step before having to sit due to weakness, LLE pain and fall risk. Patient tolerated sitting up in chair after therapy. Patient will benefit from continued skilled physical therapy in hospital and recommended venue below to increase strength, balance, endurance for safe ADLs and gait.           If plan is discharge home, recommend the following: A lot of help with bathing/dressing/bathroom;A lot of help with walking and/or transfers;Help with stairs or ramp for entrance;Assistance with cooking/housework;Assist for transportation   Can travel by private vehicle   No    Equipment Recommendations None recommended by PT  Recommendations for Other Services       Functional Status Assessment Patient has had a recent decline in their functional status and demonstrates the ability to make significant improvements in function in a reasonable and predictable amount of time.     Precautions / Restrictions Precautions Precautions: Fall Recall of  Precautions/Restrictions: Intact Restrictions Weight Bearing Restrictions Per Provider Order: Yes LLE Weight Bearing Per Provider Order: Weight bearing as tolerated      Mobility  Bed Mobility Overal bed mobility: Needs Assistance Bed Mobility: Sit to Supine       Sit to supine: Mod assist   General bed mobility comments: increased time, labored movement with c/o increasing left hip pain    Transfers Overall transfer level: Needs assistance Equipment used: Rolling walker (2 wheels) Transfers: Sit to/from Stand, Bed to chair/wheelchair/BSC Sit to Stand: Mod assist   Step pivot transfers: Mod assist       General transfer comment: slow labored movement with difficulty advancing LLE due to pain/weakness    Ambulation/Gait Ambulation/Gait assistance: Mod assist, Max assist Gait Distance (Feet): 5 Feet Assistive device: Rolling walker (2 wheels) Gait Pattern/deviations: Decreased step length - right, Decreased step length - left, Decreased stride length, Antalgic, Shuffle Gait velocity: slow     General Gait Details: limited to a few slow labored unsteady side steps before having to sit due to fatigue, weakness and increasing left hip pain  Stairs            Wheelchair Mobility     Tilt Bed    Modified Rankin (Stroke Patients Only)       Balance Overall balance assessment: Needs assistance Sitting-balance support: Feet supported, No upper extremity supported Sitting balance-Leahy Scale: Fair Sitting balance - Comments: fair/good seated at EOB   Standing balance support: Reliant on assistive device for balance, During functional activity, Bilateral upper extremity supported Standing balance-Leahy Scale: Poor Standing balance comment: using RW  Pertinent Vitals/Pain Pain Assessment Pain Assessment: Faces Faces Pain Scale: Hurts even more Pain Location: left hip with movement Pain Descriptors / Indicators:  Grimacing, Guarding, Sharp Pain Intervention(s): Limited activity within patient's tolerance, Monitored during session, Repositioned    Home Living Family/patient expects to be discharged to:: Private residence Living Arrangements: Alone Available Help at Discharge: Personal care attendant Type of Home: House Home Access: Level entry       Home Layout: One level Home Equipment: Agricultural Consultant (2 wheels)      Prior Function Prior Level of Function : Needs assist       Physical Assist : Mobility (physical);ADLs (physical) Mobility (physical): Bed mobility;Transfers;Gait;Stairs   Mobility Comments: Household ambulation without AD, legally blind, does not drive ADLs Comments: Assisted with showers, dressing and household ADLs by home aides 4 hours/day x 5 days/week     Extremity/Trunk Assessment   Upper Extremity Assessment Upper Extremity Assessment: Generalized weakness    Lower Extremity Assessment Lower Extremity Assessment: Generalized weakness;LLE deficits/detail LLE Deficits / Details: grossly 3/5 LLE: Unable to fully assess due to pain LLE Sensation: WNL LLE Coordination: WNL    Cervical / Trunk Assessment Cervical / Trunk Assessment: Normal  Communication   Communication Communication: No apparent difficulties    Cognition Arousal: Alert Behavior During Therapy: WFL for tasks assessed/performed   PT - Cognitive impairments: No apparent impairments                         Following commands: Intact       Cueing Cueing Techniques: Verbal cues, Tactile cues     General Comments      Exercises     Assessment/Plan    PT Assessment Patient needs continued PT services  PT Problem List Decreased strength;Decreased activity tolerance;Decreased balance;Decreased mobility;Pain       PT Treatment Interventions DME instruction;Gait training;Stair training;Functional mobility training;Therapeutic activities;Therapeutic exercise;Balance  training;Patient/family education    PT Goals (Current goals can be found in the Care Plan section)  Acute Rehab PT Goals Patient Stated Goal: return home after rehab PT Goal Formulation: With patient Time For Goal Achievement: 03/25/24 Potential to Achieve Goals: Good    Frequency Min 4X/week     Co-evaluation               AM-PAC PT 6 Clicks Mobility  Outcome Measure Help needed turning from your back to your side while in a flat bed without using bedrails?: A Lot Help needed moving from lying on your back to sitting on the side of a flat bed without using bedrails?: A Lot Help needed moving to and from a bed to a chair (including a wheelchair)?: A Lot Help needed standing up from a chair using your arms (e.g., wheelchair or bedside chair)?: A Lot Help needed to walk in hospital room?: A Lot Help needed climbing 3-5 steps with a railing? : Total 6 Click Score: 11    End of Session   Activity Tolerance: Patient tolerated treatment well;Patient limited by fatigue;Patient limited by pain Patient left: in chair;with call bell/phone within reach Nurse Communication: Mobility status PT Visit Diagnosis: Unsteadiness on feet (R26.81);Other abnormalities of gait and mobility (R26.89);Muscle weakness (generalized) (M62.81)    Time: 9049-8983 PT Time Calculation (min) (ACUTE ONLY): 26 min   Charges:   PT Evaluation $PT Eval Moderate Complexity: 1 Mod PT Treatments $Therapeutic Activity: 23-37 mins PT General Charges $$ ACUTE PT VISIT: 1 Visit  1:51 PM, 03/11/2024 Lynwood Music, MPT Physical Therapist with [facility] [phone] office [phone] mobile phone

## 2024-03-11 NOTE — Progress Notes (Signed)
 PROGRESS NOTE  Eric Hinton, is a 83 y.o. male, DOB - 22-May-1940, FMW:[id number]  Admit date - 03/09/2024   Admitting Physician Drue ONEIDA Potter, MD  Outpatient Primary MD for the patient is [name], MD  LOS - 2  Chief Complaint  Patient presents with   Fall      Brief Narrative:  83 y.o. male with medical history significant of COPD, degenerative disc disease, hypertension, hyperlipidemia, history of prostate cancer, squamous cell carcinoma of the lungs, skin cancer who is legally blind admitted on 03/09/2024 with left hip fracture after mechanical fall at home  -Assessment and Plan: 1)Lt Hip Fx--s/p mechanical fall at home with left hip fracture - s/p  ORIF by orthopedic team on 03/09/2024 - Further management per orthopedic team = PT OT eval appreciated recommends SNF rehab --Postoperative plan Weightbearing as tolerated Staples if present can be removed postop day 12-14 Anticoagulation for 28 days ( Xarelto  10 mg daily for DVT prophylaxis while in the hospital starting p.m. of 03/11/2024, may discharge on aspirin  81 mg twice daily for 1 month) Follow-up visit at 4 weeks for x-rays and then x-rays at 6 weeks and 12 weeks  2)Mild Acute Anemia--suspect related to #1 above - Monitor closely postoperatively especially while on DVT prophylaxis  3)HTN--BP soft , okay to stop Avapro  - IV hydralazine  as needed elevated BP  4)HLD-atorvastatin  due to fall and risk for muscle injury/rhabdo  5)COPD--stable, no acute exacerbation--May use as needed bronchodilators  6)History of prostate CA/BPH--- continue Flomax   7)Depression--- continue Prozac   8)Disposition--- PT OT eval appreciated recommends SNF rehab [place]---PTA pt lived alone and did very poorly, patient has significant limitations with mobility related ADLs- this patient needs to continue to be monitored in the hospital until a SNF bed is obtained as she is not safe to go home with his current physcical  limitations  Status is: Inpatient   Disposition: The patient is from: Home              Anticipated d/c is to: SNF              Anticipated d/c date is: 1 day              Patient currently is not medically stable to d/c. Barriers: Not Clinically Stable-   Code Status :  -  Code Status: Full Code   Family Communication:   NA (patient is alert, awake and coherent)   Plan of care discussed with patient's son Brayton at bedside  DVT Prophylaxis  :   - SCDs  rivaroxaban  (XARELTO ) tablet 10 mg Start: 03/11/24 1000 SCDs Start: 03/10/24 1603 Place TED hose Start: 03/10/24 1603 SCDs Start: 03/09/24 2103 rivaroxaban  (XARELTO ) tablet 10 mg   Lab Results  Component Value Date   PLT 161 03/11/2024   Inpatient Medications  Scheduled Meds:  acetaminophen   1,000 mg Oral Q6H   docusate sodium   100 mg Oral BID   FLUoxetine   40 mg Oral Daily   irbesartan   75 mg Oral Daily   methocarbamol   500 mg Oral TID   polyethylene glycol  17 g Oral Daily   rivaroxaban   10 mg Oral Q supper   tamsulosin   0.4 mg Oral QPM   traMADol   50 mg Oral Q6H   Continuous Infusions:  sodium chloride  10 mL/hr at 03/10/24 2047   PRN Meds:.sodium chloride , hydrALAZINE , ipratropium-albuterol , menthol  **OR** phenol, methocarbamol  **OR** methocarbamol  (ROBAXIN ) injection, metoCLOPramide  **OR** metoCLOPramide  (REGLAN ) injection, morphine  injection, ondansetron  **OR** ondansetron  (ZOFRAN )  IV   Anti-infectives (From admission, onward)    Start     Dose/Rate Route Frequency Ordered Stop   03/10/24 2200  ceFAZolin  (ANCEF ) IVPB 2g/100 mL premix        2 g 200 mL/hr over 30 Minutes Intravenous Every 6 hours 03/10/24 1603 03/11/24 0439   03/10/24 0845  ceFAZolin  (ANCEF ) IVPB 2g/100 mL premix        2 g 200 mL/hr over 30 Minutes Intravenous On call to O.R. 03/10/24 0745 03/10/24 1425         Subjective: Eric Hinton today has no fevers, no emesis,  No chest pain,   - -BP soft otherwise no new concerns - Eating  and drinking okay -Pain control improving   Objective: Vitals:   03/11/24 0302 03/11/24 1253 03/11/24 1254 03/11/24 1258  BP: (!) 104/54 (!) 90/46 (!) 92/45 (!) 104/46  Pulse: 87 99    Resp: 18 18 18 18   Temp: 98.2 F (36.8 C) 98.3 F (36.8 C)    TempSrc: Oral Oral    SpO2: 94% 93%    Weight:      Height:        Intake/Output Summary (Last 24 hours) at 03/11/2024 1308 Last data filed at 03/11/2024 0651 Gross per 24 hour  Intake 1500 ml  Output 900 ml  Net 600 ml   Filed Weights   03/09/24 1824 03/09/24 2301 03/10/24 1254  Weight: 69.9 kg 68.5 kg 68.5 kg   Physical Exam Gen:- Awake Alert, in no acute distress HEENT:- [place].AT, No sclera icterus, patient is legally blind Neck-Supple Neck,No JVD,.  Lungs-  CTAB , fair symmetrical air movement CV- S1, S2 normal, regular  Abd-  +ve B.Sounds, Abd Soft, No tenderness,    Extremity/Skin:- No  edema, pedal pulses present  Psych-affect is appropriate, oriented x3 Neuro-no new focal deficits, no tremors MSK--postoperative wounds clean dry and intact  Data Reviewed: I have personally reviewed following labs and imaging studies  CBC: Recent Labs  Lab 03/09/24 1833 03/10/24 0515 03/10/24 1624 03/11/24 0410  WBC 8.5 6.7 9.5 8.8  NEUTROABS 7.5  --   --   --   HGB 12.8* 11.2* 10.3* 9.0*  HCT 38.5* 34.2* 30.5* 27.6*  MCV 93.9 94.5 95.0 95.5  PLT 219 191 185 161   Basic Metabolic Panel: Recent Labs  Lab 03/09/24 1833 03/10/24 0515 03/10/24 1624 03/11/24 0410  NA 137 138  --  138  K 4.3 4.2  --  4.7  CL 104 103  --  104  CO2 20* 25  --  27  GLUCOSE 114* 94  --  97  BUN 22 17  --  16  CREATININE 0.97 0.91 0.77 0.94  CALCIUM  9.4 9.0  --  8.6*   GFR: Estimated Creatinine Clearance: 49.9 mL/min (by C-G formula based on SCr of 0.94 mg/dL). Liver Function Tests: Recent Labs  Lab 03/09/24 1833  AST 18  ALT 19  ALKPHOS 123  BILITOT 0.4  PROT 6.7  ALBUMIN  4.1   Radiology Studies: DG HIP UNILAT WITH PELVIS 2-3  VIEWS LEFT Result Date: 03/10/2024 CLINICAL DATA:  Status post surgical fixation of a recently demonstrated comminuted left intertrochanteric fracture. EXAM: DG HIP (WITH OR WITHOUT PELVIS) 2-3V LEFT COMPARISON:  03/09/2024 and C-arm images obtained today. FINDINGS: Intramedullary rod and compression screw fixation of the recently demonstrated comminuted left intertrochanteric fracture. The major fragments are in anatomic position and alignment. There is continued proximal displacement of the lesser trochanter fragment. Again demonstrated  is a right ischial fracture, lower lumbar spine degenerative changes and atheromatous arterial calcifications. IMPRESSION: 1. Status post surgical fixation of the recently demonstrated comminuted left intertrochanteric fracture with the major fragments in anatomic position and alignment. 2. Mildly displaced right ischial fracture. Electronically Signed   By: Elspeth Bathe M.D.   On: 03/10/2024 16:20   DG HIP UNILAT WITH PELVIS 2-3 VIEWS LEFT Result Date: 03/10/2024 CLINICAL DATA:  Left hip intramedullary nail placement for a comminuted left intertrochanteric fracture. EXAM: DG HIP (WITH OR WITHOUT PELVIS) 2-3V LEFT COMPARISON:  03/09/2024 FINDINGS: Eight C-arm images of the left hip demonstrate intramedullary rod and screw placement with anatomic position and alignment of the major fragments of the recently demonstrated left intertrochanteric fracture. There is persistent proximal displacement of the lesser trochanter fragment. IMPRESSION: Internal fixation of the left intertrochanteric fracture with anatomic position and alignment of the major fragments. Electronically Signed   By: Elspeth Bathe M.D.   On: 03/10/2024 16:16   DG C-Arm 1-60 Min-No Report Result Date: 03/10/2024 Fluoroscopy was utilized by the requesting physician.  No radiographic interpretation.   DG Chest Port 1 View Result Date: 03/09/2024 EXAM: 1 VIEW(S) XRAY OF THE CHEST 03/09/2024 09:45:00 PM  COMPARISON: 12/11/2022 CLINICAL HISTORY: COPD (chronic obstructive pulmonary disease) (HCC) FINDINGS: LUNGS AND PLEURA: Persistent right upper lung nodular opacities with adjacent fiducial markers. Persistent left upper lung fiducial markers. Chronic coarsened interstitial markings without pulmonary edema. No pleural effusion. No pneumothorax. HEART AND MEDIASTINUM: Atherosclerotic plaque. No acute abnormality of the cardiac and mediastinal silhouettes. BONES AND SOFT TISSUES: Left neck surgical clips. No acute osseous abnormality. IMPRESSION: 1. Persistent upper lobe nodular opacities bilaterally with fiducial markings 2. Chronic coarsened interstitial markings without pulmonary edema. Electronically signed by: Oneil Devonshire MD 03/09/2024 10:01 PM EST RP Workstation: [id number]   DG Hip Unilat W or Wo Pelvis 2-3 Views Left Result Date: 03/09/2024 EXAM: 2 OR MORE VIEW(S) XRAY OF THE LEFT HIP 03/09/2024 07:08:44 PM COMPARISON: None available. CLINICAL HISTORY: fall FINDINGS: BONES AND JOINTS: Acute comminuted impacted left femoral intratrochanteric fracture. Mild displacement of lesser trochanteric fragment. SOFT TISSUES: Vascular calcifications. LUMBAR SPINE: Degenerative changes of the lower lumbar spine. IMPRESSION: 1. Acute comminuted impacted left femoral intertrochanteric fracture with mild displacement of the lesser trochanteric fragment. 2. Degenerative changes of the lower lumbar spine. 3. Vascular calcifications. Electronically signed by: Greig Pique MD 03/09/2024 07:30 PM EST RP Workstation: [id number]   Scheduled Meds:  acetaminophen   1,000 mg Oral Q6H   docusate sodium   100 mg Oral BID   FLUoxetine   40 mg Oral Daily   irbesartan   75 mg Oral Daily   methocarbamol   500 mg Oral TID   polyethylene glycol  17 g Oral Daily   rivaroxaban   10 mg Oral Q supper   tamsulosin   0.4 mg Oral QPM   traMADol   50 mg Oral Q6H   Continuous Infusions:  sodium chloride  10 mL/hr at 03/10/24 2047    LOS: 2  days   Rendall Carwin M.D on 03/11/2024 at 1:08 PM  Go to [email or web address] - for contact info  [facility] - Office  [phone]  If 7PM-7AM, please contact night-coverage [email or web address] 03/11/2024, 1:08 PM

## 2024-03-11 NOTE — NC FL2 (Deleted)
 [place]  MEDICAID FL2 LEVEL OF CARE FORM     IDENTIFICATION  Patient Name: Eric Hinton Birthdate: 15-Jun-1940 Sex: male Admission Date (Current Location): 03/09/2024  [facility] and [place] Number:  [employer] and Address:  [facility],  [address] [place] [address]      Provider Number: [id number]  Attending Physician Name and Address:  Pearlean Manus, MD  Relative Name and Phone Number:       Current Level of Care: Hospital Recommended Level of Care: Skilled Nursing Facility Prior Approval Number:    Date Approved/Denied:   PASRR Number: [id number]  Discharge Plan: SNF    Current Diagnoses: Patient Active Problem List   Diagnosis Date Noted   HTN (hypertension) 03/10/2024   COPD (chronic obstructive pulmonary disease) (HCC) 03/10/2024   BPH (benign prostatic hyperplasia) 03/10/2024   Closed displaced intertrochanteric fracture of left femur (HCC) 03/09/2024   Mediastinal adenopathy 12/11/2022   Pulmonary nodules 11/13/2022   Lung cancer (HCC) 06/07/2021   Degeneration of lumbar intervertebral disc 10/08/2017   Low back pain 09/18/2017   Hx of colonic polyps 04/03/2017   Vitreous hemorrhage, right eye (HCC) 09/11/2016   Primary open angle glaucoma of both eyes, mild stage 08/28/2016   Panuveitis, right eye 05/29/2016   Pseudophakia of both eyes 12/20/2015   Hypertensive retinopathy of both eyes 12/20/2015   Exudative age-related macular degeneration of both eyes with active choroidal neovascularization (HCC) 12/20/2015   Carotid artery stenosis, asymptomatic 10/07/2015   Symptomatic carotid artery stenosis 08/29/2015   Squamous cell carcinoma of right lung (HCC) 07/15/2015    Orientation RESPIRATION BLADDER Height & Weight     Self, Time, Situation, Place  Normal Continent Weight: 151 lb 0.2 oz (68.5 kg) Height:  5' 4 (162.6 cm)  BEHAVIORAL SYMPTOMS/MOOD NEUROLOGICAL BOWEL NUTRITION STATUS      Continent Diet (See d/c  summary)  AMBULATORY STATUS COMMUNICATION OF NEEDS Skin   Extensive Assist Verbally Surgical wounds                       Personal Care Assistance Level of Assistance  Bathing, Feeding, Dressing Bathing Assistance: Maximum assistance Feeding assistance: Limited assistance Dressing Assistance: Maximum assistance     Functional Limitations Info  Sight, Hearing, Speech Sight Info: Adequate Hearing Info: Adequate Speech Info: Adequate    SPECIAL CARE FACTORS FREQUENCY  PT (By licensed PT)     PT Frequency: 5x weekly              Contractures      Additional Factors Info  Code Status, Allergies Code Status Info: Full Allergies Info: Bupropion           Current Medications (03/11/2024):  This is the current hospital active medication list Current Facility-Administered Medications  Medication Dose Route Frequency Provider Last Rate Last Admin   0.9 %  sodium chloride  infusion   Intravenous PRN Pearlean Manus, MD 10 mL/hr at 03/10/24 2047 New Bag at 03/10/24 2047   acetaminophen  (TYLENOL ) tablet 1,000 mg  1,000 mg Oral Q6H Margrette Taft BRAVO, MD   1,000 mg at 03/11/24 0057   docusate sodium  (COLACE) capsule 100 mg  100 mg Oral BID Margrette Taft BRAVO, MD   100 mg at 03/11/24 0841   FLUoxetine  (PROZAC ) capsule 40 mg  40 mg Oral Daily Harrison, Stanley E, MD   40 mg at 03/11/24 0841   hydrALAZINE  (APRESOLINE ) injection 10 mg  10 mg Intravenous Q6H PRN Margrette Taft  E, MD       ipratropium-albuterol  (DUONEB) 0.5-2.5 (3) MG/3ML nebulizer solution 3 mL  3 mL Nebulization Q4H PRN Margrette Taft BRAVO, MD       irbesartan  (AVAPRO ) tablet 75 mg  75 mg Oral Daily Margrette Taft BRAVO, MD   75 mg at 03/11/24 9157   menthol  (CEPACOL) lozenge 3 mg  1 lozenge Oral PRN Harrison, Stanley E, MD       Or   phenol (CHLORASEPTIC) mouth spray 1 spray  1 spray Mouth/Throat PRN Margrette Taft BRAVO, MD       methocarbamol  (ROBAXIN ) tablet 500 mg  500 mg Oral Q6H PRN Margrette Taft BRAVO, MD   500 mg at 03/10/24 1733   Or   methocarbamol  (ROBAXIN ) injection 500 mg  500 mg Intravenous Q6H PRN Margrette Taft BRAVO, MD       methocarbamol  (ROBAXIN ) tablet 500 mg  500 mg Oral TID Harrison, Stanley E, MD   500 mg at 03/11/24 9157   metoCLOPramide  (REGLAN ) tablet 5-10 mg  5-10 mg Oral Q8H PRN Margrette Taft BRAVO, MD       Or   metoCLOPramide  (REGLAN ) injection 5-10 mg  5-10 mg Intravenous Q8H PRN Harrison, Stanley E, MD       morphine  (PF) 2 MG/ML injection 2 mg  2 mg Intravenous Q2H PRN Harrison, Stanley E, MD       ondansetron  (ZOFRAN ) tablet 4 mg  4 mg Oral Q6H PRN Margrette Taft BRAVO, MD       Or   ondansetron  (ZOFRAN ) injection 4 mg  4 mg Intravenous Q6H PRN Harrison, Stanley E, MD       polyethylene glycol (MIRALAX  / GLYCOLAX ) packet 17 g  17 g Oral Daily Margrette Taft BRAVO, MD   17 g at 03/11/24 9157   rivaroxaban  (XARELTO ) tablet 10 mg  10 mg Oral Q supper Emokpae, Courage, MD   10 mg at 03/11/24 0844   tamsulosin  (FLOMAX ) capsule 0.4 mg  0.4 mg Oral QPM Margrette Taft BRAVO, MD   0.4 mg at 03/10/24 1733   traMADol  (ULTRAM ) tablet 50 mg  50 mg Oral Q6H Harrison, Stanley E, MD   50 mg at 03/11/24 9157     Discharge Medications: Please see discharge summary for a list of discharge medications.  Relevant Imaging Results:  Relevant Lab Results:   Additional Information SSN: [id number]  Mcarthur Saddie Kim, [name]

## 2024-03-11 NOTE — Telephone Encounter (Signed)
 Pharmacy Patient Advocate Encounter  Insurance verification completed.    The patient is insured through [employer].     Ran test claim for Xarelto  20mg  tablet and the current 30 day co-pay is $43.   This test claim was processed through [facility]- copay amounts may vary at other pharmacies due to [employer], or as the patient moves through the different stages of their insurance plan.

## 2024-03-11 NOTE — Progress Notes (Signed)
 Subjective: 1 Day Post-Op Procedures (LRB): FIXATION, FRACTURE, INTERTROCHANTERIC, WITH INTRAMEDULLARY ROD (Left) Patient reports pain as 3 on 0-10 scale.    Objective: Vital signs in last 24 hours:  BP (!) 104/54 (BP Location: Right Arm)   Pulse 87   Temp 98.2 F (36.8 C) (Oral)   Resp 18   Ht 5' 4 (1.626 m)   Wt 68.5 kg   SpO2 94%   BMI 25.92 kg/m   Temp:  [97.4 F (36.3 C)-98.2 F (36.8 C)] 98.2 F (36.8 C) (12/17 0302) Pulse Rate:  [87-98] 87 (12/17 0302) Resp:  [14-18] 18 (12/17 0302) BP: (102-138)/(52-59) 104/54 (12/17 0302) SpO2:  [94 %-100 %] 94 % (12/17 0302) Weight:  [68.5 kg] 68.5 kg (12/16 1254)  Intake/Output from previous day: 12/16 0701 - 12/17 0700 In: 1500 [I.V.:1300; IV Piggyback:200] Out: 900 [Urine:800; Blood:100] Intake/Output this shift: No intake/output data recorded.  Recent Labs    03/09/24 1833 03/10/24 0515 03/10/24 1624 03/11/24 0410  HGB 12.8* 11.2* 10.3* 9.0*   Recent Labs    03/10/24 1624 03/11/24 0410  WBC 9.5 8.8  RBC 3.21* 2.89*  HCT 30.5* 27.6*  PLT 185 161   Recent Labs    03/10/24 0515 03/10/24 1624 03/11/24 0410  NA 138  --  138  K 4.2  --  4.7  CL 103  --  104  CO2 25  --  27  BUN 17  --  16  CREATININE 0.91 0.77 0.94  GLUCOSE 94  --  97  CALCIUM  9.0  --  8.6*   No results for input(s): LABPT, INR in the last 72 hours.  Neurologically intact ABD soft Neurovascular intact Sensation intact distally Intact pulses distally Dorsiflexion/Plantar flexion intact Incision: dressing C/D/I Compartment soft   Assessment/Plan: 1 Day Post-Op Procedures (LRB): FIXATION, FRACTURE, INTERTROCHANTERIC, WITH INTRAMEDULLARY ROD (Left) Up with therapy D/C IV fluids Discharge to SNF when bed becomes available     [name] 03/11/2024, 7:25 AM

## 2024-03-11 NOTE — Plan of Care (Signed)
°  Problem: Acute Rehab PT Goals(only PT should resolve) Goal: Pt Will Go Supine/Side To Sit Outcome: Progressing Flowsheets (Taken 03/11/2024 1354) Pt will go Supine/Side to Sit:  with minimal assist  with moderate assist Goal: Patient Will Transfer Sit To/From Stand Outcome: Progressing Flowsheets (Taken 03/11/2024 1354) Patient will transfer sit to/from stand:  with minimal assist  with moderate assist Goal: Pt Will Transfer Bed To Chair/Chair To Bed Outcome: Progressing Flowsheets (Taken 03/11/2024 1354) Pt will Transfer Bed to Chair/Chair to Bed:  with min assist  with mod assist Goal: Pt Will Ambulate Outcome: Progressing Flowsheets (Taken 03/11/2024 1354) Pt will Ambulate:  25 feet  with moderate assist  with rolling walker   1:55 PM, 03/11/2024 Lynwood Music, MPT Physical Therapist with [facility] [phone] office [phone] mobile phone

## 2024-03-11 NOTE — TOC Initial Note (Signed)
 Transition of Care [facility]) - Initial/Assessment Note    Patient Details  Name: Eric Hinton MRN: [id number] Date of Birth: 08-04-1940  [facility]) CM/SW Contact:    Mcarthur Saddie Kim, [name] Phone Number: 03/11/2024, 12:26 PM  Clinical Narrative: Pt admitted due to left hip fracture. Assessment completed at bedside. Pt reports he lives alone. He has good family support and has a VA aid 5 hours a day, 4 days a week. PT evaluated pt and recommend SNF. Reviewed SNF options and Medicare.gov ratings. [facility] choice is [facility], but wants to be as close as possible to local family. [name] discussed above with pt's son as well. Will initiate bed search and follow up with bed offers when available.                   Expected Discharge Plan: Skilled Nursing Facility Barriers to Discharge: Continued Medical Work up   Patient Goals and CMS Choice Patient states their goals for this hospitalization and ongoing recovery are:: SNF   Choice offered to / list presented to : Patient, Adult Children [place] ownership interest in [facility].provided to:: Adult Children    Expected Discharge Plan and Services In-house Referral: Clinical Social Work     Living arrangements for the past 2 months: Single Family Home                                      Prior Living Arrangements/Services Living arrangements for the past 2 months: Single Family Home Lives with:: Self Patient language and need for interpreter reviewed:: Yes Do you feel safe going back to the place where you live?: Yes      Need for Family Participation in Patient Care: No (Comment) Care giver support system in place?: Yes (comment) Current home services: DME (walker) Criminal Activity/Legal Involvement Pertinent to Current Situation/Hospitalization: No - Comment as needed  Activities of Daily Living   ADL Screening (condition at time of admission) Independently performs ADLs?: Yes (appropriate for  developmental age) Is the patient deaf or have difficulty hearing?: No Does the patient have difficulty seeing, even when wearing glasses/contacts?: Yes Does the patient have difficulty concentrating, remembering, or making decisions?: No  Permission Sought/Granted                  Emotional Assessment     Affect (typically observed): Appropriate Orientation: : Oriented to Self, Oriented to Place, Oriented to  Time, Oriented to Situation Alcohol / Substance Use: Not Applicable Psych Involvement: No (comment)  Admission diagnosis:  Closed left hip fracture (HCC) [S72.002A] S/p left hip fracture [Z87.81] Patient Active Problem List   Diagnosis Date Noted   HTN (hypertension) 03/10/2024   COPD (chronic obstructive pulmonary disease) (HCC) 03/10/2024   BPH (benign prostatic hyperplasia) 03/10/2024   Closed displaced intertrochanteric fracture of left femur (HCC) 03/09/2024   Mediastinal adenopathy 12/11/2022   Pulmonary nodules 11/13/2022   Lung cancer (HCC) 06/07/2021   Degeneration of lumbar intervertebral disc 10/08/2017   Low back pain 09/18/2017   Hx of colonic polyps 04/03/2017   Vitreous hemorrhage, right eye (HCC) 09/11/2016   Primary open angle glaucoma of both eyes, mild stage 08/28/2016   Panuveitis, right eye 05/29/2016   Pseudophakia of both eyes 12/20/2015   Hypertensive retinopathy of both eyes 12/20/2015   Exudative age-related macular degeneration of both eyes with active choroidal neovascularization (HCC) 12/20/2015  Carotid artery stenosis, asymptomatic 10/07/2015   Symptomatic carotid artery stenosis 08/29/2015   Squamous cell carcinoma of right lung (HCC) 07/15/2015   PCP:  [name], MD Pharmacy:   [facility] DELIVERY - [address], [place] - [address] [place] [address] Phone: [phone] Fax: [phone]  [facility] [phone] - [place], [place] - [address] AT [facility] [address] [place] [address] Phone: [phone] Fax: [phone]     Social Drivers of Health (SDOH) Social History: SDOH Screenings   Food Insecurity: No Food Insecurity (03/09/2024)  Housing: Low Risk (03/09/2024)  Transportation Needs: No Transportation Needs (03/09/2024)  Utilities: Not At Risk (03/09/2024)  Depression (PHQ2-9): Low Risk (02/12/2024)  Social Connections: Moderately Isolated (03/09/2024)  Tobacco Use: High Risk (03/10/2024)   SDOH Interventions:     Readmission Risk Interventions     No data to display

## 2024-03-12 DIAGNOSIS — J449 Chronic obstructive pulmonary disease, unspecified: Secondary | ICD-10-CM | POA: Diagnosis not present

## 2024-03-12 DIAGNOSIS — S72142A Displaced intertrochanteric fracture of left femur, initial encounter for closed fracture: Secondary | ICD-10-CM | POA: Diagnosis not present

## 2024-03-12 DIAGNOSIS — I10 Essential (primary) hypertension: Secondary | ICD-10-CM | POA: Diagnosis not present

## 2024-03-12 LAB — CBC
HCT: 24.6 % — ABNORMAL LOW (ref 39.0–52.0)
Hemoglobin: 8.1 g/dL — ABNORMAL LOW (ref 13.0–17.0)
MCH: 31.6 pg (ref 26.0–34.0)
MCHC: 32.9 g/dL (ref 30.0–36.0)
MCV: 96.1 fL (ref 80.0–100.0)
Platelets: 145 K/uL — ABNORMAL LOW (ref 150–400)
RBC: 2.56 MIL/uL — ABNORMAL LOW (ref 4.22–5.81)
RDW: 14.2 % (ref 11.5–15.5)
WBC: 6.3 K/uL (ref 4.0–10.5)
nRBC: 0 % (ref 0.0–0.2)

## 2024-03-12 LAB — BASIC METABOLIC PANEL WITH GFR
Anion gap: 6 (ref 5–15)
BUN: 20 mg/dL (ref 8–23)
CO2: 28 mmol/L (ref 22–32)
Calcium: 8.4 mg/dL — ABNORMAL LOW (ref 8.9–10.3)
Chloride: 105 mmol/L (ref 98–111)
Creatinine, Ser: 0.96 mg/dL (ref 0.61–1.24)
GFR, Estimated: >60 mL/min (ref >60)
Glucose, Bld: 89 mg/dL (ref 70–99)
Potassium: 3.6 mmol/L (ref 3.5–5.1)
Sodium: 138 mmol/L (ref 135–145)

## 2024-03-12 MED ORDER — POLYETHYLENE GLYCOL 3350 17 G PO PACK: 17.0000 g | PACK | Freq: Every day | ORAL | 0 refills | Status: AC | PRN

## 2024-03-12 MED ORDER — RIVAROXABAN 10 MG PO TABS
10.0000 mg | ORAL_TABLET | Freq: Once | ORAL | Status: AC
  Administered 2024-03-12: 14:00:00 10 mg via ORAL
  Filled 2024-03-12: qty 1

## 2024-03-12 MED ORDER — SENNOSIDES-DOCUSATE SODIUM 8.6-50 MG PO TABS: 2.0000 | ORAL_TABLET | Freq: Every day | ORAL | 1 refills | Status: AC

## 2024-03-12 MED ORDER — METHOCARBAMOL 500 MG PO TABS: 500.0000 mg | ORAL_TABLET | Freq: Three times a day (TID) | ORAL | 0 refills | Status: AC

## 2024-03-12 MED ORDER — TAMSULOSIN HCL 0.4 MG PO CAPS: 0.4000 mg | ORAL_CAPSULE | Freq: Every day | ORAL | 3 refills | Status: AC

## 2024-03-12 MED ORDER — ACETAMINOPHEN 325 MG PO TABS: 650.0000 mg | ORAL_TABLET | ORAL | Status: AC | PRN

## 2024-03-12 NOTE — Plan of Care (Signed)

## 2024-03-12 NOTE — Discharge Summary (Signed)
 Eric Hinton, is a 83 y.o. male  DOB [birth year]  MRN [id number].  Admission date:  03/09/2024  Admitting Physician  Drue ONEIDA Potter, MD  Discharge Date:  03/12/2024   Primary MD  [name], MD  Recommendations for primary care physician for things to follow:  1)Please take Aspirin  81 mg twice daily with meals For Post Hip Surgery DVT Prophylaxis for 30 days Only 2)Avoid ibuprofen/Advil/Aleve/Motrin/Goody Powders/Naproxen/BC powders/Meloxicam/Diclofenac/Indomethacin and other Nonsteroidal anti-inflammatory medications as these will make you more likely to bleed and can cause stomach ulcers, can also cause Kidney problems.  3)Repeat CBC Blood Test in 4 to 5 days 4)Postoperative plan Weightbearing as tolerated Staples if present can be removed postop day 12-14 Follow-up visit at 4 weeks for x-rays and then x-rays at 6 weeks and 12 weeks 5)Follow up with Orthopedic Surgeon---Dr. [name], MD---in 4 weeks  -Address: [address] [place], [place] [address] Phone: [phone]  Admission Diagnosis  Closed left hip fracture (HCC) [S72.002A] S/p left hip fracture [Z87.81]   Discharge Diagnosis  Closed left hip fracture (HCC) [S72.002A] S/p left hip fracture [Z87.81]    Principal Problem:   Closed displaced intertrochanteric fracture of left femur (HCC) Active Problems:   BPH (benign prostatic hyperplasia)   HTN (hypertension)   COPD (chronic obstructive pulmonary disease) (HCC)      Past Medical History:  Diagnosis Date   Arthritis    Carotid artery disease    Status post bilateral CEA - Dr. Oris   Complication of anesthesia    unable to void afterwards   Constipation    COPD (chronic obstructive pulmonary disease) (HCC)    DDD (degenerative disc disease), cervical    Degenerative disc disease, lumbar    Essential hypertension    History of radiation therapy    Right  lung-06/25/23-07/02/23- Dr. Lynwood Nasuti   History of radiation therapy    Right Lung-10/21/23-11/04/23- Dr. Lynwood Nasuti   Hyperlipidemia    Macular degeneration    Prostate cancer [facility])    Skin cancer, basal cell    Squamous cell lung cancer (HCC) 07/15/2015   Status post chemotherapy and XRT - Dr. Sherrod   Tubular adenoma of colon     Past Surgical History:  Procedure Laterality Date   BRONCHIAL BIOPSY  12/11/2022   Procedure: BRONCHIAL BIOPSIES;  Surgeon: Shelah Lamar RAMAN, MD;  Location: [facility] ENDOSCOPY;  Service: Pulmonary;;   BRONCHIAL BRUSHINGS  12/11/2022   Procedure: BRONCHIAL BRUSHINGS;  Surgeon: Shelah Lamar RAMAN, MD;  Location: [facility] ENDOSCOPY;  Service: Pulmonary;;   BRONCHIAL NEEDLE ASPIRATION BIOPSY  12/11/2022   Procedure: BRONCHIAL NEEDLE ASPIRATION BIOPSIES;  Surgeon: Shelah Lamar RAMAN, MD;  Location: [facility] ENDOSCOPY;  Service: Pulmonary;;   BRONCHIAL WASHINGS  12/11/2022   Procedure: BRONCHIAL WASHINGS;  Surgeon: Shelah Lamar RAMAN, MD;  Location: [facility] ENDOSCOPY;  Service: Pulmonary;;   CATARACT EXTRACTION W/ INTRAOCULAR LENS  IMPLANT, BILATERAL Bilateral    COLONOSCOPY     COLONOSCOPY WITH PROPOFOL  N/A 05/17/2017   Procedure: COLONOSCOPY WITH PROPOFOL ;  Surgeon:  Golda Claudis PENNER, MD;  Location: [facility] ENDO SUITE;  Service: Endoscopy;  Laterality: N/A;  10:15   ENDARTERECTOMY Right 08/29/2015   Procedure: RIGHT CAROTID ENDARTERECTOMY WITH PATCH ANGIOPLASTY;  Surgeon: Krystal JULIANNA Doing, MD;  Location: [facility] OR;  Service: Vascular;  Laterality: Right;   ENDARTERECTOMY Left 10/07/2015   Procedure: LEFT CAROTID ARTERY ENDARTERECTOMY;  Surgeon: Krystal JULIANNA Doing, MD;  Location: [facility] OR;  Service: Vascular;  Laterality: Left;   FIDUCIAL MARKER PLACEMENT  12/11/2022   Procedure: FIDUCIAL MARKER PLACEMENT;  Surgeon: Shelah Lamar RAMAN, MD;  Location: [facility] ENDOSCOPY;  Service: Pulmonary;;   FINE NEEDLE ASPIRATION  12/11/2022   Procedure: FINE NEEDLE ASPIRATION;  Surgeon: Shelah Lamar RAMAN, MD;  Location: [facility] ENDOSCOPY;   Service: Pulmonary;;   INTRAMEDULLARY (IM) NAIL INTERTROCHANTERIC Left 03/10/2024   Procedure: FIXATION, FRACTURE, INTERTROCHANTERIC, WITH INTRAMEDULLARY ROD;  Surgeon: Margrette Taft BRAVO, MD;  Location: [facility] ORS;  Service: Orthopedics;  Laterality: Left;   NOSE SURGERY     rebulit my nose; related to skin cancer   PATCH ANGIOPLASTY Left 10/07/2015   Procedure: With HEMASHIELD PLATINUM PATCH ANGIOPLASTY;  Surgeon: Krystal JULIANNA Doing, MD;  Location: [facility] OR;  Service: Vascular;  Laterality: Left;   POLYPECTOMY  05/17/2017   Procedure: POLYPECTOMY;  Surgeon: Golda Claudis PENNER, MD;  Location: [facility] ENDO SUITE;  Service: Endoscopy;;  colon    PROSTATE BIOPSY     SKIN CANCER EXCISION Right    neck   TONSILLECTOMY     VIDEO BRONCHOSCOPY WITH ENDOBRONCHIAL ULTRASOUND N/A 12/11/2022   Procedure: VIDEO BRONCHOSCOPY WITH ENDOBRONCHIAL ULTRASOUND;  Surgeon: Shelah Lamar RAMAN, MD;  Location: [facility] ENDOSCOPY;  Service: Pulmonary;  Laterality: N/A;    HPI  from the history and physical done on the day of admission:  HPI: Eric Hinton is a 83 y.o. male with medical history significant of COPD, degenerative disc disease, hypertension, hyperlipidemia, history of prostate cancer, squamous cell carcinoma of the lungs, skin cancer, who has been otherwise well until today when he slipped and fell landing on his left hip subsequently sustained acute left hip pain and was unable to get out of the floor.  Patient's daughter found him laying on the floor and subsequently called EMS.  Patient admits to pain with intensity 9/10 localized to the left hip.  Denies any other acute complaints.   ED course: Upon arrival to the emergency room patient had temperature 98.1, respiratory rate 14, heart rate 99, blood pressure 140/76 saturating 94% on room air.  Follow-up x-ray showed acute comminuted and impacted left femoral intertrochanteric fracture with mild displacement of the lesser trochanteric fragment.  Orthopedic surgeon Dr. Margrette  was contacted who recommended patient be admitted for surgical intervention tomorrow and made n.p.o. after midnight.  Hospitalist service was therefore contacted to admit patient for further management     Review of Systems: As mentioned in the history of present illness. All other systems reviewed and are negative.    Hospital Course:   Brief Narrative:  83 y.o. male with medical history significant of COPD, degenerative disc disease, hypertension, hyperlipidemia, history of prostate cancer, squamous cell carcinoma of the lungs, skin cancer who is legally blind admitted on 03/09/2024 with left hip fracture after mechanical fall at home   -Assessment and Plan: 1)Lt Hip Fx--s/p mechanical fall at home with left hip fracture - s/p  ORIF by orthopedic team on 03/09/2024 - Further management per orthopedic team = PT OT eval appreciated recommends SNF rehab --Postoperative plan Weightbearing as tolerated Staples  if present can be removed postop day 12-14 -Discharge on aspirin  81 mg twice daily for 1 month for DVT Prophylaxis Follow-up visit at 4 weeks for x-rays and then x-rays at 6 weeks and 12 weeks   2)Mild Acute Anemia--suspect related to #1 above - Monitor closely postoperatively especially while on DVT prophylaxis - Repeat CBC in about 4 to 5 days   3)HTN--BP soft , okay to stop Avapro  - IV hydralazine  as needed elevated BP   4)HLD-okay to resume atorvastatin    5)COPD--stable, no acute exacerbation--May use as needed bronchodilators   6)History of prostate CA/BPH--- continue Flomax    7)Depression--- continue Prozac    8)Disposition--- PT OT eval appreciated recommends SNF rehab -Transfer to SNF facility  9) legally blind--- supportive care  Discharge Condition: Stable  Follow UP   Contact information for follow-up providers     Margrette Taft BRAVO, MD. Schedule an appointment as soon as possible for a visit in 1 month(s).   Specialties: Orthopedic Surgery,  Radiology Contact information: [address] [place] [address] [phone]              Contact information for after-discharge care     Destination     [facility] .   Service: Skilled Nursing Contact information: [address] [place]  [address] [phone]                     Consults obtained -orthopedics  Diet and Activity recommendation:  As advised  Discharge Instructions    Discharge Instructions     Call MD for:  difficulty breathing, headache or visual disturbances   Complete by: As directed    Call MD for:  persistant dizziness or light-headedness   Complete by: As directed    Call MD for:  redness, tenderness, or signs of infection (pain, swelling, redness, odor or green/yellow discharge around incision site)   Complete by: As directed    Call MD for:  severe uncontrolled pain   Complete by: As directed    Call MD for:  temperature >100.4   Complete by: As directed    Diet - low sodium heart healthy   Complete by: As directed    Discharge instructions   Complete by: As directed    1)Please take Aspirin  81 mg twice daily with meals For Post Hip Surgery DVT Prophylaxis for 30 days Only 2)Avoid ibuprofen/Advil/Aleve/Motrin/Goody Powders/Naproxen/BC powders/Meloxicam/Diclofenac/Indomethacin and other Nonsteroidal anti-inflammatory medications as these will make you more likely to bleed and can cause stomach ulcers, can also cause Kidney problems.  3)Repeat CBC Blood Test in 4 to 5 days 4)Postoperative plan Weightbearing as tolerated Staples if present can be removed postop day 12-14 Follow-up visit at 4 weeks for x-rays and then x-rays at 6 weeks and 12 weeks 5)Follow up with Orthopedic Surgeon---Dr. Taft Margrette, MD---in 4 weeks  -Address: [address], [place], [place] [address] Phone: [phone]   Discharge wound care:   Complete by: As directed    Staples if present  can be removed postop day 12-14   Increase activity slowly   Complete by: As directed        Discharge Medications     Allergies as of 03/12/2024       Reactions   Bupropion Hives        Medication List     STOP taking these medications    aspirin  325 MG tablet Replaced by: aspirin  EC 81 MG tablet  naproxen sodium 220 MG tablet Commonly known as: ALEVE       TAKE these medications    acetaminophen  325 MG tablet Commonly known as: Tylenol  Take 2 tablets (650 mg total) by mouth every 4 (four) hours as needed.   albuterol  108 (90 Base) MCG/ACT inhaler Commonly known as: VENTOLIN  HFA Inhale 1 puff into the lungs every 6 (six) hours as needed.   aspirin  EC 81 MG tablet Take 1 tablet (81 mg total) by mouth 2 (two) times daily with a meal. For Post Hip Surgery DVT Prophylaxis for 30 days Only Replaces: aspirin  325 MG tablet   atorvastatin  10 MG tablet Commonly known as: LIPITOR Take 10 mg by mouth daily.   ferrous sulfate 325 (65 FE) MG EC tablet Take 1 tablet (325 mg total) by mouth daily with breakfast.   FLUoxetine  40 MG capsule Commonly known as: PROZAC  Take 40 mg by mouth daily.   latanoprost  0.005 % ophthalmic solution Commonly known as: XALATAN  Place 1 drop into both eyes at bedtime.   menthol -zinc oxide powder Apply 1 application topically daily as needed (for itching).   methocarbamol  500 MG tablet Commonly known as: ROBAXIN  Take 1 tablet (500 mg total) by mouth 3 (three) times daily.   oxyCODONE  5 MG immediate release tablet Commonly known as: Roxicodone  Take 1 tablet (5 mg total) by mouth every 4 (four) hours as needed for severe pain (pain score 7-10).   polyethylene glycol 17 g packet Commonly known as: MiraLax  Take 17 g by mouth daily as needed for mild constipation.   PreserVision AREDS 2 Caps Take 1 capsule by mouth 2 (two) times daily.   senna-docusate 8.6-50 MG tablet Commonly known as: Senokot-S Take 2 tablets by mouth at  bedtime.   tamsulosin  0.4 MG Caps capsule Commonly known as: FLOMAX  Take 1 capsule (0.4 mg total) by mouth daily after supper. What changed: when to take this   telmisartan  40 MG tablet Commonly known as: MICARDIS  Take 40 mg by mouth daily.               Discharge Care Instructions  (From admission, onward)           Start     Ordered   03/12/24 0000  Discharge wound care:       Comments: Staples if present can be removed postop day 12-14   03/12/24 1244           Major procedures and Radiology Reports - PLEASE review detailed and final reports for all details, in brief -   DG HIP UNILAT WITH PELVIS 2-3 VIEWS LEFT Result Date: 03/10/2024 CLINICAL DATA:  Status post surgical fixation of a recently demonstrated comminuted left intertrochanteric fracture. EXAM: DG HIP (WITH OR WITHOUT PELVIS) 2-3V LEFT COMPARISON:  03/09/2024 and C-arm images obtained today. FINDINGS: Intramedullary rod and compression screw fixation of the recently demonstrated comminuted left intertrochanteric fracture. The major fragments are in anatomic position and alignment. There is continued proximal displacement of the lesser trochanter fragment. Again demonstrated is a right ischial fracture, lower lumbar spine degenerative changes and atheromatous arterial calcifications. IMPRESSION: 1. Status post surgical fixation of the recently demonstrated comminuted left intertrochanteric fracture with the major fragments in anatomic position and alignment. 2. Mildly displaced right ischial fracture. Electronically Signed   By: Elspeth Bathe M.D.   On: 03/10/2024 16:20   DG HIP UNILAT WITH PELVIS 2-3 VIEWS LEFT Result Date: 03/10/2024 CLINICAL DATA:  Left hip intramedullary nail placement for a comminuted left  intertrochanteric fracture. EXAM: DG HIP (WITH OR WITHOUT PELVIS) 2-3V LEFT COMPARISON:  03/09/2024 FINDINGS: Eight C-arm images of the left hip demonstrate intramedullary rod and screw placement with  anatomic position and alignment of the major fragments of the recently demonstrated left intertrochanteric fracture. There is persistent proximal displacement of the lesser trochanter fragment. IMPRESSION: Internal fixation of the left intertrochanteric fracture with anatomic position and alignment of the major fragments. Electronically Signed   By: Elspeth Bathe M.D.   On: 03/10/2024 16:16   DG C-Arm 1-60 Min-No Report Result Date: 03/10/2024 Fluoroscopy was utilized by the requesting physician.  No radiographic interpretation.   DG Chest Port 1 View Result Date: 03/09/2024 EXAM: 1 VIEW(S) XRAY OF THE CHEST 03/09/2024 09:45:00 PM COMPARISON: 12/11/2022 CLINICAL HISTORY: COPD (chronic obstructive pulmonary disease) (HCC) FINDINGS: LUNGS AND PLEURA: Persistent right upper lung nodular opacities with adjacent fiducial markers. Persistent left upper lung fiducial markers. Chronic coarsened interstitial markings without pulmonary edema. No pleural effusion. No pneumothorax. HEART AND MEDIASTINUM: Atherosclerotic plaque. No acute abnormality of the cardiac and mediastinal silhouettes. BONES AND SOFT TISSUES: Left neck surgical clips. No acute osseous abnormality. IMPRESSION: 1. Persistent upper lobe nodular opacities bilaterally with fiducial markings 2. Chronic coarsened interstitial markings without pulmonary edema. Electronically signed by: Oneil Devonshire MD 03/09/2024 10:01 PM EST RP Workstation: [id number]   DG Hip Unilat W or Wo Pelvis 2-3 Views Left Result Date: 03/09/2024 EXAM: 2 OR MORE VIEW(S) XRAY OF THE LEFT HIP 03/09/2024 07:08:44 PM COMPARISON: None available. CLINICAL HISTORY: fall FINDINGS: BONES AND JOINTS: Acute comminuted impacted left femoral intratrochanteric fracture. Mild displacement of lesser trochanteric fragment. SOFT TISSUES: Vascular calcifications. LUMBAR SPINE: Degenerative changes of the lower lumbar spine. IMPRESSION: 1. Acute comminuted impacted left femoral intertrochanteric  fracture with mild displacement of the lesser trochanteric fragment. 2. Degenerative changes of the lower lumbar spine. 3. Vascular calcifications. Electronically signed by: Greig Pique MD 03/09/2024 07:30 PM EST RP Workstation: [id number]   Today   Subjective    Eric Hinton today has no new complaints -Eating and drinking well No fever  Or chills   Patient has been seen and examined prior to discharge   Objective   Blood pressure (!) 117/58, pulse 92, temperature 98.5 F (36.9 C), temperature source Oral, resp. rate 17, height 5' 4 (1.626 m), weight 68.5 kg, SpO2 94%.   Intake/Output Summary (Last 24 hours) at 03/12/2024 1246 Last data filed at 03/12/2024 1019 Gross per 24 hour  Intake 240 ml  Output 500 ml  Net -260 ml    Exam Gen:- Awake Alert, no acute distress  HEENT:- Eric Hinton, No sclera icterus, legally blind Neck-Supple Neck,No JVD,.  Lungs-  CTAB , good air movement bilaterally CV- S1, S2 normal, regular Abd-  +ve B.Sounds, Abd Soft, No tenderness,    Extremity/Skin:- No  edema,   good pulses Psych-affect is appropriate, oriented x3 Neuro-no new focal deficits, no tremors  MSK--postoperative wounds clean dry and intact    Data Review   CBC w Diff:  Lab Results  Component Value Date   WBC 6.3 03/12/2024   HGB 8.1 (L) 03/12/2024   HGB 14.7 02/15/2020   HGB 13.8 08/03/2016   HCT 24.6 (L) 03/12/2024   HCT 40.9 08/03/2016   PLT 145 (L) 03/12/2024   PLT 197 02/15/2020   PLT 157 08/03/2016   LYMPHOPCT 6 03/09/2024   LYMPHOPCT 27.1 08/03/2016   MONOPCT 5 03/09/2024   MONOPCT 9.3 08/03/2016   EOSPCT 1 03/09/2024   EOSPCT 2.0 08/03/2016  BASOPCT 0 03/09/2024   BASOPCT 0.4 08/03/2016    CMP:  Lab Results  Component Value Date   NA 138 03/12/2024   NA 139 08/03/2016   K 3.6 03/12/2024   K 4.8 08/03/2016   CL 105 03/12/2024   CO2 28 03/12/2024   CO2 23 08/03/2016   BUN 20 03/12/2024   BUN 19.4 08/03/2016   CREATININE 0.96 03/12/2024    CREATININE 1.05 02/15/2020   CREATININE 1.1 08/03/2016   PROT 6.7 03/09/2024   PROT 7.0 08/03/2016   ALBUMIN  4.1 03/09/2024   ALBUMIN  3.9 08/03/2016   BILITOT 0.4 03/09/2024   BILITOT 0.5 02/15/2020   BILITOT 0.65 08/03/2016   ALKPHOS 123 03/09/2024   ALKPHOS 67 08/03/2016   AST 18 03/09/2024   AST 17 02/15/2020   AST 17 08/03/2016   ALT 19 03/09/2024   ALT 21 02/15/2020   ALT 24 08/03/2016   Total Discharge time is about 33 minutes  Rendall Carwin M.D on 03/12/2024 at 12:46 PM  Go to [email or web address] -  for contact info  [facility] - Office  [phone]

## 2024-03-12 NOTE — Discharge Instructions (Signed)
 1)Please take Aspirin  81 mg twice daily with meals For Post Hip Surgery DVT Prophylaxis for 30 days Only 2)Avoid ibuprofen/Advil/Aleve/Motrin/Goody Powders/Naproxen/BC powders/Meloxicam/Diclofenac/Indomethacin and other Nonsteroidal anti-inflammatory medications as these will make you more likely to bleed and can cause stomach ulcers, can also cause Kidney problems.  3)Repeat CBC Blood Test in 4 to 5 days 4)Postoperative plan Weightbearing as tolerated Staples if present can be removed postop day 12-14 Follow-up visit at 4 weeks for x-rays and then x-rays at 6 weeks and 12 weeks 5)Follow up with Orthopedic Surgeon---Dr. [name], MD---in 4 weeks  -Address: [address] [place], [place] [address] Phone: [phone]

## 2024-03-12 NOTE — Care Management Important Message (Signed)
 Important Message  Patient Details  Name: Eric Hinton MRN: [id number] Date of Birth: 1941/02/14   Important Message Given:  N/A - LOS <3 / Initial given by admissions     Dimitris Shanahan L Devonne Lalani 03/12/2024, 11:30 AM

## 2024-03-12 NOTE — Progress Notes (Signed)
 Subjective: 2 Days Post-Op Procedures (LRB): FIXATION, FRACTURE, INTERTROCHANTERIC, WITH INTRAMEDULLARY ROD (Left) CHART REVIEW   Objective: Vital signs in last 24 hours: BP (!) 117/58 (BP Location: Right Arm)   Pulse 92   Temp 98.5 F (36.9 C) (Oral)   Resp 17   Ht 5' 4 (1.626 m)   Wt 68.5 kg   SpO2 94%   BMI 25.92 kg/m   Temp:  [98.3 F (36.8 C)-98.9 F (37.2 C)] 98.5 F (36.9 C) (12/18 0415) Pulse Rate:  [92-99] 92 (12/18 0415) Resp:  [17-18] 17 (12/18 0415) BP: (90-117)/(45-58) 117/58 (12/18 0415) SpO2:  [93 %-96 %] 94 % (12/18 0415)  Intake/Output from previous day: 12/17 0701 - 12/18 0700 In: -  Out: 200 [Urine:200] Intake/Output this shift: Total I/O In: -  Out: 400 [Urine:400]  Recent Labs    03/09/24 1833 03/10/24 0515 03/10/24 1624 03/11/24 0410 03/12/24 0439  HGB 12.8* 11.2* 10.3* 9.0* 8.1*   Recent Labs    03/11/24 0410 03/12/24 0439  WBC 8.8 6.3  RBC 2.89* 2.56*  HCT 27.6* 24.6*  PLT 161 145*   Recent Labs    03/11/24 0410 03/12/24 0439  NA 138 138  K 4.7 3.6  CL 104 105  CO2 27 28  BUN 16 20  CREATININE 0.94 0.96  GLUCOSE 97 89  CALCIUM  8.6* 8.4*   No results for input(s): LABPT, INR in the last 72 hours.     Assessment/Plan: 2 Days Post-Op Procedures (LRB): FIXATION, FRACTURE, INTERTROCHANTERIC, WITH INTRAMEDULLARY ROD (Left)       [name] 03/12/2024, 9:20 AM

## 2024-03-12 NOTE — TOC Transition Note (Signed)
 Transition of Care [facility]) - Discharge Note   Patient Details  Name: Eric Hinton MRN: [id number] Date of Birth: 1940-05-02  [facility] of [facility]) CM/SW Contact:  [name], [name] Phone Number: 03/12/2024, 10:31 AM   Clinical Narrative:    Reviewed bed offer for SNF with pt. Pt accepted offer for SNF at [facility]. CSW spoke with pt's son to inform of choice and discharge plans.  Pt able to transfer to [facility] for rehab today. Pt will be going to room A2-2. RN to call report to [phone].  Medical necessity form printed to the unit. [facility] called at  2:19pm for transportation.   [name] [address] [place], [place] [address] [phone] Overall rating ?  Final next level of care: Skilled Nursing Facility Barriers to Discharge: Barriers Resolved   Patient Goals and CMS Choice Patient states their goals for this hospitalization and ongoing recovery are:: SNF CMS Medicare.gov Compare Post Acute Care list provided to:: Patient Choice offered to / list presented to : Patient [place] ownership interest in [facility].provided to:: Patient    Discharge Placement PASRR number recieved: 03/11/24            Patient chooses bed at: Other - please specify in the comment section below: [facility]) Patient to be transferred to facility by: [facility] Name of family member notified: Patient and son Patient and family notified of of transfer: 03/12/24  Discharge Plan and Services Additional resources added to the After Visit Summary for   In-house Referral: Clinical Social Work              DME Arranged: N/A DME Agency: NA                  Social Drivers of Health (SDOH) Interventions SDOH Screenings   Food Insecurity: No Food Insecurity (03/09/2024)  Housing: Low Risk (03/09/2024)  Transportation Needs: No Transportation Needs (03/09/2024)  Utilities: Not At Risk (03/09/2024)  Depression (PHQ2-9):  Low Risk (02/12/2024)  Social Connections: Moderately Isolated (03/09/2024)  Tobacco Use: High Risk (03/10/2024)     Readmission Risk Interventions    03/12/2024   10:30 AM  Readmission Risk Prevention Plan  Post Dischage Appt Complete  Medication Screening Complete  Transportation Screening Complete

## 2024-03-13 LAB — CBC
HCT: 25.8 % — ABNORMAL LOW (ref 39.0–52.0)
Hemoglobin: 8.6 g/dL — ABNORMAL LOW (ref 13.0–17.0)
MCH: 31.7 pg (ref 26.0–34.0)
MCHC: 33.3 g/dL (ref 30.0–36.0)
MCV: 95.2 fL (ref 80.0–100.0)
Platelets: 162 K/uL (ref 150–400)
RBC: 2.71 MIL/uL — ABNORMAL LOW (ref 4.22–5.81)
RDW: 14 % (ref 11.5–15.5)
WBC: 5.5 K/uL (ref 4.0–10.5)
nRBC: 0 % (ref 0.0–0.2)

## 2024-03-13 LAB — BASIC METABOLIC PANEL WITH GFR
Anion gap: 6 (ref 5–15)
BUN: 15 mg/dL (ref 8–23)
CO2: 28 mmol/L (ref 22–32)
Calcium: 8.6 mg/dL — ABNORMAL LOW (ref 8.9–10.3)
Chloride: 103 mmol/L (ref 98–111)
Creatinine, Ser: 0.77 mg/dL (ref 0.61–1.24)
GFR, Estimated: >60 mL/min
Glucose, Bld: 90 mg/dL (ref 70–99)
Potassium: 3.5 mmol/L (ref 3.5–5.1)
Sodium: 138 mmol/L (ref 135–145)

## 2024-03-13 NOTE — Progress Notes (Signed)
 Called report to Sylvia at [facility]. All questions and concerns answered. Patient left via transport. Patient had a black charger plugged into the wall but nothing that sat in the charger.

## 2024-03-13 NOTE — Anesthesia Postprocedure Evaluation (Signed)
"   Anesthesia Post Note  Patient: Eric Hinton  Procedure(s) Performed: FIXATION, FRACTURE, INTERTROCHANTERIC, WITH INTRAMEDULLARY ROD (Left: Hip)  Patient location during evaluation: Phase II Anesthesia Type: General Level of consciousness: awake Pain management: pain level controlled Vital Signs Assessment: post-procedure vital signs reviewed and stable Respiratory status: spontaneous breathing and respiratory function stable Cardiovascular status: blood pressure returned to baseline and stable Postop Assessment: no headache and no apparent nausea or vomiting Anesthetic complications: no Comments: Late entry   No notable events documented.   Last Vitals:  Vitals:   03/13/24 0023 03/13/24 0602  BP: (!) 116/57 (!) 123/59  Pulse:  87  Resp:  19  Temp:  36.6 C  SpO2:  92%    Last Pain:  Vitals:   03/13/24 0612  TempSrc:   PainSc: 7                  Yvonna JINNY Bosworth      "

## 2024-03-23 ENCOUNTER — Encounter: Payer: Self-pay | Admitting: *Deleted

## 2024-05-07 ENCOUNTER — Other Ambulatory Visit (HOSPITAL_COMMUNITY)

## 2024-05-15 ENCOUNTER — Inpatient Hospital Stay: Attending: Hematology | Admitting: Oncology
